# Patient Record
Sex: Male | Born: 1935 | Race: White | Hispanic: No | Marital: Married | Smoking: Former smoker
Health system: Southern US, Community
[De-identification: ages and names within clinical notes are randomized; demographics above are authoritative.]

## PROBLEM LIST (undated history)

## (undated) DIAGNOSIS — F4310 Post-traumatic stress disorder, unspecified: Secondary | ICD-10-CM

## (undated) DIAGNOSIS — I4891 Unspecified atrial fibrillation: Secondary | ICD-10-CM

## (undated) DIAGNOSIS — I714 Abdominal aortic aneurysm, without rupture, unspecified: Secondary | ICD-10-CM

## (undated) DIAGNOSIS — I10 Essential (primary) hypertension: Secondary | ICD-10-CM

## (undated) DIAGNOSIS — I951 Orthostatic hypotension: Secondary | ICD-10-CM

## (undated) DIAGNOSIS — I6529 Occlusion and stenosis of unspecified carotid artery: Secondary | ICD-10-CM

## (undated) DIAGNOSIS — I639 Cerebral infarction, unspecified: Secondary | ICD-10-CM

## (undated) DIAGNOSIS — I499 Cardiac arrhythmia, unspecified: Secondary | ICD-10-CM

## (undated) DIAGNOSIS — G709 Myoneural disorder, unspecified: Secondary | ICD-10-CM

## (undated) DIAGNOSIS — M4800 Spinal stenosis, site unspecified: Secondary | ICD-10-CM

## (undated) DIAGNOSIS — M549 Dorsalgia, unspecified: Secondary | ICD-10-CM

## (undated) DIAGNOSIS — M1990 Unspecified osteoarthritis, unspecified site: Secondary | ICD-10-CM

## (undated) DIAGNOSIS — Z531 Procedure and treatment not carried out because of patient's decision for reasons of belief and group pressure: Secondary | ICD-10-CM

## (undated) DIAGNOSIS — IMO0001 Reserved for inherently not codable concepts without codable children: Secondary | ICD-10-CM

## (undated) DIAGNOSIS — F329 Major depressive disorder, single episode, unspecified: Secondary | ICD-10-CM

## (undated) DIAGNOSIS — N289 Disorder of kidney and ureter, unspecified: Secondary | ICD-10-CM

## (undated) DIAGNOSIS — F32A Depression, unspecified: Secondary | ICD-10-CM

## (undated) DIAGNOSIS — F515 Nightmare disorder: Secondary | ICD-10-CM

## (undated) DIAGNOSIS — C801 Malignant (primary) neoplasm, unspecified: Secondary | ICD-10-CM

## (undated) DIAGNOSIS — E0590 Thyrotoxicosis, unspecified without thyrotoxic crisis or storm: Secondary | ICD-10-CM

## (undated) DIAGNOSIS — E7800 Pure hypercholesterolemia, unspecified: Secondary | ICD-10-CM

## (undated) DIAGNOSIS — G259 Extrapyramidal and movement disorder, unspecified: Secondary | ICD-10-CM

## (undated) DIAGNOSIS — F419 Anxiety disorder, unspecified: Secondary | ICD-10-CM

## (undated) DIAGNOSIS — E785 Hyperlipidemia, unspecified: Secondary | ICD-10-CM

## (undated) DIAGNOSIS — I38 Endocarditis, valve unspecified: Secondary | ICD-10-CM

## (undated) DIAGNOSIS — F0150 Vascular dementia without behavioral disturbance: Secondary | ICD-10-CM

## (undated) DIAGNOSIS — R413 Other amnesia: Secondary | ICD-10-CM

## (undated) DIAGNOSIS — H3530 Unspecified macular degeneration: Secondary | ICD-10-CM

## (undated) HISTORY — DX: Pure hypercholesterolemia, unspecified: E78.00

## (undated) HISTORY — DX: Cardiac arrhythmia, unspecified: I49.9

## (undated) HISTORY — DX: Thyrotoxicosis, unspecified without thyrotoxic crisis or storm: E05.90

## (undated) HISTORY — PX: CATARACT EXTRACTION W/ INTRAOCULAR LENS  IMPLANT, BILATERAL: SHX1307

## (undated) HISTORY — PX: EYE SURGERY: SHX253

## (undated) HISTORY — DX: Orthostatic hypotension: I95.1

## (undated) HISTORY — DX: Reserved for inherently not codable concepts without codable children: IMO0001

## (undated) HISTORY — PX: JOINT REPLACEMENT: SHX530

## (undated) HISTORY — DX: Cerebral infarction, unspecified: I63.9

## (undated) HISTORY — DX: Abdominal aortic aneurysm, without rupture, unspecified: I71.40

## (undated) HISTORY — DX: Essential (primary) hypertension: I10

## (undated) HISTORY — DX: Unspecified osteoarthritis, unspecified site: M19.90

## (undated) HISTORY — PX: FRACTURE SURGERY: SHX138

## (undated) HISTORY — DX: Disorder of kidney and ureter, unspecified: N28.9

## (undated) HISTORY — DX: Vascular dementia without behavioral disturbance: F01.50

## (undated) HISTORY — DX: Endocarditis, valve unspecified: I38

## (undated) HISTORY — DX: Abdominal aortic aneurysm, without rupture: I71.4

## (undated) HISTORY — DX: Extrapyramidal and movement disorder, unspecified: G25.9

## (undated) HISTORY — DX: Other amnesia: R41.3

## (undated) HISTORY — DX: Hyperlipidemia, unspecified: E78.5

## (undated) HISTORY — DX: Dorsalgia, unspecified: M54.9

## (undated) HISTORY — DX: Occlusion and stenosis of unspecified carotid artery: I65.29

## (undated) HISTORY — DX: Unspecified atrial fibrillation: I48.91

---

## 1999-06-06 HISTORY — PX: TOTAL KNEE ARTHROPLASTY: SHX125

## 2000-09-09 ENCOUNTER — Encounter: Admission: RE | Admit: 2000-09-09 | Discharge: 2000-09-09 | Payer: Self-pay | Admitting: Geriatric Medicine

## 2000-09-09 ENCOUNTER — Encounter: Payer: Self-pay | Admitting: Geriatric Medicine

## 2000-10-05 HISTORY — PX: ABDOMINAL AORTIC ANEURYSM REPAIR: SUR1152

## 2000-10-13 ENCOUNTER — Encounter: Admission: RE | Admit: 2000-10-13 | Discharge: 2000-10-13 | Payer: Self-pay | Admitting: Vascular Surgery

## 2000-10-13 ENCOUNTER — Encounter: Payer: Self-pay | Admitting: Vascular Surgery

## 2000-10-29 ENCOUNTER — Encounter: Payer: Self-pay | Admitting: Vascular Surgery

## 2000-11-01 ENCOUNTER — Ambulatory Visit (HOSPITAL_COMMUNITY): Admission: RE | Admit: 2000-11-01 | Discharge: 2000-11-01 | Payer: Self-pay | Admitting: Vascular Surgery

## 2000-11-03 ENCOUNTER — Encounter: Payer: Self-pay | Admitting: Vascular Surgery

## 2000-11-03 ENCOUNTER — Inpatient Hospital Stay (HOSPITAL_COMMUNITY): Admission: RE | Admit: 2000-11-03 | Discharge: 2000-11-08 | Payer: Self-pay | Admitting: Vascular Surgery

## 2000-11-03 ENCOUNTER — Encounter (INDEPENDENT_AMBULATORY_CARE_PROVIDER_SITE_OTHER): Payer: Self-pay | Admitting: Specialist

## 2000-11-04 ENCOUNTER — Encounter: Payer: Self-pay | Admitting: Vascular Surgery

## 2001-09-27 ENCOUNTER — Encounter: Payer: Self-pay | Admitting: Orthopedic Surgery

## 2001-09-27 ENCOUNTER — Ambulatory Visit (HOSPITAL_COMMUNITY): Admission: RE | Admit: 2001-09-27 | Discharge: 2001-09-27 | Payer: Self-pay | Admitting: Orthopedic Surgery

## 2001-10-05 HISTORY — PX: WRIST SURGERY: SHX841

## 2001-10-21 ENCOUNTER — Encounter: Admission: RE | Admit: 2001-10-21 | Discharge: 2001-10-21 | Payer: Self-pay | Admitting: Geriatric Medicine

## 2001-10-21 ENCOUNTER — Encounter: Payer: Self-pay | Admitting: Geriatric Medicine

## 2002-10-05 HISTORY — PX: CAROTID ENDARTERECTOMY: SUR193

## 2003-06-08 ENCOUNTER — Encounter: Payer: Self-pay | Admitting: Vascular Surgery

## 2003-06-13 ENCOUNTER — Ambulatory Visit (HOSPITAL_COMMUNITY): Admission: RE | Admit: 2003-06-13 | Discharge: 2003-06-13 | Payer: Self-pay | Admitting: Vascular Surgery

## 2003-06-18 ENCOUNTER — Encounter (INDEPENDENT_AMBULATORY_CARE_PROVIDER_SITE_OTHER): Payer: Self-pay | Admitting: *Deleted

## 2003-06-18 ENCOUNTER — Inpatient Hospital Stay (HOSPITAL_COMMUNITY): Admission: RE | Admit: 2003-06-18 | Discharge: 2003-06-19 | Payer: Self-pay | Admitting: Vascular Surgery

## 2006-12-04 HISTORY — PX: REPLACEMENT TOTAL KNEE: SUR1224

## 2006-12-08 ENCOUNTER — Inpatient Hospital Stay (HOSPITAL_COMMUNITY): Admission: RE | Admit: 2006-12-08 | Discharge: 2006-12-12 | Payer: Self-pay | Admitting: Orthopedic Surgery

## 2007-03-14 ENCOUNTER — Ambulatory Visit: Payer: Self-pay | Admitting: Vascular Surgery

## 2008-04-13 ENCOUNTER — Emergency Department (HOSPITAL_COMMUNITY): Admission: EM | Admit: 2008-04-13 | Discharge: 2008-04-13 | Payer: Self-pay | Admitting: Family Medicine

## 2008-06-01 ENCOUNTER — Ambulatory Visit: Payer: Self-pay | Admitting: Vascular Surgery

## 2008-12-07 ENCOUNTER — Ambulatory Visit: Payer: Self-pay | Admitting: Vascular Surgery

## 2009-04-08 ENCOUNTER — Emergency Department (HOSPITAL_COMMUNITY): Admission: EM | Admit: 2009-04-08 | Discharge: 2009-04-08 | Payer: Self-pay | Admitting: Emergency Medicine

## 2009-04-26 ENCOUNTER — Inpatient Hospital Stay (HOSPITAL_COMMUNITY): Admission: RE | Admit: 2009-04-26 | Discharge: 2009-04-29 | Payer: Self-pay | Admitting: Orthopedic Surgery

## 2009-05-10 ENCOUNTER — Emergency Department (HOSPITAL_COMMUNITY): Admission: EM | Admit: 2009-05-10 | Discharge: 2009-05-10 | Payer: Self-pay | Admitting: Emergency Medicine

## 2009-05-31 ENCOUNTER — Ambulatory Visit: Payer: Self-pay | Admitting: Vascular Surgery

## 2009-11-22 ENCOUNTER — Ambulatory Visit: Payer: Self-pay | Admitting: Vascular Surgery

## 2010-11-25 ENCOUNTER — Other Ambulatory Visit (INDEPENDENT_AMBULATORY_CARE_PROVIDER_SITE_OTHER): Payer: Federal, State, Local not specified - PPO

## 2010-11-25 DIAGNOSIS — Z48812 Encounter for surgical aftercare following surgery on the circulatory system: Secondary | ICD-10-CM

## 2010-11-25 DIAGNOSIS — I6529 Occlusion and stenosis of unspecified carotid artery: Secondary | ICD-10-CM

## 2010-12-02 NOTE — Procedures (Unsigned)
CAROTID DUPLEX EXAM  INDICATION:  Follow up right carotid disease, left CEA.  HISTORY: Diabetes:  No. Cardiac:  No. Hypertension:  Yes. Smoking:  Previous. Previous Surgery:  Left CEA 06/17/2005. CV History: Amaurosis Fugax No, Paresthesias No, Hemiparesis No                                      RIGHT             LEFT Brachial systolic pressure:         138               142 Brachial Doppler waveforms:         WNL               WNL Vertebral direction of flow:        Antegrade         Antegrade DUPLEX VELOCITIES (cm/sec) CCA peak systolic                   81                56 ECA peak systolic                   75                128 ICA peak systolic                   210               37 ICA end diastolic                   51                12 PLAQUE MORPHOLOGY:                  Heterogeneous     NA PLAQUE AMOUNT:                      Moderate          Minimal PLAQUE LOCATION:                    ICA  IMPRESSION: 1. High-end, 40% to 59% right internal carotid artery stenosis. 2. Widely patent left carotid endarterectomy without evidence of     restenosis or hyperplasia. 3. Bilateral vertebral arteries are within normal limits. 4. There has been a downgrade on the right side classification of     stenosis; however, velocities appear stable.  ___________________________________________ Larina Earthly, M.D.  LT/MEDQ  D:  11/25/2010  T:  11/25/2010  Job:  [id number]

## 2011-01-10 LAB — URINALYSIS, ROUTINE W REFLEX MICROSCOPIC
Glucose, UA: NEGATIVE mg/dL
Hgb urine dipstick: NEGATIVE
Specific Gravity, Urine: 1.015 (ref 1.005–1.030)
Urobilinogen, UA: 0.2 mg/dL (ref 0.0–1.0)
pH: 6 (ref 5.0–8.0)

## 2011-01-10 LAB — CBC
HCT: 32.3 % — ABNORMAL LOW (ref 39.0–52.0)
MCHC: 33.2 g/dL (ref 30.0–36.0)
MCV: 90.1 fL (ref 78.0–100.0)
Platelets: 376 10*3/uL (ref 150–400)
RBC: 3.58 MIL/uL — ABNORMAL LOW (ref 4.22–5.81)
WBC: 7.9 10*3/uL (ref 4.0–10.5)

## 2011-01-10 LAB — POCT I-STAT, CHEM 8
Calcium, Ion: 1.17 mmol/L (ref 1.12–1.32)
Creatinine, Ser: 1.3 mg/dL (ref 0.4–1.5)
Glucose, Bld: 112 mg/dL — ABNORMAL HIGH (ref 70–99)
HCT: 33 % — ABNORMAL LOW (ref 39.0–52.0)
Hemoglobin: 11.2 g/dL — ABNORMAL LOW (ref 13.0–17.0)
TCO2: 25 mmol/L (ref 0–100)

## 2011-01-10 LAB — PROTIME-INR
INR: 2.3 — ABNORMAL HIGH (ref 0.00–1.49)
Prothrombin Time: 25.2 seconds — ABNORMAL HIGH (ref 11.6–15.2)

## 2011-01-10 LAB — DIFFERENTIAL
Basophils Relative: 0 % (ref 0–1)
Eosinophils Absolute: 0.2 10*3/uL (ref 0.0–0.7)
Eosinophils Relative: 2 % (ref 0–5)
Lymphs Abs: 0.7 10*3/uL (ref 0.7–4.0)
Monocytes Relative: 6 % (ref 3–12)

## 2011-01-11 LAB — BASIC METABOLIC PANEL
CO2: 29 mEq/L (ref 19–32)
Chloride: 104 mEq/L (ref 96–112)
GFR calc Af Amer: >60 mL/min (ref >60)
GFR calc non Af Amer: >60 mL/min (ref >60)
Glucose, Bld: 98 mg/dL (ref 70–99)
Potassium: 3.5 mEq/L (ref 3.5–5.1)
Potassium: 4.4 mEq/L (ref 3.5–5.1)
Sodium: 140 mEq/L (ref 135–145)

## 2011-01-11 LAB — CBC
HCT: 25.5 % — ABNORMAL LOW (ref 39.0–52.0)
HCT: 27.4 % — ABNORMAL LOW (ref 39.0–52.0)
HCT: 32.3 % — ABNORMAL LOW (ref 39.0–52.0)
Hemoglobin: 10.7 g/dL — ABNORMAL LOW (ref 13.0–17.0)
Hemoglobin: 13.5 g/dL (ref 13.0–17.0)
Hemoglobin: 9.3 g/dL — ABNORMAL LOW (ref 13.0–17.0)
MCHC: 33.9 g/dL (ref 30.0–36.0)
MCV: 88.8 fL (ref 78.0–100.0)
MCV: 88.9 fL (ref 78.0–100.0)
Platelets: 125 10*3/uL — ABNORMAL LOW (ref 150–400)
RBC: 3.64 MIL/uL — ABNORMAL LOW (ref 4.22–5.81)
RBC: 4.61 MIL/uL (ref 4.22–5.81)
RDW: 14.6 % (ref 11.5–15.5)
RDW: 14.6 % (ref 11.5–15.5)
WBC: 10.6 10*3/uL — ABNORMAL HIGH (ref 4.0–10.5)
WBC: 5.9 10*3/uL (ref 4.0–10.5)
WBC: 7.4 10*3/uL (ref 4.0–10.5)

## 2011-01-11 LAB — NO BLOOD PRODUCTS

## 2011-01-11 LAB — PROTIME-INR
Prothrombin Time: 14.4 seconds (ref 11.6–15.2)
Prothrombin Time: 14.4 seconds (ref 11.6–15.2)

## 2011-01-11 LAB — URINALYSIS, ROUTINE W REFLEX MICROSCOPIC
Glucose, UA: NEGATIVE mg/dL
Hgb urine dipstick: NEGATIVE
Protein, ur: NEGATIVE mg/dL
Specific Gravity, Urine: 1.009 (ref 1.005–1.030)

## 2011-01-11 LAB — COMPREHENSIVE METABOLIC PANEL
ALT: 17 U/L (ref 0–53)
Alkaline Phosphatase: 87 U/L (ref 39–117)
Chloride: 108 mEq/L (ref 96–112)
Glucose, Bld: 90 mg/dL (ref 70–99)
Potassium: 4.1 mEq/L (ref 3.5–5.1)
Sodium: 139 mEq/L (ref 135–145)
Total Bilirubin: 0.9 mg/dL (ref 0.3–1.2)
Total Protein: 6.7 g/dL (ref 6.0–8.3)

## 2011-02-17 NOTE — Assessment & Plan Note (Signed)
OFFICE VISIT   [name] [name], [name] [name]  DOB:  1923-08-26                                       06/01/2008  BJYNW#:[id number]   Patient presents today for continued followup of his asymptomatic  carotid disease.  He is status post left carotid endarterectomy in  September, 2006.  He is his usual pleasant, animated self, and he denies  any new medical difficulties.  He does have some difficulty with his  right knee and is considering surgery on this.  He has had successful  treatment for left knee discomfort.  He specifically denies any  amaurosis fugax, transient ischemic attack, or stroke.   PHYSICAL EXAMINATION:  Blood pressure 138/71, pulse 81, respirations 18.  He is grossly intact neurologically.  His radial pulses are 2+  bilaterally.  He has a well-healed left carotid incision with no bruits,  and he has no bruits on his right carotid as well.   He underwent carotid duplex evaluation in our office.  This reveals a  widely patent endarterectomy on the left, and no significant stenosis of  his moderate-to-severe stenosis in his right carotid system.  He has a  60-79% narrowing.   I again discussed symptoms of carotid disease with the patientwho I  advised to notify us should this occur, otherwise we will see him again  in one year with continued carotid duplex surveillance.   Larina Earthly, M.D.  Electronically Signed   TFE/MEDQ  D:  06/01/2008  T:  06/04/2008  Job:  [id number]

## 2011-02-17 NOTE — Discharge Summary (Signed)
NAME:  MONICO, SUDDUTH NO.:  [id number]   MEDICAL RECORD NO.:  [id number]          PATIENT TYPE:  INP   LOCATION:  1608                         FACILITY:  [facility]   PHYSICIAN:  Ollen Gross, M.D.    DATE OF BIRTH:  10-30-1922   DATE OF ADMISSION:  04/26/2009  DATE OF DISCHARGE:  04/29/2009                               DISCHARGE SUMMARY   ADMITTING DIAGNOSES:  1. Osteoarthritis, right knee.  2. Tinnitus.  3. Vertigo.  4. Early macular degeneration.  5. Anxiety.  6. Depression.  7. Hypertension.  8. Hypercholesterolemia.  9. Abdominal aortic aneurysm which has been repaired.  10.Childhood illnesses of measles and mumps.  11.Hemorrhoids.  12.Carotid arterial disease, status post left carotid surgery.   DISCHARGE DIAGNOSES:  1. Osteoarthritis, right knee, status post right total knee      replacement arthroplasty.  2. Postoperative acute blood loss anemia, did not require transfusion.  3. Postoperative hyponatremia, improved.  4. Tinnitus.  5. Vertigo.  6. Early macular degeneration.  7. Anxiety.  8. Depression.  9. Hypertension.  10.Hypercholesterolemia.  11.Abdominal aortic aneurysm which has been repaired.  12.Childhood illnesses of measles and mumps.  13.Hemorrhoids.  14.Carotid arterial disease, status post left carotid surgery.   PROCEDURE:  April 26, 2009, right total knee.  Surgeon, Dr. Lequita Halt.  Assistant, Avel Peace, P.A.-C.  Spinal anesthesia.  [place]et time, 38  minutes.   CONSULTS:  None.   BRIEF HISTORY:  Mr. Lipscomb is an 86-year male with end-stage arthritis  of the right knee, progressive worsening pain and dysfunction, failed  nonoperative management, now presents for total knee arthroplasty.   LABORATORY DATA:  Preoperative CBC showed a hemoglobin of 13.5,  hematocrit of 41.0, white cell count 7.4, platelets 173.  Postoperative  hemoglobin 10.7 then 9.3, last noted hemoglobin and hematocrit 8.6 and  25.5.  PT/PTT preoperative  14.6 and 1.1 with PTT of 31.  Serial pro  times followed per Coumadin protocol.  Last noted PT/INR 19.1 and 1.5.  Chem panel on admission all within normal limits.  Serial BMETs were  followed.  Sodium dropped from 139 to 134 back up to 140.  Remaining  BMET within normal limits.  Preoperative UA negative.   EKG, April 24, 2009, sinus rhythm with premature atrial complexes  confirmed by Dr. Lady Deutscher.  Two-view chest, April 24, 2009, chronic  lung markings, atherosclerosis noted, mild cardiomegaly, no acute  process evident.   HOSPITAL COURSE:  Patient admitted to [facility], taken to the  OR, underwent above-stated procedure without complication.  Patient  tolerated procedure well and later transferred to recovery room,  orthopedic floor.  Started on PCA and p.o. analgesics within 24 hours  postoperative IV antibiotics and doing pretty well on the morning of day  1.  Hemoglobin was down a little bit but hemodynamically stable.  Hemovac drain placed __________ without difficulty.  Started getting up  out of bed with therapy.  On day 1, patient was up walking about 18  feet.  By day 2, the dressing change and incision looked good and he had  progressed  with his therapy up to about 65 feet.  Hemoglobin was down a  little bit further at 9.3 but he was asymptomatic with this and  hemodynamically stable.  Continued to progress well with therapy.  Seen  on the morning of day 3.  He was doing better, meeting his goals,  tolerating his medications.  Hemoglobin was low but he was asymptomatic.  Placed him on iron supplementation and discharged home.   DISCHARGE PLAN:  1. Patient was discharged home on April 29, 2009.  2. Discharge diagnoses:  Please see above.  3. Discharge medications:  Vicodin, Robaxin, Nu-Iron, Coumadin.   FOLLOWUP:  Two weeks.   ACTIVITY:  Weightbearing as tolerated, total knee protocol.   DISPOSITION:  Home.   CONDITION UPON DISCHARGE:  Improved.       Alexzandrew L. Perkins, P.A.C.      Ollen Gross, M.D.  Electronically Signed    ALP/MEDQ  D:  05/30/2009  T:  05/30/2009  Job:  [id number]   cc:   Ollen Gross, M.D.  Fax: [phone]

## 2011-02-17 NOTE — Procedures (Signed)
CAROTID DUPLEX EXAM   INDICATION:  Followup carotid artery disease.   HISTORY:  Diabetes:  No.  Cardiac:  No.  Hypertension:  Yes.  Smoking:  Quit.  Previous Surgery:  Left CEA with primary closure on 06/17/2005 by Dr.  Arbie Cookey.  CV History:  No.  Amaurosis Fugax No, Paresthesias No, Hemiparesis No                                       RIGHT             LEFT  Brachial systolic pressure:         160               154  Brachial Doppler waveforms:         Triphasic         Triphasic  Vertebral direction of flow:        Antegrade         Antegrade  DUPLEX VELOCITIES (cm/sec)  CCA peak systolic                   66                71  ECA peak systolic                   213               154  ICA peak systolic                   348               77  ICA end diastolic                   79                18  PLAQUE MORPHOLOGY:                  Calcified         Homogenous  PLAQUE AMOUNT:                      Moderate/severe   Mild  PLAQUE LOCATION:                    ICA/ECA           ICA/ECA   IMPRESSION:  1. Right ICA shows evidence of 60-79% stenosis, an increase in      velocity, however no change in category.  2. Left ICA shows evidence of 1-39% stenosis status post CEA.  3. Right ECA stenosis.   ___________________________________________  Larina Earthly, M.D.   AS/MEDQ  D:  06/01/2008  T:  06/01/2008  Job:  [id number]

## 2011-02-17 NOTE — Consult Note (Signed)
NAME:  [name] [name], [name] [name] NO.:  [id number]   MEDICAL RECORD NO.:  [id number]          PATIENT TYPE:  EMS   LOCATION:  MAJO                         FACILITY:  [facility]   PHYSICIAN:  Lonia Blood, M.D.       DATE OF BIRTH:  May 12, 1923   DATE OF CONSULTATION:  05/10/2009  DATE OF DISCHARGE:                                 CONSULTATION   CHIEF COMPLAINT:  Passed out.   HISTORY OF PRESENT ILLNESS:  Mr. Guice is an 77 year old gentleman  with obvious atherosclerosis who was going for a follow-up after total  knee replacement to Dr. Tana Felts office when he got dizzy in the parking  lot and fell backwards, hitting his head.  The patient did not have any  seizure activity and recovered fairly quickly and was brought to the  emergency room where he remained stable throughout evaluation.  He  denies any preceding chest pain or shortness of breath and currently he  says he feels fine and wants to go home.   PAST MEDICAL HISTORY:  1. Hypertension.  2. Abdominal aortic aneurysm, status post repair in 2002.  3. Carotid artery disease, status post left endarterectomy in 2004.  4. Right ICA stenosis.  5. Osteoarthritis.  6. Macular degeneration.  7. Anxiety, depression.   HOME MEDICATIONS:  1. Norvasc 5 mg daily.  2. Lasix 40 mg daily.  3. Vytorin 10-80 mg daily.  4. Coumadin 5 mg daily.  5. Robaxin 500 mg as needed for muscle spasm.  6. Vicodin 5/325 as needed for pain.  7. Altace 5 mg daily.  8. Iron 150 mg twice a day.  9. Lexapro 20 mg daily.   ALLERGIES:  NO KNOWN DRUG ALLERGIES.   FAMILY HISTORY:  The patient's father died with a myocardial infarction.  Mother died with stroke and myocardial infarction.  Brother died with  myocardial infarction.  Sister died with myocardial infarction.   SOCIAL HISTORY:  The patient is married.  Does not smoke cigarettes,  does not drink alcohol.   REVIEW OF SYSTEMS:  As per HPI.  All other systems reviewed and  negative.   PHYSICAL EXAM:  VITAL SIGNS:  Upon admission his temperature is 97.0,  heart rate 67, blood pressure 99/60, respiratory rate 17, saturation 99%  on room air.  GENERAL APPEARANCE:  Is one of an elderly gentleman that is awake,  alert, in no acute distress.  HEENT:  Head has an abrasion on the left occipital region but without  any clear skin tear.  Otherwise he is normocephalic.  EYES:  Pupils  round, react to light and accommodation.  Extraocular movements intact.  Throat clear.  NECK:  Supple, no  JVD.  Examination of the neck reveals a right-sided  carotid bruit.  CHEST:  Clear to auscultation without wheezes, rhonchi or crackles.  HEART:  Regular without murmurs, rubs or gallops.  ABDOMEN:  Soft, nontender and nondistended.  Bowel sounds are present.  EXTREMITIES:  Right-knee incision is healed.  NEUROLOGICAL EXAM:  Cranial nerves II through XII are intact.  Strength  5/5 in the four extremities.  Sensation intact.   LABORATORY VALUES:  On admission white blood cell count 7.9, hemoglobin  10.7, platelet count 376.  Sodium 140, potassium 3.9, chloride 104,  bicarbonate 25, BUN 24, creatinine 1.3, glucose 112, INR is 2.3.  Cardiac point of markers are within normal limits.  CT head and spine  are within normal limits.  EKG shows normal sinus rhythm, no ST-T  changes, question old inferior MI.   ASSESSMENT/PLAN:  This is an 77 year old gentleman with obvious syncopal  event, which is unclear in etiology but I wonder if it was precipitated  by the heat and hypotension as the patient probably has some  postoperative anemia and is on three antihypertensives.  The patient is  obviously at high risk for ventricular arrhythmias due to his  atherosclerosis, presumed coronary artery disease and advanced age.  Mr.  Darko has been urged to stay in the hospital to complete the syncopal  evaluation, but he has been firmly refusing.  His wife, present in the  room, confirms that there is no  way we can change this gentleman's mind.  The patient was asked to continue his Coumadin and hold the Lasix,  Altace and Norvasc and a follow-up was arranged with [facility]  with Dr. Donato Schultz on May 15, 2009 at 10:00 a.m. The patient's case  was discussed with requesting physician for the consult, Dr. Renato Battles, III.      Lonia Blood, M.D.  Electronically Signed     SL/MEDQ  D:  05/10/2009  T:  05/10/2009  Job:  [id number]   cc:   Jake Bathe, MD  Hal T. Stoneking, M.D.

## 2011-02-17 NOTE — H&P (Signed)
NAME:  [name] [name], [name] [name] NO.:  [id number]   MEDICAL RECORD NO.:  [id number]          PATIENT TYPE:  INP   LOCATION:  NA                           FACILITY:  [facility]   PHYSICIAN:  Ollen Gross, M.D.    DATE OF BIRTH:  Jun 21, 1923   DATE OF ADMISSION:  04/26/2009  DATE OF DISCHARGE:                              HISTORY & PHYSICAL   DATE OF OFFICE VISIT/HISTORY AND PHYSICAL:  March 29, 2009.   DATE OF ADMISSION:  April 26, 2009.   CHIEF COMPLAINT:  Right knee pain.   HISTORY OF PRESENT ILLNESS:  The patient is an 77 year old male who has  ongoing progressive right knee arthritis seen by Dr. Lequita Halt.  Felt to  be a good candidate for knee replacement.  He has had a previous left  total knee done back in March 2008, doing well, now presents for the  other side.   ALLERGIES:  NO KNOWN DRUG ALLERGIES.   CURRENT MEDICATIONS:  Altace, Norvasc, cyclobenzaprine, Vytorin,  Lexapro, aspirin.   PAST MEDICAL HISTORY:  Tinnitus, vertigo, early macular degeneration,  anxiety, depression, hypertension, hypercholesterolemia, abdominal  aortic aneurysm, which has been repaired, childhood illnesses of  measles, mumps, hemorrhoids, carotid arterial disease, status post left  carotid surgery.   PAST SURGICAL HISTORY:  Abdominal aortic aneurysm repair January 2002,  left carotid arterial surgery, September 2004, wrist surgery 2003, left  total knee replacement, 2008.   FAMILY HISTORY:  Father deceased, 65, with heart attack.  Mother,  deceased age 35, stroke, arthritis, heart attack and hypertension.  Brother with heart attack.  Sister with heart attack.  Aunt with  diabetes.   SOCIAL HISTORY:  Married, retired nonsmoker.  Two to three beers a day.  Occasional glass of wine a day.  Five children.   REVIEW OF SYSTEMS:  GENERAL:  Occasional fevers, chills, and loss of  memory.  NEURO:  He has had some dizziness, a history of vertigo, and  tinnitus.  No seizures, syncope.   RESPIRATORY:  No shortness of breath,  productive cough, or hemoptysis.  CARDIOVASCULAR:  No chest pain,  angina, or orthopnea.  GI:  Some constipation.  No nausea, vomiting, or  diarrhea.  GU:  No dysuria, hematuria, or discharge.  MUSCULOSKELETAL:  Knee pain.   PHYSICAL EXAMINATION:  VITAL SIGNS:  Pulse 72, respirations 14, blood  pressure 122/50.  GENERAL: An 77 year old white male well-nourished, well-developed, no  acute distress.  He is alert and cooperative, pleasant accompanied by  his wife.  HEENT: Normocephalic, atraumatic.  Pupils are reactive.  EOMs intact.  No glasses.  Has dentures.  NECK: Supple with a faint left and +1 right carotid bruit.  CHEST: Clear anterior posterior chest walls.  No rhonchi, rales or  wheezing.  HEART: Regular rate and rhythm with a grade 2-3/6 systolic ejection  murmur best heard over aortic, pulmonic, and Erb's point.  ABDOMEN:  Soft, slightly round.  Bowel sounds present.  RECTAL/BREAST/GENITALIA:  Not done, not pertinent to present illness.  EXTREMITIES:  Right knee:  Range of motion 5/125, marked crepitus tender  more medial than lateral.  No instability.   IMPRESSION:  Osteoarthritis, right knee.   PLAN:  The patient was admitted to [facility]  to undergo  right total knee replacement arthroplasty.  Due to religious reasons,  the patient does not want to receive any blood, but he does say that  other components such as the CellSaver, the Autovac, and the FloSeal are  usable.      Alexzandrew L. Perkins, P.A.C.      Ollen Gross, M.D.  Electronically Signed    ALP/MEDQ  D:  04/25/2009  T:  04/25/2009  Job:  [id number]

## 2011-02-17 NOTE — Assessment & Plan Note (Signed)
OFFICE VISIT   [name] [name], [name] [name]  DOB:  March 14, 1923                                       11/22/2009  LKGMW#:[id number]   Patient presents today for continued follow-up of his cerebrovascular  occlusive disease.  He is status post resection of abdominal aortic  aneurysm with myself in 2002 and a subsequent left carotid  endarterectomy in 2004.  He remains quite active at his age of 22.  He  does not have any neurologic deficits.  He does have some concern  regarding a thickening around the level of his scar and his umbilicus.  He does not have any specific pain related to this.   His past history is otherwise unchanged.  He has had knee replacements  but does not have any cardiac disease.  He is hypertensive.  He does not  smoke, having quit in the distant past.   PHYSICAL EXAMINATION:  A well-nourished white male appearing his stated  age of 89.  He is grossly intact neurologically with no focal deficits.  His extremities are without deformity or cyanosis.  Skin is without  rashes.  His carotid incision is well healed on the left.  He has no  bruits.  Abdominal exam reveals no evidence of hernia.  He does have  some slight thickening in the area of his scar at the umbilicus, which  is of no consequence.   He did undergo noninvasive vascular laboratory studies in our office  today, and this reveals a widely patent endarterectomy on his left  carotid.  He does have moderate-to-severe right carotid stenosis, and  this has not progressed.  I discussed this at length with patient and  his wife present, and I have suggested that we see them again in 1 year  with repeat carotid duplex for continued follow-up of his asymptomatic  right carotid stenosis.  He will notify us should he develop any  difficulty in the interim.     Larina Earthly, M.D.  Electronically Signed   TFE/MEDQ  D:  11/22/2009  T:  11/26/2009  Job:  [id number]   cc:   Hal T. Stoneking, M.D.

## 2011-02-17 NOTE — Procedures (Signed)
CAROTID DUPLEX EXAM   INDICATION:  Carotid disease.   HISTORY:  Diabetes:  No.  Cardiac:  No.  Hypertension:  Yes.  Smoking:  Previous.  Previous Surgery:  Left carotid endarterectomy on 06/17/2005.  CV History:  Occasional dizziness, generally asymptomatic.  Amaurosis Fugax No, Paresthesias No, Hemiparesis No.                                       RIGHT             LEFT  Brachial systolic pressure:         122               126  Brachial Doppler waveforms:         Normal            Normal  Vertebral direction of flow:        Antegrade         Antegrade  DUPLEX VELOCITIES (cm/sec)  CCA peak systolic                   68                66  ECA peak systolic                   115               121  ICA peak systolic                   303               59  ICA end diastolic                   49                18  PLAQUE MORPHOLOGY:                  Calcific          Mixed  PLAQUE AMOUNT:                      Moderate/severe   Mild  PLAQUE LOCATION:                    ICA/ECA/CCA       ECA/CCA   IMPRESSION:  1. Low-end 60% to 79% stenosis of the right internal carotid artery.  2. Patent left carotid endarterectomy site with no evidence of      internal carotid artery stenosis.  3. No significant change in the Doppler velocities noted when compared      to the previous exam on 12/07/2008.   ___________________________________________  Larina Earthly, M.D.   CH/MEDQ  D:  05/31/2009  T:  05/31/2009  Job:  [id number]

## 2011-02-17 NOTE — Procedures (Signed)
CAROTID DUPLEX EXAM   INDICATION:  Carotid disease.   HISTORY:  Diabetes:  No.  Cardiac:  No.  Hypertension:  Yes.  Smoking:  Previous.  Previous Surgery:  Left carotid endarterectomy on 06/17/2005.  CV History:  Occasional dizziness, generally asymptomatic.  Amaurosis Fugax No, Paresthesias No, Hemiparesis No                                       RIGHT             LEFT  Brachial systolic pressure:         142               144  Brachial Doppler waveforms:         Normal            Normal  Vertebral direction of flow:        Antegrade         Antegrade  DUPLEX VELOCITIES (cm/sec)  CCA peak systolic                   56                65  ECA peak systolic                   134               93  ICA peak systolic                   232               73  ICA end diastolic                   32                15  PLAQUE MORPHOLOGY:                  Mixed             Mixed  PLAQUE AMOUNT:                      Moderate          Mild  PLAQUE LOCATION:                    ICA / ECA / CCA   ECA / CCA   IMPRESSION:  1. Low end 60%-79% stenosis of the left internal carotid artery.  2. Patent left carotid endarterectomy site with no left internal      carotid artery stenosis.  3. No significant change noted when compared to the previous exam on      05/31/2009.   ___________________________________________  Larina Earthly, M.D.   CH/MEDQ  D:  11/22/2009  T:  11/23/2009  Job:  [id number]

## 2011-02-17 NOTE — Op Note (Signed)
NAME:  [name], [name] NO.:  [id number]   MEDICAL RECORD NO.:  [id number]          PATIENT TYPE:  INP   LOCATION:  0003                         FACILITY:  [facility]   PHYSICIAN:  Ollen Gross, M.D.    DATE OF BIRTH:  1922-11-28   DATE OF PROCEDURE:  04/26/2009  DATE OF DISCHARGE:                               OPERATIVE REPORT   PREOPERATIVE DIAGNOSIS:  Osteoarthritis, right knee.   POSTOPERATIVE DIAGNOSIS:  Osteoarthritis, right knee.   PROCEDURE:  Right total knee arthroplasty.   SURGEON:  Ollen Gross, M.D.   ASSISTANT:  Avel Peace, P.A.-C.   ANESTHESIA:  Spinal.   ESTIMATED BLOOD LOSS:  Minimal.   DRAINS:  Hemovac x1.   TOURNIQUET TIME:  38 minutes at 300 mmHg.   COMPLICATIONS:  None.   CONDITION:  Stable to recovery.   CLINICAL NOTE:  [name] [name] is an 77 year old male with end-stage  arthritis of the right knee with progressively worsening pain and  dysfunction.  He has failed nonoperative management and presents now for  right total knee arthroplasty.   PROCEDURE IN DETAIL:  After successful administration of spinal  anesthetic, a tourniquet was placed on his right thigh, and his right  lower extremity was prepped and draped in the usual sterile fashion.  Extremity was wrapped in Esmarch, knee flexed, and tourniquet inflated  to 300 mmHg.  Midline incision was made with a 10 blade through  subcutaneous tissue to the level of the extensor mechanism.  A fresh  blade was used make a medial parapatellar arthrotomy.  Soft tissue on  the proximal medial tibia subperiosteally elevated to the joint line  with the knife into the semimembranosus bursa with a Cobb elevator.  Soft tissue laterally is elevated with attention being paid to avoiding  the patellar tendon on tibial tubercle.  The patella subluxed laterally,  knee flexed 90 degrees, and ACL and PCL removed.  Drill was used create  a starting hole in the distal femur, and the canal was  thoroughly  irrigated.  The 5-degree right valgus alignment guide was placed and  referencing off posterior condyles rotations marked and the block pinned  to remove 10 mm off the distal femur.  Distal femoral resection was made  with an oscillating saw.  Sizing blocks placed, and size four was most  appropriate.  Rotations marked off the epicondylar axis.  Size 4 cutting  block was placed, and the anterior, posterior and chamfer cuts were  made.   Tibia subluxed forward and the menisci were removed.  Extramedullary  tibial alignment guide was placed referencing proximally at the medial  aspect of the tibial tubercle and distally along the second metatarsal  axis and tibial crest.  The block was pinned to remove about 2 mm off  the more deficient medial side.  Tibial resection was made with an  oscillating saw.  Size 4 was the most appropriate tibial component, and  the proximal tibia was prepared the modular drill and keel punch for the  size 4.  Femoral preparation was completed with the intercondylar cut.   Size 4  mobile bearing tibial trial, size 4 posterior stabilized femoral  trial and a 12.5-mm posterior stabilized rotating platform insert trial  were placed.  With the 12.5, full extensions achieved with excellent  varus-valgus and anterior-posterior balance throughout full range of  motion.  Patella was everted and thickness measured to be 23 mm.  Freehand resection was taken to 13 mm, 38 template was placed, lug holes  were drilled, trial patella was placed and it tracked normally.  Osteophytes were removed off the posterior femur with the trial in  place.  All trials were removed, and the cut bone surfaces were prepared  with pulsatile lavage.  Cement was mixed, and once ready for  implantation, a size 4 mobile bearing tibial tray, size 4 posterior  stabilized femur and 38 patella were cemented into place and patella was  held with a clamp.  Trial 12.5-mm insert was placed,  knee held in full  extension and all extruded cement removed.  When the cement was fully  hardened, then the permanent 12.5-mm posterior stabilized rotating  platform insert was placed into the tibial tray.  Wound was copiously  irrigated with saline solution, and then the arthrotomy closed over a  Hemovac drain with interrupted #1 PDS.  Flexion against gravity about  140 degrees.  Tourniquet was then released for total time of 38 minutes.  Subcutaneous tissue was closed with interrupted 2-0 Vicryl and  subcuticular running 4-0 Monocryl.  Incision was cleaned and dried, and  Steri-Strips and bulky sterile dressing were applied.  He was then  placed into a knee immobilizer, awakened and transported to recovery in  stable condition.      Ollen Gross, M.D.  Electronically Signed     FA/MEDQ  D:  04/26/2009  T:  04/26/2009  Job:  [id number]

## 2011-02-17 NOTE — Procedures (Signed)
CAROTID DUPLEX EXAM   INDICATION:  Follow-up evaluation of known carotid artery disease.   HISTORY:  Diabetes:  No.  Cardiac:  No.  Hypertension:  Yes.  Smoking:  Patient is a former smoker.  Previous Surgery:  Left carotid endarterectomy with primary closure on  06/17/05 by Dr. Arbie Cookey.  CV History:  Previous duplex on 06/01/08 revealed 60-79% right ICA  stenosis and 1-39% left ICA stenosis, status post endarterectomy.  Amaurosis Fugax No, Paresthesias No, Hemiparesis No.                                       RIGHT             LEFT  Brachial systolic pressure:         116               120  Brachial Doppler waveforms:         Triphasic         Triphasic  Vertebral direction of flow:        Antegrade         Antegrade  DUPLEX VELOCITIES (cm/sec)  CCA peak systolic                   67                69  ECA peak systolic                   166               183  ICA peak systolic                   323               92  ICA end diastolic                   73                31  PLAQUE MORPHOLOGY:                  Calcified, irregular                Mixed  PLAQUE AMOUNT:                      Moderate          Mild  PLAQUE LOCATION:                    Proximal ICA, CCA Proximal ICA, ECA   IMPRESSION:  1. 60-79% right internal carotid artery stenosis.  2. 20-39% left internal carotid artery stenosis, status post      endarterectomy.  3. No significant change from previous study performed on 06/01/08.   ___________________________________________  Larina Earthly, M.D.   MC/MEDQ  D:  12/07/2008  T:  12/07/2008  Job:  [id number]

## 2011-02-20 NOTE — H&P (Signed)
[place]  Patient:    [name] [name], [name] [name]                       MRN: [id number] Adm. Date:  11/03/00 Attending:  Larina Earthly, M.D. Dictator:   Marlowe Kays, P.A. CC:         Hal T. Stoneking, M.D.   History and Physical  DATE OF BIRTH:  1923/08/21  CHIEF COMPLAINT:  Abdominal aortic aneurysm.  HISTORY OF PRESENT ILLNESS:  [name] [name] is a pleasant 77 year old white male referred by Dr. Pete Glatter for evaluation of abdominal aortic aneurysm. The patient presented with back pain to the primary care physician. Ultrasound revealed a 4.5 cm abdominal aortic aneurysm.  A followup CT in January 2002, revealed an increase of this aneurysm, now showing a diameter of 5.2 cm, with no evidence of leak.  Dr. Arbie Cookey recommended to proceed with surgery, although he first will have to undergo an abdominal aortogram, to determine if the patient is a candidate for stent repair.  He denies any abdominal pain at the moment, he denies any nausea, vomiting, or hematochezia. He has chronic constipation.  No back pain.  No hematemesis or claudication symptoms, although he experiences bilateral hip pain with ambulation.  No peripheral edema.  No dysuria or hematuria.  No GERD symptoms.  No shortness of breath or dyspnea on exertion.  The patient is scheduled for surgery, on November 03, 2000.  PAST MEDICAL HISTORY: 1. Abdominal aortic aneurysm. 2. Jehovah Witness. 3. History of carotid artery disease, followed up on ultrasound. 4. Hypertension. 5. Decreased hearing. 6. Benign prostatic hypertrophy. 7. Previous history of pneumonia. 8. Obesity.  PAST SURGICAL HISTORY:  None.  MEDICATIONS: 1. Hydrochlorothiazide 25 mg p.o. q.d. 2. Vioxx p.o. q.d. p.r.n. 3. Norvasc 5 mg p.o. q.d.  ALLERGIES:  No known drug allergies.  REVIEW OF SYSTEMS:  See HPI and past medical history for significant positives, otherwise, no diabetes, kidney disease, TIA, CVA, or ______  fugax. No coronary artery disease.  FAMILY HISTORY:  Mother died at 12 of stroke, father died at 17 of myocardial infarction.  He also had a history of kidney disease.  One sister died at 49 of complications of Parkinsons disease, she also had a history of hypertension and gastrointestinal bleed.  Brother died at 37 of stroke.  SOCIAL HISTORY:  The patient is married, he has four children in good health. He is a retired Curator.  He quit in 1969 one pack a day of cigarettes, which he smoked for about 30 years.  He drinks about a 12 pack of beer a week.  PHYSICAL EXAMINATION:  GENERAL:  Well-developed, well-nourished, 77 year old white male, in no acute distress, alert and oriented x 3.  VITAL SIGNS:  Blood pressure 170/96, pulse 64, respirations 18.  HEENT:  Normocephalic, atraumatic.  Pupils are equal, round and reactive to light and accommodation.  Extraocular movements intact.  There are bright cataracts on funduscopic exam.  No glaucoma or macular degeneration.  NECK:  Supple, no JVD.  Bilateral bruits are heard, more pronounced on the left than on the right.  No lymphadenopathy.  CHEST:  Symmetrical lung inspirations.  LUNGS:  No wheezes, rhonchi, or rales.  No lymphadenopathy.  CARDIOVASCULAR:  Regular rate and rhythm, 2/6 systolic murmur best heard at the right sternal border, the crescendo in intensity towards the axilla, high pitched.  No rubs or gallops.  ABDOMEN:  Soft, obese, nontender, bowel sounds x 4,  no masses palpable, although there is the presence of a ventral hernia.  There is a pulsatile mass, cannot be palpated, no abdominal bruits heard.  GASTROURINARY:  Deferred.  RECTAL:  Deferred.  EXTREMITIES:  No clubbing, cyanosis, or edema.  SKIN:  No ulcerations and warm temperature.  NEUROLOGIC:  Peripheral pulses show carotids 2+ bilaterally, femorals, popliteal, dorsalis pedis, and posterior tibials are 2+ bilaterally. Nonfocal.  Gait steady.  DTRs 2+  bilaterally.  Muscle strength 5/5.  ASSESSMENT AND PLAN:  A 77 year old white male with a known history of abdominal aortic aneurysm who will undergo an arteriogram on November 01, 2000, in preparation of this aneurysm, to be determined if he will undergo stent repair versus open procedure.  This repair will be scheduled on November 03, 2000.  No blood transfusions are to be given to this patient, for he is a Jehovah Witness.  Dr. Arbie Cookey has seen and evaluated this patient prior to this admission, and has explained the risks and benefits of the procedure, and the patient has agreed to continue. DD:  10/29/00 TD:  10/29/00 Job: [id number]

## 2011-02-20 NOTE — Discharge Summary (Signed)
NAME:  [name] [name], [name] [name] NO.:  [id number]   MEDICAL RECORD NO.:  [id number]          PATIENT TYPE:  INP   LOCATION:  1515                         FACILITY:  [facility]   PHYSICIAN:  Ollen Gross, M.D.    DATE OF BIRTH:  01/13/1923   DATE OF ADMISSION:  12/08/2006  DATE OF DISCHARGE:  12/12/2006                               DISCHARGE SUMMARY   ADMISSION DIAGNOSES:  1. Osteoarthritis, left knee.  2. Valvular heart disease.  3. Hypertension.  4. Abdominal aortic aneurysm, status post repair.  5. Carotid arterial disease, status post left carotid endarterectomy      (also right carotid disease, monitored closely by Dr. Arbie Cookey).   DISCHARGE DIAGNOSES:  1. Osteoarthritis, left knee status post left total knee arthroplasty.  2. Mild postoperative blood loss anemia, not requiring transfusion.  3. Postoperative hyponatremia, improved.  4. Osteoarthritis, left knee.  5. Valvular heart disease.  6. Hypertension.  7. Abdominal aortic aneurysm, status post repair.  8. Carotid arterial disease, status post left carotid endarterectomy      (also right carotid disease, monitored closely by Dr. Arbie Cookey).   PROCEDURES:  December 08, 2006, left total knee arthroplasty.  Surgeon:  Ollen Gross, M.D.  Assistant:  Alexzandrew L. Perkins, P.A.-C.  Spinal  anesthesia.  Tourniquet time 41 minutes.   CONSULTATIONS:  None.   BRIEF HISTORY:  Mr. Corron is an 77 year old male with severe end-  stage arthritis of the left knee.  He has failed nonoperative management  and now presents for a left total knee arthroplasty.   LABORATORY DATA:  Preop CBC showed a hemoglobin of 13.9, hematocrit of  41.7, white cell count of 5.7.  postop hemoglobin 11.5.  Last noted H&H  9.6 and 28.2.  PT/PTT preop 13.8 and 33, respectively.  INR 1.0.  Serial  pro times followed.  Last noted PT and INR of 21.7 and 1.8.  Chemistry  panel on admission:  Elevated creatinine of 1.51, elevated glucose of  123.   Remaining chemistry panel within normal limits.  Serial BMETs were  followed.  Sodium did drop from 142 to 134, back up to 136.  Remaining  electrolytes remained within normal limits.  Creatinine improved down to  a normal level of 1.49.  preop UA negative.   EKG December 02, 2006:  Normal sinus rhythm, normal EKG, confirmed by  Dr. Nanetta Batty.  Compared, no was no change since last tracing of  September 3, 204.  Chest x-ray 2-view November 06, 2006:  Chronic lung  changes, no definite acute overlying pulmonary findings.   HOSPITAL COURSE:  The patient was admitted to [facility] and  tolerated the procedure well, later transferred to recovery room and the  orthopedic floor.  Did have an Autovac placed at time of surgery and was  given his own blood back.  Started on PCA and p.o. analgesics postop.  Was doing well on the morning of day #1, no complaints.  Blood pressure  was stable.  Hemoglobin looked good at 11.5.  discontinued the O2,  started getting up with PT.  Out of bed  by day #2, was doing very well,  no complaints, progressing well with physical therapy.  Was up  ambulating about 50 feet, doing well, excellent output.  Dressing was  changed.  Incision looked good.  He had a little drop in his sodium,  down to 134.  Discontinued his fluids, rechecked BMET.  T was felt he  was doing so well he would probably be ready to go home by the following  day.  The following day sodium did come back up to 136.  He was  tolerating his medications, been seen by weekend coverage.  He continued  to progress with therapy, needed a little bit more time.  Continued with  his PT and had met all of his goals and was ready to go home by the end  of the weekend December 12, 2006.   DISCHARGE PLAN:  Patient discharged home on December 12, 2006.   DISCHARGE DIAGNOSES:  Please see above.   DISCHARGE MEDICATIONS:  Percocet, Robaxin, Coumadin, Nu-Iron.   DIET:  Resume home diet, heart-healthy.    ACTIVITY:  Weightbearing as tolerated.  Total knee protocol.  Home  health PT, home nursing, daily dressing changes.   FOLLOW-UP:  2 weeks from surgery.   DISPOSITION:  Home.   CONDITION UPON DISCHARGE:  Improving.      Alexzandrew L. Julien Girt, P.A.      Ollen Gross, M.D.  Electronically Signed    ALP/MEDQ  D:  01/04/2007  T:  01/04/2007  Job:  [id number]   cc:   Hal T. Stoneking, M.D.  Fax: [phone]   Larina Earthly, M.D.  [address]  [place]  [place] [address]

## 2011-02-20 NOTE — Op Note (Signed)
NAME:  [name] [name], [name] [name]                         ACCOUNT NO.:  [id number]   MEDICAL RECORD NO.:  [id number]                   PATIENT TYPE:  OIB   LOCATION:  2869                                 FACILITY:  [facility]   PHYSICIAN:  Larina Earthly, M.D.                 DATE OF BIRTH:  [birth year]   DATE OF PROCEDURE:  06/13/2003  DATE OF DISCHARGE:                                 OPERATIVE REPORT   PREOPERATIVE DIAGNOSIS:  Severe asymptomatic left internal carotid artery  stenosis.   POSTOPERATIVE DIAGNOSIS:  Severe asymptomatic left internal carotid artery  stenosis.   OPERATION PERFORMED:  Arch and cerebral arteriogram.   SURGEON:  Larina Earthly, M.D.   ASSISTANT:  Nurse.   ANESTHESIA:  1% lidocaine local.   COMPLICATIONS:  None.   DISPOSITION:  To holding area stable.   DESCRIPTION OF PROCEDURE:  The patient was taken to the peripheral vascular  cath lab and placed in supine position where the area of the right groin was  prepped and draped in the usual sterile fashion.  Using local anesthesia and  a single wall puncture, the right limb of the aortofemoral graft was entered  and a guidewire was passed up to the level of the arch.  A pigtail catheter  was positioned at the level of the ascending arch and a 40 degree LAO  projection was undertaken.  This revealed mild irregularity at the take off  of the innominate and left carotid artery. There was significant plaque at  the take off of the left subclavian artery.  This was not flow limiting.  On  the arch injection it was obvious that there was 95+% stenosis of the left  internal carotid artery above the bifurcation.  Next, using a head hunter  catheter, the innominate artery was selected and intracranial and  extracranial right carotid injections were undertaken.  This revealed  moderate 50 to 60% stenosis of the internal carotid artery above the  bifurcation on the right.  Intracranial views were obtained and these will  be  dictated as a separate note from [facility].  Next,  the left carotid artery was selected with a CK catheter and a selected  carotid views were undertaken again showing severe stenosis of the left  internal carotid artery just distal to the bifurcation.  The patient  tolerated the procedure without immediate complications and was transferred  to the holding area in stable condition.   FINDINGS:  Severe 95% stenosis of the internal carotid artery above the  carotid bifurcation on the left, moderate 60% stenosis internal carotid  artery on the right.                                               Kristen Loader.  Early, M.D.    TFE/MEDQ  D:  06/13/2003  T:  06/13/2003  Job:  [id number]

## 2011-02-20 NOTE — Procedures (Signed)
[place]. [facility]  Patient:    [name] [name], [name] [name]                      MRN: [id number] Proc. Date: 11/01/00 Adm. Date:  [id number] Attending:  Alyson Locket CC:         Hal T. Stoneking, M.D.   Procedure Report  PREOPERATIVE DIAGNOSIS:  Abdominal aortic aneurysm.  POSTOPERATIVE DIAGNOSIS:  Abdominal aortic aneurysm.  PROCEDURE:  Aortograms, bilateral lower extremity runoff.  SURGEON:  Larina Earthly, M.D.  ANESTHESIA:  1% lidocaine local.  COMPLICATIONS:  None.  DISPOSITION:  To holding area stable.  DESCRIPTION OF PROCEDURE:  The patient was taken to the peripheral vascular catheterization lab, placed in the supine position, where the area of the right groin was prepped and draped in a sterile fashion.  Using local anesthesia and a single wall puncture, the right common femoral artery was entered.  A guidewire was passed up to the level of the distal external iliac artery, and it could not pass further than this.  The pigtail catheter was exchanged for a Wholey wire, and again this could not be traversed.  A 5 French sheath was passed over the guidewire and using an angled right catheter, the guidewire could be successfully placed up to the level of the suprarenal aorta.  Pigtail catheter was then passed over the guidewire as the guidewire was removed.  One injection was taken through the sheath that revealed high-grade stenosis in the external iliac artery with a great deal of irregularity in the external iliac artery.  The pigtail catheter was then positioned at the level of the suprarenal aorta, and AP projections were undertaken.  This revealed widely patent single renal arteries bilaterally. Lateral projection also revealed widely patent superior mesenteric and celiac arteries.  There was a great deal of mural thrombus in the aneurysm, so the true aneurysm size could not be calculated from this study.  The right renal was a higher  takeoff than the left renal.  There did not appear to be significant stenosis in either renal artery.  The catheter was then withdrawn down into the area above the bifurcation of the aorta, and runoff was obtained through this injection.  The patient did have severe iliac disease with complete occlusion of the external iliac artery by the diagnostic pigtail catheter.  There was collateral filling of the common filling artery, and the superficial femoral and profunda femoris arteries were patent.  On the left, there was marked irregularity throughout the iliac system as well.  The superficial femoral arteries were patent throughout their course with a moderate amount of irregularity at the adductor canal.  The popliteal artery was widely patent.  There was three-vessel runoff bilaterally with the posterior tibial and peroneal arteries being the dominant vessels bilaterally. The patient tolerated the procedure without immediate complications, was transferred to the holding area in stable condition.  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 DD:  11/01/00 TD:  11/01/00 Job: [id number]

## 2011-02-20 NOTE — H&P (Signed)
   NAME:  [name] [name], [name] [name]                         ACCOUNT NO.:  [id number]   MEDICAL RECORD NO.:  [id number]                   PATIENT TYPE:  INP   LOCATION:                                       FACILITY:  [facility]   PHYSICIAN:  Larina Earthly, M.D.                 DATE OF BIRTH:  17-Sep-1923   DATE OF ADMISSION:  06/18/2003  DATE OF DISCHARGE:                                HISTORY & PHYSICAL   PRIMARY CARE PHYSICIAN:  Hal T. Stoneking, M.D.   CHIEF COMPLAINT:  Severe asymptomatic left internal carotid artery stenosis.   HISTORY OF PRESENT ILLNESS:  This is a pleasant 77 year old male referred by  Dr. Brooke Dare to Dr. Arbie Cookey for evaluation of left carotid stenosis.  Dr. Brooke Dare  detected a problem with the left carotid on routine exam in 1998, according  to the patient.  He had an ultrasound and was referred to Dr. Arbie Cookey at that  time.  Dr. Arbie Cookey did not feel that surgery was necessary at that time and  has been following the patient since that time.  The patient had an arch and  cerebral arteriogram on June 13, 2003, which revealed a 95+% stenosis of  the left internal carotid artery above the bifurcation.  The patient does  experience occasional dizziness.  He denies CVA and TIA symptoms, increased  syncope, blackouts, and numbness and tingling.  The patient denies a history  of diabetes mellitus, anemia, DVT, pulmonary embolism, and GERD symptoms.  The patient denies shortness of breath, dyspnea on exertion, cough, weight  gain, and angina.  It is Dr. Bosie Helper opinion that the patient should have a  carotid endarterectomy on September 13, 75.0   PAST MEDICAL HISTORY:  1. Status post AAA in January of 2002.  2. Hypertension.  3. BPH.  4. The patient is a Scientist, product/process development.   PAST SURGICAL HISTORY:  1. AAA repair.  2. Left wrist surgery for repair of fracture in December of 2002.   MEDICATIONS:  1. Norvasc 10 mg one p.o. daily.  2. HCTZ 25 mg one p.o. daily.  3. Altace 25 mg one  p.o. daily.  4. Vioxx 25 mg one p.o. daily.  5. Aspirin 81 mg one p.o. daily.  6. Multivitamins.   ALLERGIES:  No known drug allergies.    FAMILY HISTORY:  Mother positive for hypertension.  Father positive for  myocardial infarction.  Sister positive for Parkinson's disease.  Brother  positive for diabetes mellitus.   Dictation ended at this point.      [name] [name], [place]                      Larina Earthly, M.D.    AY/MEDQ  D:  06/13/2003  T:  06/13/2003  Job:  [id number]

## 2011-02-20 NOTE — H&P (Signed)
NAME:  [name] [name], [name] [name] NO.:  [id number]   MEDICAL RECORD NO.:  [id number]          PATIENT TYPE:  INP   LOCATION:  NA                           FACILITY:  [facility]   PHYSICIAN:  Ollen Gross, M.D.    DATE OF BIRTH:  May 19, 1923   DATE OF ADMISSION:  12/08/2006  DATE OF DISCHARGE:                              HISTORY & PHYSICAL   DATE OF OFFICE VISIT/HISTORY AND PHYSICAL:  December 02, 2006.   DATE OF ADMISSION:  December 08, 2006.   CHIEF COMPLAINT:  Left knee pain.   HISTORY OF PRESENT ILLNESS:  Patient is an 77 year old male who has been  seen by Dr. Lequita Halt for ongoing left knee pain.  He states that both  knees are giving him quite a hard time, but the left knee seems to be  the worst.  It is preventing him from things that he wants to do.  He  has been seen by Dr. Despina Hick and found in the office to have severe end-  stage, tricompartmental arthritis.  The left is much more worse than the  right.  He does have a significant varus deformity and also some lateral  subluxation of the tibia on both sides.  The patellofemoral joints are  obliterated bilaterally.  He does have significant findings with his  knee and felt that he would benefit from undergoing surgical  intervention.  Risks and benefits have been discussed, and he has  elected to proceed with surgery.  He has been seen by Dr. Merlene Laughter  preoperatively and felt that he is medically cleared for surgery.   ALLERGIES:  No known drug allergies.   CURRENT MEDICATIONS:  1. Furosemide 80 mg.  2. Altace 2.5 mg.  3. Norvasc 10 mg.  4. Vytorin 10/80.  5. Cyclobenzaprine 10.   He has stopped his nutritional supplements.  He stopped his aspirin  preop.   PAST MEDICAL HISTORY:  1. Valvular heart disease with a slight leakage.  2. Hypertension.  3. Abdominal aortic aneurysm.  4. Peripheral vascular disease in the form of carotid arterial      disease.   PAST SURGICAL HISTORY:  1. Abdominal aortic  aneurysm repair in 2002.  2. Left carotid endarterectomy in 2004.  3. Broken wrist surgery in 2003.   SOCIAL HISTORY:  Married.  Retired.  Nonsmoker.  Either one beer or one  glass of wine daily.  Four children.  Wife will be assisting with care  after surgery.   FAMILY HISTORY:  Leaky valve with his father.  Mother with a history  of stroke, arthritis, and hypertension.  He has an aunt with diabetes.   REVIEW OF SYSTEMS:  GENERAL:  No fevers, chills, night sweats.  NEURO:  No seizures, syncope, paralysis.  RESPIRATORY:  No shortness of breath,  productive cough, or hemoptysis.  CARDIOVASCULAR:  No chest pain,  angina, or orthopnea.  GI:  Does have some constipation.  No nausea.  No  blood or mucus in the stool.  GU:  No dysuria, hematuria, or discharge.  MUSCULOSKELETAL:  Left knee.   PHYSICAL EXAMINATION:  VITAL SIGNS:  Pulse 84, respirations 16, blood  pressure 130/60.  GENERAL:  An 77 year old white male who is well-developed and well-  nourished in no acute distress.  He is alert, oriented and cooperative.  Very pleasant.  Good historian.  He is accompanied by his wife.  HEENT:  Normocephalic and atraumatic.  Pupils are round and reactive.  Oropharynx is clear.  EOMs are intact.  Does have full upper and lower  denture plates.  NECK:  Supple with a trace to +1 right carotid bruit with a trace left  carotid bruit.  He does have a previous carotid scar on the left.  CHEST:  Clear anterior and posterior chest wall.  No rales, rhonchi or  wheezes.  HEART:  Regular rhythm with a grade 2/6 systolic ejection murmur best  heard over the aortic and along the left sternal border.  ABDOMEN:  Soft, round.  Slightly protuberant.  Bowel sounds present.  RECTAL/BREASTS/GENITALIA:  Not done.  Not pertinent to the present  illness.  EXTREMITIES:  Left knee:  He does have a varus malalignment deformity.  Range of motion shows crepitus with motion of about 5 degrees lacking  extension and 100  degrees of flexion.  No effusion.   IMPRESSION:  1. Osteoarthritis, left knee.  2. Valvular heart disease.  3. Hypertension.  4. Abdominal aortic aneurysm, status post repair.  5. Carotid arterial disease, status post left carotid endarterectomy.      Also, right carotid arterial disease (monitored closely by Dr.      Arbie Cookey).   PLAN:  Patient admitted to [facility] to undergone a left  total knee replacement arthroplasty.  Surgery will be performed by Dr.  Trudee Grip.  His medical physician, Dr. Pete Glatter, will be notified of  the room number on admission and will be consulted if needed for medical  assistance with this patient.  It is of note that the patient is a  TEFL teacher Witness and denies the use of any blood products; however, we  have discussed at length preoperatively  with Cell Saver or Autovac transfuser to be used during the procedure.  The Autovac transfuser is discussed with him, and he and his wife agree.  This will be allowed; however, no other blood products, which is not the  patient's, will be used.      Alexzandrew L. Julien Girt, P.A.      Ollen Gross, M.D.  Electronically Signed    ALP/MEDQ  D:  12/07/2006  T:  12/08/2006  Job:  [id number]   cc:   Hal T. Stoneking, M.D.  Fax: [phone]   Larina Earthly, M.D.  [address]  [place]  [place] [address]

## 2011-02-20 NOTE — Op Note (Signed)
NAME:  [name] [name], [name] [name] NO.:  [id number]   MEDICAL RECORD NO.:  [id number]          PATIENT TYPE:  INP   LOCATION:  0009                         FACILITY:  [facility]   PHYSICIAN:  Ollen Gross, M.D.    DATE OF BIRTH:  January 04, 1923   DATE OF PROCEDURE:  12/08/2006  DATE OF DISCHARGE:                               OPERATIVE REPORT   PREOPERATIVE DIAGNOSIS:  Osteoarthritis, left knee.   POSTOPERATIVE DIAGNOSIS:  Osteoarthritis, left knee.   PROCEDURE:  Left total knee arthroplasty.   SURGEON:  Ollen Gross, M.D.   ASSISTANT:  Avel Peace, PA-C.   ANESTHESIA:  Spinal.   ESTIMATED BLOOD LOSS:  Minimal.   DRAINS:  Hemovac x1.   TOURNIQUET TIME:  Forty-one minutes at 300 mmHg.   COMPLICATIONS:  None.   CONDITION:  Stable to the recovery room.   BRIEF CLINICAL NOTE:  [name] [name] is an 77 year old male with severe  end-stage arthritis to the left knee with intractable pain.  He has  failed nonoperative management and presents now for left total knee  arthroplasty.   PROCEDURE IN DETAIL:  After successful administration of general  anesthetic, a tourniquet is placed high on the left thigh, and the left  lower extremity prepped and draped in the usual sterile fashion.  The  extremity is wrapped in esmarch, the knee flexed, and the tourniquet  inflated to 300 mmHg.  A midline incision was made with a 10 blade  through the subcutaneous tissue to the level of the extensor mechanism.  A fresh blade is used to make a medial parapatellar arthrotomy.  The  soft tissue over the proximal medial tibia is subperiosteally elevated  to the joint line with a knife and into the semimembranosus bursa with a  Cobb elevator.  The soft tissue laterally is elevated laterally with  attention being paid to avoid the patellar tendon on the tibial  tubercle.  The patella is subluxed laterally, the knee flexed to 90  degrees.  ACL and PCL removed.  The drill is used to create a  starting  hole in the distal femur, and the canal is thoroughly irrigated.  A 5  degree left valgus alignment guide is placed, and referencing off the  posterior condyle, rotation is marked in the block pin to remove 10 mm  off the distal femur.  Distal femoral resection is made with an  oscillating saw.  A sizing block is placed, and a size 4 is most  appropriate.  Rotation is marked off the epicondylar axis.  A size 4  cutting block is placed, and the anterior and posterior and chamfer cuts  are made.   The tibia is subluxed forward, and the menisci are removed.  Extramedullary tibial alignment guide is placed, referencing proximally  at the medial aspect of the tibial tubercle and distally along the  second metatarsal axis and tibial crest.  The block is pinned to remove  about 10 mm off the nondeficient lateral side.  Tibial resection is made  with an oscillating saw.  The size 4 is also the most appropriate tibial  component, and the proximal tibia is prepared with a modular drill and  keel punch for a size 4.  Femoral preparation is completed with the  intercondylar cut.   A size 4 posterior stabilized femoral trial with a size 4 mobile-bearing  tibial trial and a 12.5 mm posterior stabilized rotating platform insert  trial was placed.  We went with the 12.5 because of the tibial  resection.  With the 12.5, full extension is achieved with excellent  varus and valgus balance throughout with full range of motion.  The  patella is then everted, and thickness measured to be 22 mm.  Free-hand  resection is taken to 12 mm.  A 41 template is placed.  Lug holes are  drilled.  The trial patella is placed, and it tracks normally.  Osteophytes are removed off the posterior femur with a trial in place.  All trials were removed, and the cut-bone surfaces are prepared with  pulsatile lavage.  Cement is mixed, and once ready for implantation, the  size 4 mobile-bearing tibial tray, size 4  posterior stabilized femur,  and 41 patella are cemented into place.  The patella is held with a  clamp.  The trial 12.5 mm insert is placed.  The knee held in full  extension, and all extruded cement is removed.  Once the cement is fully  hardened, then the permanent 12.5 mm posterior stabilized rotating  platform insert is placed into the tibial tray.  The wound is copiously  irrigated with saline solution.  The extensor mechanism closed over a  Hemovac drain with interrupted #1 PDS.  Flexion against gravity is 135  degrees.  The tourniquet is released for a total time of 41 minutes.  The subcu is closed with interrupted 2-0 Vicryl and the subcuticular  with running 4-0 Monocryl.  Drain is hooked to suction.  The incision is  cleaned and dried.  Steri-Strips and a bulky sterile dressing applied.  He is then placed into a knee immobilizer, awakened and transported to  recovery in stable condition.      Ollen Gross, M.D.  Electronically Signed     FA/MEDQ  D:  12/08/2006  T:  12/08/2006  Job:  [id number]

## 2011-02-20 NOTE — Consult Note (Signed)
   NAME:  [name] [name], [name] [name]                         ACCOUNT NO.:  [id number]   MEDICAL RECORD NO.:  [id number]                   PATIENT TYPE:  OIB   LOCATION:  2869                                 FACILITY:  [facility]   PHYSICIAN:  Janeece Riggers. Karin Golden, M.D.                DATE OF BIRTH:  02-08-1923   DATE OF CONSULTATION:  DATE OF DISCHARGE:  06/13/2003                                   CONSULTATION   REASON FOR CONSULTATION:  Interpretation of intracranial views from a  cerebral arteriogram done June 13, 2003.   HISTORY:  Asymptomatic carotid stenosis.  Evaluate for progression.   INTERPRETATION:  Right internal carotid arteriogram:  This vessel is  opacified via a common carotid artery injection.  The internal carotid  artery is widely patent into the brain with no syphon stenosis.  This vessel  gives supply to the right middle cerebral artery, the right posterior  cerebral artery via a fetal origin, and both anterior cerebral artery  distributions via an azygous anterior cerebral artery.  There is also some  cross supply to the left middle cerebral artery territory, indicating flow  reduction in the left internal carotid artery.  None of these vessels show  proximal stenosis or evidence of an aneurysm or vascular malformation.  The  venous and parenchymal phases are normal.   IMPRESSION:  Intrinsically normal intracranial vessels supplied via the  right internal carotid artery.  The intracranial circulation on the left was  not studied.  Presumably, there is compromised flow within the left internal  carotid artery based on the intracranial supply from the right internal  carotid artery.  See above for full discussion.                                               Mark E. Karin Golden, M.D.    MES/MEDQ  D:  06/14/2003  T:  06/15/2003  Job:  [id number]

## 2011-02-20 NOTE — Op Note (Signed)
[place]  Patient:    [name] [name], [name] [name]                      MRN: [id number] Proc. Date: 11/03/00 Adm. Date:  [id number] Attending:  Alyson Locket CC:         Hal T. Stoneking, M.D.  [phone]   Operative Report  PREOPERATIVE DIAGNOSIS: 1. Infrarenal abdominal aortic aneurysm. 2. Right iliac occlusive disease.  POSTOPERATIVE DIAGNOSIS: 1. Infrarenal abdominal aortic aneurysm. 2. Right iliac occlusive disease.  OPERATION PERFORMED:  Resection and grafting of abdominal aortic aneurysm, replacement with a 14 x 8 Hemoshield aorta to right common femoral and left external iliac artery bypass.  SURGEON:  Larina Earthly, M.D.  ASSISTANT:  Areta Haber, P.A.  ANESTHESIA:  General endotracheal.  COMPLICATIONS:  None.  DISPOSITION:  To the recovery room stable.  DESCRIPTION OF PROCEDURE:  The patient was taken to the operating room and placed in supine position where the abdomen and both groins were prepped and draped in the usual sterile fashion.  Incision was made from the level of the xiphoid to the pubis and carried down to the midline fascia with electrocautery.  The peritoneum was entered.  The stomach, large and small bowel, gallbladder and liver were all explored and were normal.  The Omnitrack retractor was used for exposure.  The transverse colon and omentum were reflected superiorly and the small bowel was reflected to the right.  The duodenum was mobilized off the aneurysm to the right.  The aorta was encircled below the level of the renal arteries at the level of the left renal vein. Dissection was carried down to the level of the aortic bifurcation.  There was extensive atheromatous disease in the aortic bifurcation but no aneurysmal change.  The right and left common iliac arteries were looped with #5 Polydek suture.  There was avulsion of the anterior branch at the juncture of the right common iliac vein under the right  common iliac artery.  This was exposed and attempts at single suture at this level were unsuccessful.  This was therefore easily controlled with pressure while further dissection was undertaken.  The separate incision made over the right femoral artery and carried down to isolate the common superficial femoral and profunda femoris arteries.  These were encircled with vessel loops.  A tunnel was then created from the level of the femoral artery up to the level of the iliac arteries taking care to pass behind the level of the right ureter.  Next, the separate incision was made over the external iliac artery just prior to its exit from the groin and the artery was of good caliber with mild atheromatous changes. Decision was made to place the anastomosis on the left to the external iliac artery in this position.  This was encircled with a vessel loop and a tunnel was created again behind the level of the ureter up to the level of the aortic bifurcation.  The patient was given 25 gm of mannitol and 8000 units of intravenous heparin.  After adequate circulation time, the aorta was occluded below the level of the renal arteries.  The common iliac arteries were doubly ligated bilaterally with Polydek ties.  The aorta was opened with electrocautery and the mural thrombus was removed.  The inferior mesenteric artery was chronically occluded, was ligated and divided.  Backbleeding was controlled with 2-0 silk pop-off figure-of-eight sutures.  The aorta was transected below the  level of the proximal clamp.  Next the right common iliac artery was divided for better exposure of the iliac vein injury. This bleeding was controlled over the iliac vein with finger pressure. The right and left common iliac veins, the inferior vena cava were all exposed for control to close the hole in the proximal right common iliac vein with a running 4-0 Prolene suture.  This controlled the bleeding from this area.  The  patient remained hemodynamically stable throughout this maneuver.  The 14 x 8 Hemoshield graft was brought onto the field and using a felt strip, was sewn end-to-end to the aorta with a running 3-0 Prolene suture.  The anastomosis was tested and one additional suture was required for hemostasis.  The limbs of the graft were then brought down to the right groin and left external iliac artery again taking care to pass in the prior created tunnel behind the level of the ureters.  The external iliac artery was occluded proximally and distally, was opened with an 11 blade and extended longitudinally with Potts scissors.  The left limb of the graft was cut to the appropriate length, was spatulated and sewn end-to-side to the artery with a running 5-0 Prolene suture.  Prior to completion of the anastomoses, flushing maneuvers were undertaken.  Anastomosis was then completed and clamps were removed with the good flow noted at the level of the groin.  Next the right common, superficial femoral and profunda femoris arteries were occluded.  The common femoral artery was opened with an 11 blade and extended longitudinally with Potts scissors.  This graft was cut to the appropriate length and dimension and was sewn end-to-side to the common femoral artery with a running 5-0 Prolene suture.  Prior to completion of this anastomosis, the usual flushing maneuvers were undertaken.  The anastomosis was completed, clamps were removed and flow restored to the leg.  The patient was given 100 mg of protamine to reverse the heparin.  The wounds were irrigated with saline.  Hemostasis electrocautery. The wall of the aneurysm was closed over the graft with a running 2-0 Vicryl suture.  The retroperitoneum was then closed with a running 2-0 Vicryl suture. The small bowel was run in its entirety, found to be without injury and was placed back in the pelvis.  The transverse colon and omentum were placed over  this.   The midline fascia was closed with a #1 PDS suture beginning proximally and distally and tying in the middle.  The midline skin was closed with skin clips.  The right groin was closed with several layers of 2-0 Vicryl suture in the subcutaneous tissues.  The skin was closed with skin clips.  Sterile dressing was applied and the patient was taken to the recovery room in stable condition. DD:  11/03/00 TD:  11/03/00 Job: [id number]

## 2011-02-20 NOTE — H&P (Signed)
NAME:  [name] [name], [name] [name]                         ACCOUNT NO.:  [id number]   MEDICAL RECORD NO.:  [id number]                   PATIENT TYPE:  INP   LOCATION:  NA                                   FACILITY:  [facility]   PHYSICIAN:  Larina Earthly, M.D.                 DATE OF BIRTH:  [birth year]   DATE OF ADMISSION:  06/13/2003  DATE OF DISCHARGE:                                HISTORY & PHYSICAL   PRIMARY CARE PHYSICIAN:  Hal T. Stoneking, M.D.   CHIEF COMPLAINT:  Severe asymptomatic left internal carotid artery stenosis.   HISTORY OF PRESENT ILLNESS:  This is a pleasant 77 year old male referred by  Dr. Pete Glatter to Dr. Arbie Cookey for evaluation of left carotid stenosis.  Dr.  Pete Glatter detected probable left carotid stenosis around 1998, according to  the patient.  He had an ultrasound and was referred to Dr. Arbie Cookey at that  time.  Dr. Arbie Cookey did not feel that surgery was necessary at the time and  asked to follow the patient.  The patient underwent arteriogram on June 13, 2003, which revealed 95+% stenosis of his left internal carotid artery.  The patient does experience occasional dizziness.   He denies CVA and TIA symptoms, hemiparesis, syncope, and blackouts.  The  patient denies history of diabetes mellitus, CHF, DVT, and GERD symptoms.  The patient denies shortness of breath, dyspnea on exertion, cough, weight  change, and __________.  It was Dr. Bosie Helper opinion that the patient should  undergo a carotid endarterectomy on June 18, 2003.   PAST MEDICAL HISTORY:  Status post __________ in 2002.  Hypertension.  BPH.  The patient is a Scientist, product/process development.   PAST SURGICAL HISTORY:  __________ to his left wrist, surgical care of  fracture in December  2002.   MEDICATIONS:  1. Norvasc 10 mg one p.o. daily.  2. HCTZ 25 mg one p.o. daily.  3. Altace 25 mg one p.o. daily.  4. Vioxx 25 mg p.o. daily.  5. Aspirin 81 mg one p.o. daily.  6. Vitamins.   ALLERGIES:  No known drug  allergies.   FAMILY HISTORY:  Mother with positive hypertension. Father positive for  myocardial infarction. Sister with Parkinson's disease.  Brother with  diabetes mellitus, hypertension, and CVA.   SOCIAL HISTORY:  The patient is a married white male with four children. He  is retired.  The patient quit smoking in 1969.  He does drink alcohol, a six  pack of beer per week.   REVIEW OF SYSTEMS:  See HPI for significant positives and negatives.   PHYSICAL EXAMINATION:  VITAL SIGNS:  Blood pressure 150/80, heart rate 66.  GENERAL:  This is a 77 year old white male in no acute distress.  He is  alert.  HEENT:  Normocephalic and atraumatic.  Pupils equal, round, and reactive to  light and accommodation.  Extraocular movements intact.  ENT clear.  NECK:  Supple with no lymphadenopathy.  No jugular venous distention.  There  is a bruit auscultated in the left carotid.  CHEST:  Symmetrical on inspiration.  HEART:  Regular rate and rhythm with 2/6 systolic murmur. No rubs and no  gallops.  LUNGS:  Clear to auscultation bilaterally.  ABDOMEN:  Positive bowel sounds x4.  Soft, nontender, and nondistended.  There are no masses.  PULSES:  Carotid 2+ bilaterally.  Femoral 2+ on the left and not palpable on  the right due to the patient status post angiogram.  Popliteal 2+  bilaterally.  Dorsalis pedis 2+ bilaterally.  EXTREMITIES:  No cyanosis, clubbing, or edema.  NEUROLOGICAL:  The patient is intact.   ASSESSMENT:  Carotid endarterectomy.      Silas Flood, P.A.-C              Larina Earthly, M.D.    AGY/MEDQ  D:  06/13/2003  T:  06/13/2003  Job:  [id number]

## 2011-02-20 NOTE — Discharge Summary (Signed)
[facility]  Patient:    [name] [name], [name] [name]                      MRN: [id number] Adm. Date:  [id number] Disc. Date: [id number] Attending:  Alyson Locket Dictator:   Marlowe Kays, P.A.                           Discharge Summary  DISCHARGE DIAGNOSES: 1. Status post ______  of the abdominal aortic aneurysm, right ______ . 2. Jehovahs Witness. 3. Postoperative anemia, stable.  SECONDARY DIAGNOSES: 1. Hypertension. 2. Benign prostatic hypertrophy. 3. Obesity. 4. Carotid artery disease.  PROCEDURES:  Status post resection and graft with 14 x 8 mm Hemashield aorta to right common femoral and left external iliac artery bypass graft, Dr. Arbie Cookey, November 03, 2000.  DISCHARGE MEDICATIONS:  Same home medications: 1. Ferrous sulfate 325 mg p.o. b.i.d. 2. Colace 100 mg p.o. b.i.d. 3. Darvocet-N 100 1 or 2 p.o. q.4-6h. p.r.n. for pain. 4. Pepcid 20 mg 1 p.o. b.i.d.  CONSULTATIONS:  None.  COMPLICATIONS:  None.  ALLERGIES:  NKDA.  FOLLOW-UP:  With Dr. Arbie Cookey in two weeks after discharge.  The patient will return one week after discharge for staple removal.  CONDITION ON DISCHARGE:  Improved.  HOSPITAL COURSE:  [name] [name] is a pleasant 77 year old white male with a history of AAA, followed up on ultrasound.  The follow-up CT in January 2000 revealed an increase of this aneurysm, now 5.2 cm, with no evidence of leak.  Dr. Arbie Cookey recommended elective dissection and graft of this aneurysm, for prevention of rupture.  On November 03, 2000, the patient was admitted to [facility] for ______ of the AAA with a 14 x 8 Hemashield aorta right femoral-left external iliac bypass by Dr. Arbie Cookey.  The patient tolerated the procedure well and was transferred to the PACU in stable condition.  The patient was stable through the hospital stay, although he experienced some anemia, with a hemoglobin of 8 and hematocrit of 26.  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  However, he was placed on ferrous sulfate, which seemed to have improved his condition.  It is anticipated that he will be discharged on Monday, November 08, 2000, in stable condition, if his physical exam is unremarkable and his anemia improves. DD:  11/07/00 TD:  11/07/00 Job: [id number]

## 2011-02-20 NOTE — Op Note (Signed)
NAME:  [name] [name], [name] [name]                         ACCOUNT NO.:  [id number]   MEDICAL RECORD NO.:  [id number]                   PATIENT TYPE:  INP   LOCATION:  2892                                 FACILITY:  [facility]   PHYSICIAN:  Larina Earthly, M.D.                 DATE OF BIRTH:  1923/06/25   DATE OF PROCEDURE:  06/18/2003  DATE OF DISCHARGE:                                 OPERATIVE REPORT   PREOPERATIVE DIAGNOSIS:  Severe left internal carotid artery stenosis.   POSTOPERATIVE DIAGNOSIS:  Severe left internal carotid artery stenosis.   OPERATION PERFORMED:  Left carotid endarterectomy and primary closure.   SURGEON:  Larina Earthly, M.D.   ASSISTANT:  [name], PA   ANESTHESIA:  General endotracheal.   COMPLICATIONS:  None.   DISPOSITION:  To recovery room neurologically intact.   DESCRIPTION OF PROCEDURE:  The patient was taken to the operating room and  placed in supine position where the area of the left neck was prepped and  draped in the usual sterile fashion.  Incision was made anterior to the  sternocleidomastoid and carried down to the platysma with electrocautery.  The sternocleidomastoid muscle reflected posteriorly and the carotid sheath  was opened.  The facial vein was ligated with 2-0 silk ties and divided.  The common carotid artery was encircled with an umbilical tape and a Rumel  tourniquet.  The vagus nerve was identified and preserved.  Dissection  extended onto the bifurcation.  The patient had no superior thyroid artery  at the bifurcation.  The branches were higher up on the external carotid and  the external carotid encircled with a blue vessel loop.  The internal  carotid artery was encircled with an umbilical tape and Rumel tourniquet.  Preoperative arteriogram had revealed severe stenosis in the internal  carotid artery above the bifurcation.  The patient had a large caliber  internal jugular vein.  The patient was given 8000 units of  intravenous  heparin.  After adequate circulation time, the internal, external and common  carotid arteries were occluded.  The common carotid artery was opened with  an 11 blade and extended longitudinally with Potts scissors through the  plaque onto the internal carotid artery with Potts scissors.  A 10 shunt was  passed up the internal carotid, allowed to back bleed then down the common  carotid where it was secured with Rumel tourniquets.  Then arteriotomy  begun on the common carotid artery and the plaque was divided proximally  with Potts scissors.  Endarterectomy was extended onto the bifurcation.  External carotid artery was endarterectomized with eversion technique and  the internal carotid artery was then endarterectomized in open fashion.  Remaining atheromatous debris was removed from the endarterectomy plane.  Due to the large caliber of the internal carotid artery, decision was made  to close the arteriotomy primarily.  This was done with a running  6-0  Prolene suture beginning proximally and distally.  Prior to completion of  the closure, the shunt was removed and the usual flushing maneuvers  undertaken.  The anastomosis was then completed.  The external followed by  the common followed by the internal carotid artery occlusion clamp was  removed.  Excellent flow characteristics were noted with hand held Doppler  in the internal and external carotid arteries.  The patient was  given 15 mg of protamine to reverse heparin.  The wound was irrigated with  saline.  Hemostasis was obtained with electrocautery.  The wounds were  closed with 3-0 Vicryl in the subcutaneous and subcuticular tissue.  Benzoin  and Steri-Strips were applied.                                                Larina Earthly, M.D.    TFE/MEDQ  D:  06/18/2003  T:  06/18/2003  Job:  [id number]

## 2011-02-20 NOTE — Discharge Summary (Signed)
NAME:  [name] [name], [name] [name]                         ACCOUNT NO.:  [id number]   MEDICAL RECORD NO.:  [id number]                   PATIENT TYPE:  INP   LOCATION:  3312                                 FACILITY:  [facility]   PHYSICIAN:  Larina Earthly, M.D.                 DATE OF BIRTH:  [birth year]   DATE OF ADMISSION:  06/18/2003  DATE OF DISCHARGE:  06/19/2003                                 DISCHARGE SUMMARY   ADMISSION DIAGNOSIS:  Left carotid stenosis.   PAST MEDICAL HISTORY:  1. Status post abdominal aortic aneurysm in January of 2002.  2. Hypertension.  3. Benign prostatic hypertrophy.  4. The patient is a TEFL teacher witness.   ALLERGIES:  The patient has no known drug allergies.   DISCHARGE DIAGNOSIS:  Left carotid stenosis, status post carotid  endarterectomy.   BRIEF HISTORY:  This is a 77 year old male referred by Dr. Brooke Dare to Dr. Kristen Loader. Early for evaluation of left carotid stenosis.  A problem was detected  with the left carotid on routine exam in 1998 by Dr. Brooke Dare.  The patient had  an ultrasound and was referred to Dr. Kristen Loader. Early at that time.  Dr. Kristen Loader. Early did not feel that surgery was necessary and has been following the  patient since then.   The patient had an arch and cerebral arteriogram on June 13, 2003 which  revealed a 95% stenosis of the left internal carotid artery above the  bifurcation.  The patient did complain of occasional dizziness.  The patient  denied CVA and TIA symptoms.   It was Dr. Antonietta Barcelona opinion that the patient should undergo a left  carotid endarterectomy on June 18, 2003.   HOSPITAL COURSE:  The patient was taken to the OR on June 18, 2003 for  a left carotid endarterectomy.  The patient tolerated the procedure well.  The patient was hemodynamically stable immediately postoperatively and was  extubated without problem.   The patient woke up from anesthesia neurologically intact.  The patient's  postoperative  course was uneventful and on the date of discharge he was  without complaint.  The patient was neurologically intact, afebrile, and  vital signs were stable.  The neck incision was stable.   LABORATORY DATA:  CBC on June 19, 2003 revealed a white count 6.3,  hemoglobin 10.7, hematocrit 31.7, platelets 153,000.  Chem-7 on the  June 19, 2003, sodium 139, potassium 4.3, BUN 23, creatinine 1.4,  glucose 99.   CONDITION ON DISCHARGE:  Improved.   DISCHARGE INSTRUCTIONS:  1. The patient is to resume home medications.  2. Pain management with Tylox 1-2 p.o. q.4-6h. p.r.n. pain.  3. No driving until follow up with the surgeon and no heavy lifting,     pulling, or pushing.  4. Resume home diet.  5. Shower daily.  Clean incision with soap and water and no  cream solution     on the incision.  6. Call the office if questions.  7. Follow up with Dr. Larina Earthly on Thursday, July 12, 2003 at 10:40     a.m.      [name], [place]                      Larina Earthly, M.D.    AY/MEDQ  D:  06/19/2003  T:  06/19/2003  Job:  [id number]   cc:   Larina Earthly, M.D.  [address]  [place]  [place] [address]   Hal T. Stoneking, M.D.  [address] [place], [place] [address]  Fax: [phone]

## 2011-02-20 NOTE — Op Note (Signed)
[facility]  Patient:    [name] [name], [name] [name] Visit Number: [id number] MRN: [id number]          Service Type: DSU Location: [facility] 2899 10 Attending Physician:  Susa Day Dictated by:   Katy Fitch Naaman Plummer., M.D. Proc. Date: 09/27/01 Admit Date:  09/27/2001   CC:         Aura Dials, M.D.   Operative Report  PREOPERATIVE DIAGNOSIS:  Severely comminuted impacted fracture of left distal radius with disruption of distal radioulnar joint.  POSTOPERATIVE DIAGNOSIS:  Severely comminuted impacted fracture of left distal radius with disruption of distal radioulnar joint.  OPERATION PERFORMED:  Reconstruction of left distal radius with a DVR volar plate and a radial ASIF minifragment buttress plate.  SURGEON:  Katy Fitch. Sypher, Montez Hageman., M.D.  ASSISTANT:  Jonni Sanger, P.A.  ANESTHESIA:  General endotracheal.  ANESTHESIOLOGIST:  Dr. Noreene Larsson.  INDICATIONS FOR PROCEDURE:  The patient is a 77 year old man who fell on stairs at home on September 24, 2001 sustaining a severe injury to his left wrist.  He was seen by  Aura Dials, M.D. at the [facility] and had a repair of a laceration to the volar aspect of his left lower lip.  He subsequently was referred with proper splinting to the orthopedic and hand specialist for evaluation and management of his wrist fracture.  On clinical examination in the office on September 26, 2001 he was noted to have 3+ swelling of his fingers.  He had some minor skin abrasions.  He was noted to have chronic nail lysis possibly due to psoriasis or a previous trauma and had soiling beneath his nail plates.  We recommended proceed with open reduction internal fixation of his fracture anticipating use of a DVR plate.  He understands potential risks and benefits of surgery with specific risks being infection, failure of union, malunion and failure of hardware.  There is a small risk of  reflex sympathetic dystrophy.  DESCRIPTION OF PROCEDURE:  [name] [name] was brought to the operating room and placed in supine position on the operating table.  Following induction of general orotracheal anesthesia, the left arm was prepped with Betadine soap and solution and sterilely draped.  1 gm of Ancef was administered as an IV prophylactic antibiotic.  Following exsanguination of the limb with an Esmarch bandage, the arterial tourniquet was inflated to 240 mHg.  The procedure commenced with 10 cm incision with a Brunner zigzag across the distal wrist flexion crease extending along the flexor carpi radialis.  The subcutaneous tissues were carefully divided revealing the subcutaneous veins which were electrocauterized and retracted.  The fascia overlying the flexor carpi radialis was incised longitudinally and the fascia at the floor of the flexor carpi radialis was incised.  The flexor pollicis longus was retracted in an ulnar direction taking care to protect the median nerve and the radial artery was identified and retracted.  The pronator teres was elevated with a periosteal elevator exposing the fracture site and the distal 8 cm of the radius.  The fracture was reduced with three point molding into a virtually anatomic reduction.  The severely comminuted dorsal fragments were manually remodeled into anatomic position.  AP and lateral images documented virtually anatomic reduction except for a comminuted radial styloid fracture fragment was rather unstable.  The brachioradialis was partially released to facilitate reduction of the styloid fragment.  A DVR plate was placed with a total of four threaded  pegs anatomically reducing the articular fracture.  The styloid fragment was still a bit unstable and had a tendency to rotate, therefore, a 5-hole ASIF plate was placed on the radial aspect of the radius and contoured and managed to provide a retaining wall type buttress to  this radiostyloid preventing its rotation.  Anatomic reduction of the fracture was achieved with good position of the internal fixation hardware.  AP lateral and oblique C-arm images were obtained documenting very satisfactory reconstruction of the radius.  The wound was then copiously irrigated with ____________ solution followed by repair of the pronator quadratus over the plate with mattress sutures of 0 Vicryl. Followed by repair of the subcutaneous tissues with 3-0 Vicryl suture and repair of the skin with intradermal 3-0 Prolene.  A voluminous gauze and forearm dressing was applied with a sugartong splint and Ace wrap.  There were no apparent complications.  [name] [name] will be advised to begin immediate active range of motion exercises.  For aftercare he was given prescriptions for Percocet 7.5 mg 1 to 2 tablets p.o. q.4-6h. p.r.n. pain.  Total of 36 tablets without refill, also Keflex 500 mg 1 p.o. q.8h. x 5 days as prophylactic antibiotic. Dictated by:   Katy Fitch Naaman Plummer., M.D. Attending Physician:  Susa Day DD:  09/27/01 TD:  09/27/01 Job: [id number]

## 2011-04-12 IMAGING — CR DG SHOULDER 2+V*R*
3 series · 3 of 3 positions shown · non-contrast
Comparison: None

CLINICAL DATA: Fall with right shoulder pain

RIGHT SHOULDER - 2+ VIEW

[w shoulder ap external righ]
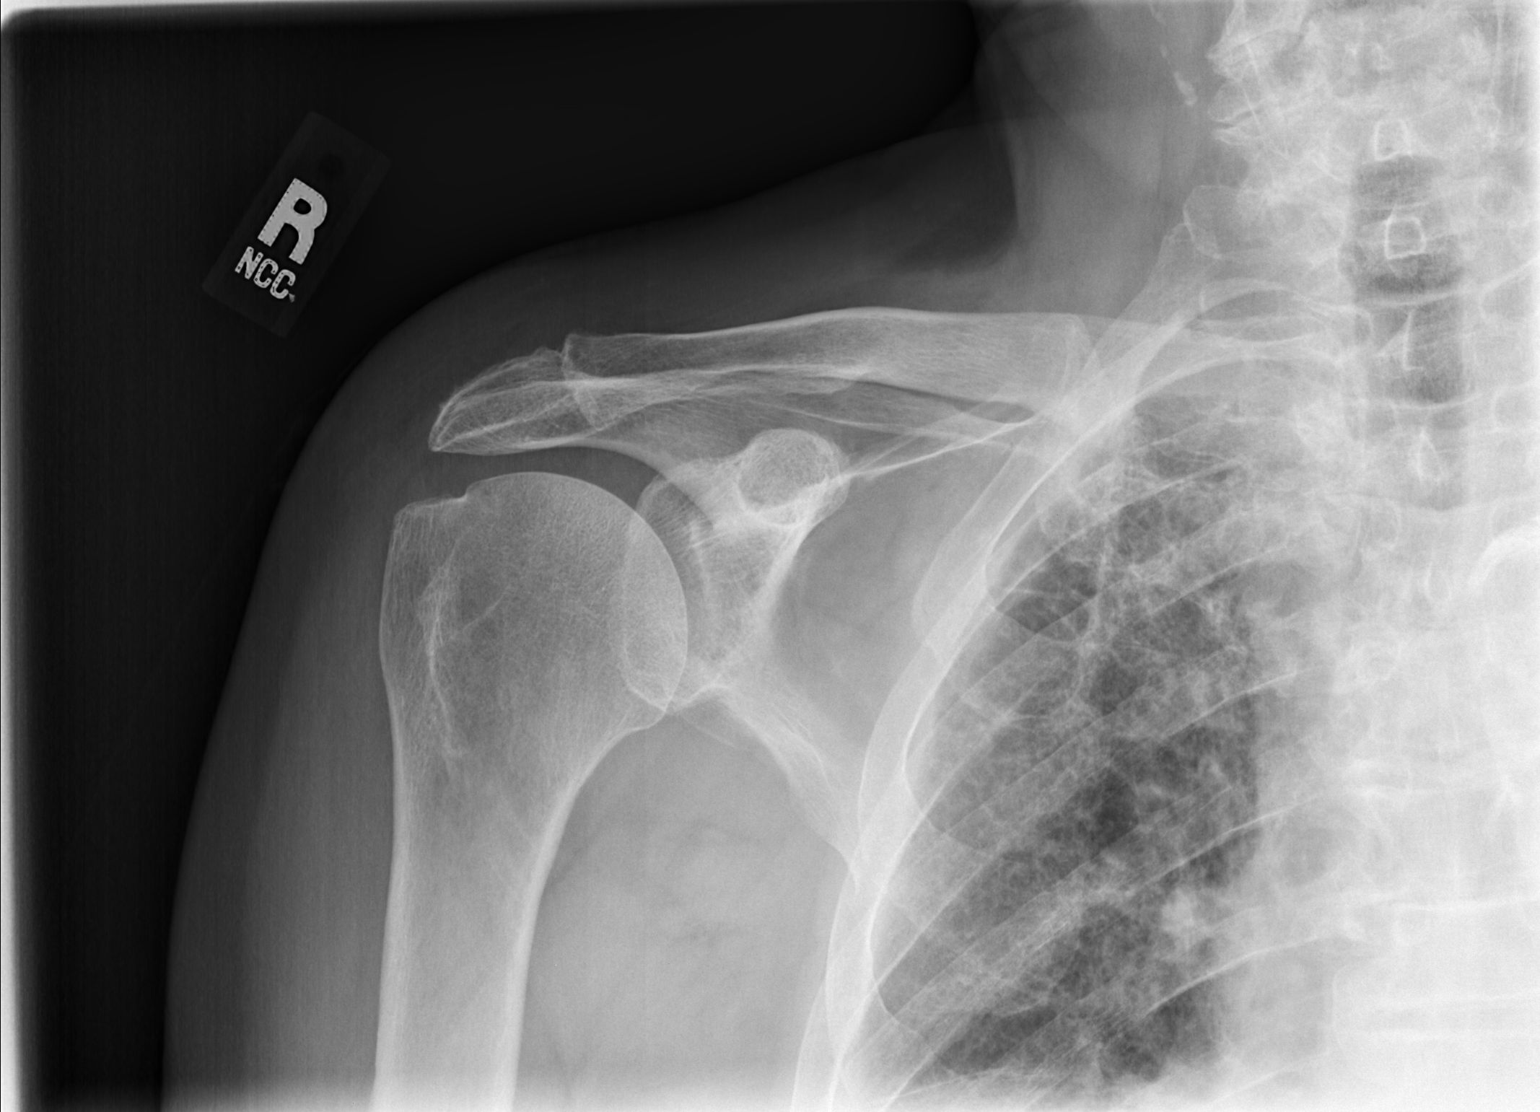

[w shoulder y view right (1 of 2)]
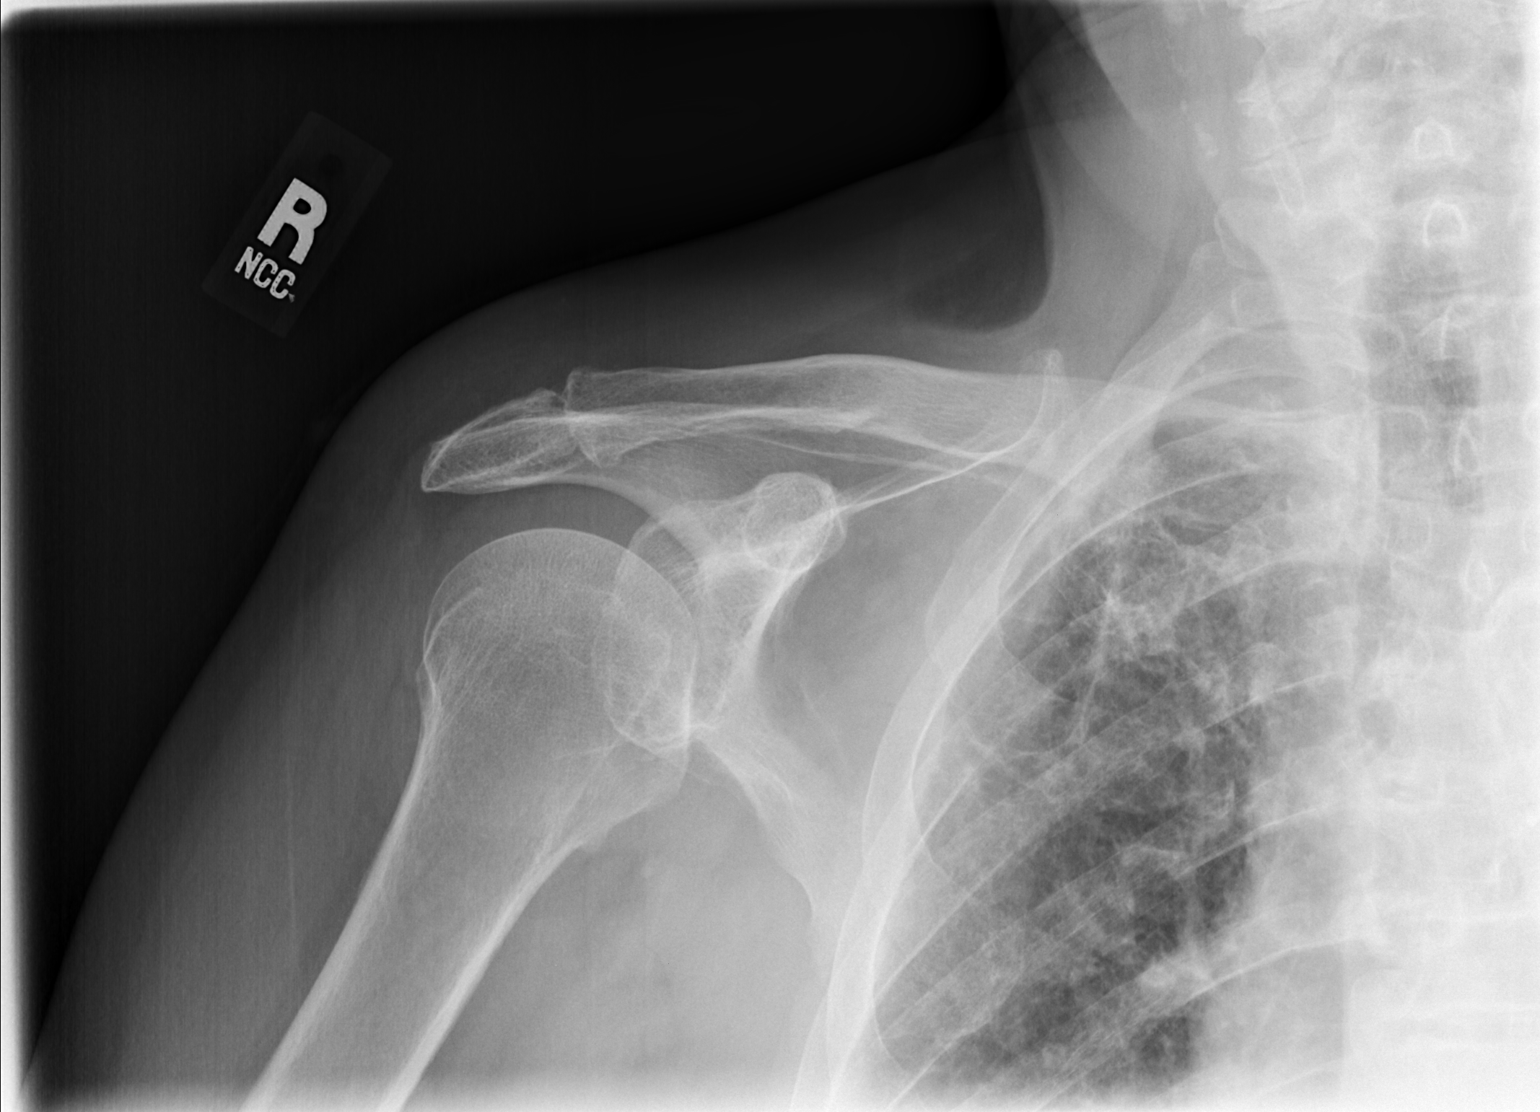

[w shoulder y view right (2 of 2)]
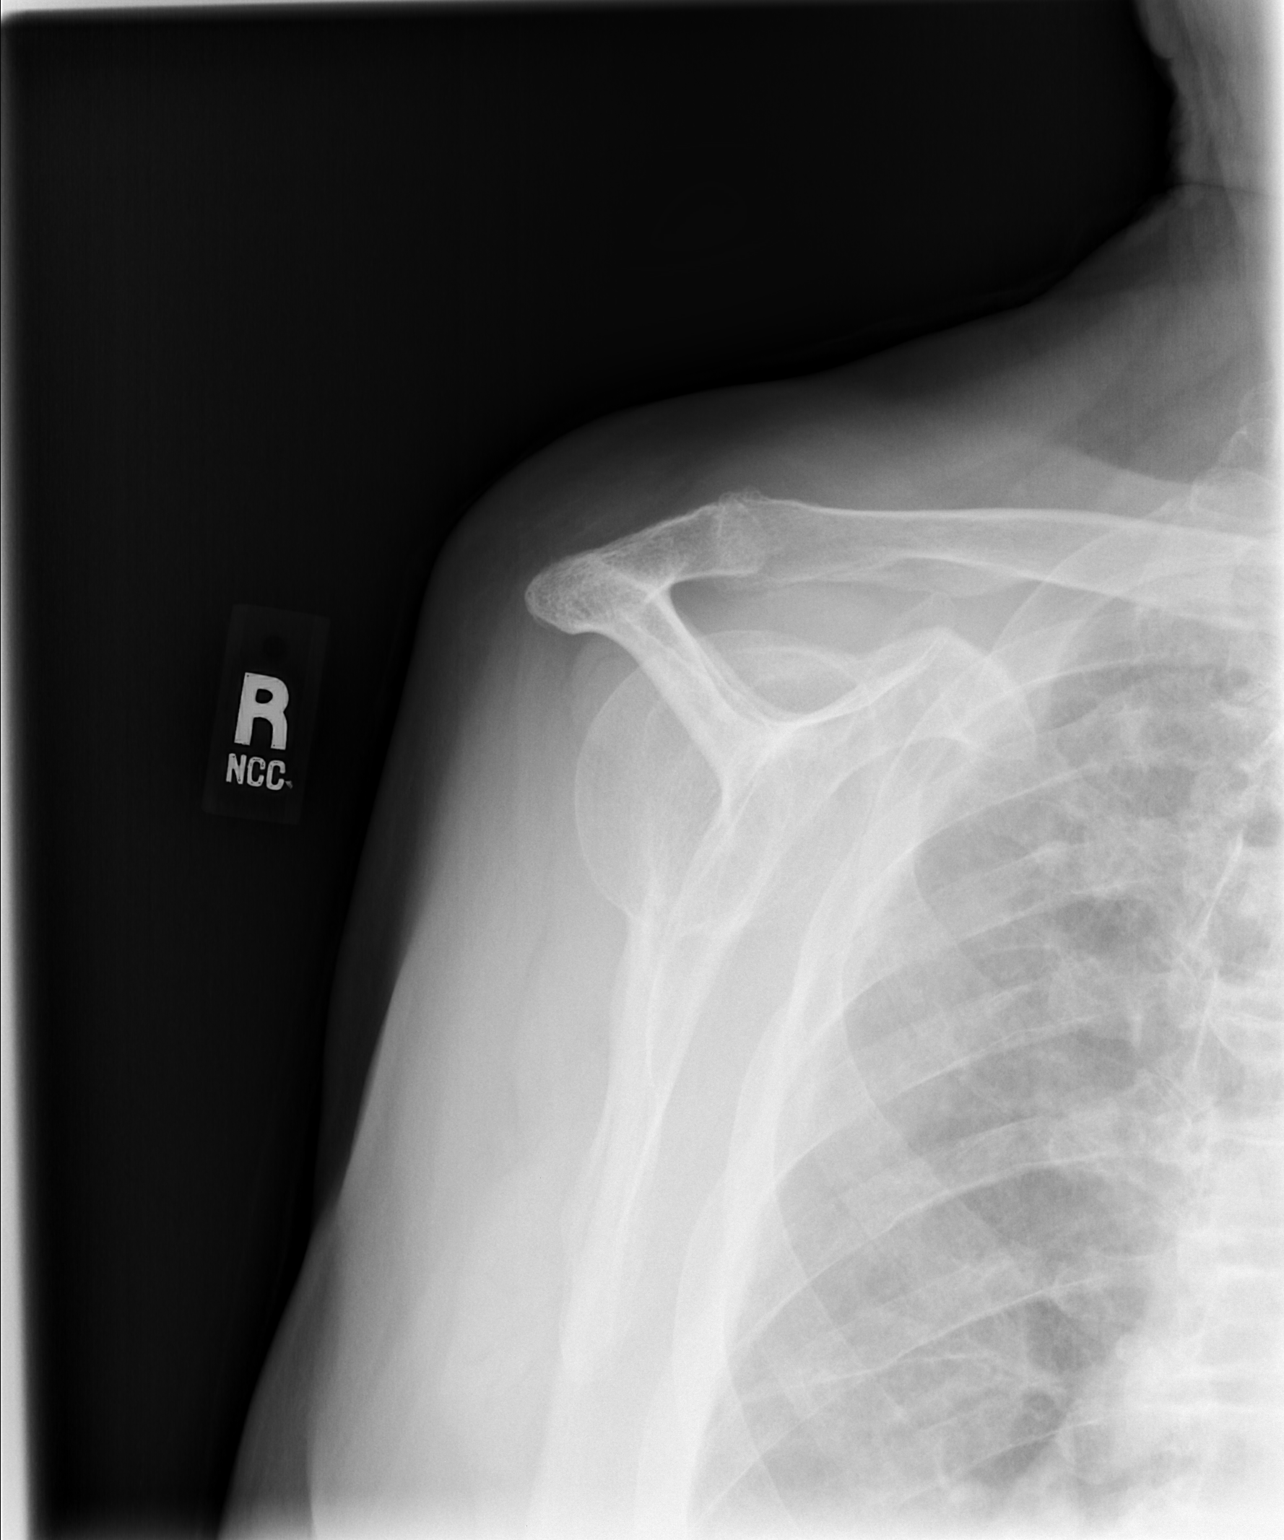

[3 of 3 positions shown; findings below may reference images not displayed]

FINDINGS: No acute fracture or dislocation.  Mild degenerative
changes present at the AC joint and glenohumeral joint.  Soft
tissues are unremarkable.
IMPRESSION: No acute findings.

## 2011-04-12 IMAGING — CT CT HEAD W/O CM
1 series · 16 of 30 positions shown, 20 images · non-contrast
Comparison: None

CLINICAL DATA: Fall with head injury and scalp laceration.

CT HEAD WITHOUT CONTRAST
TECHNIQUE: Contiguous axial images were obtained from the base of
the skull through the vertex without contrast.

[Series 2: head trauma 4.8 h37s · axial · 0.45mm/px · z∈[-107,+53]mm · 16 of 36 slices shown, 20 images]
[im 2/36  brain]
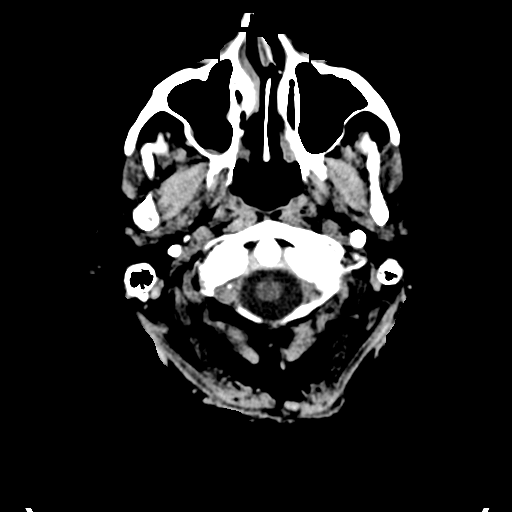
[im 2/36  bone]
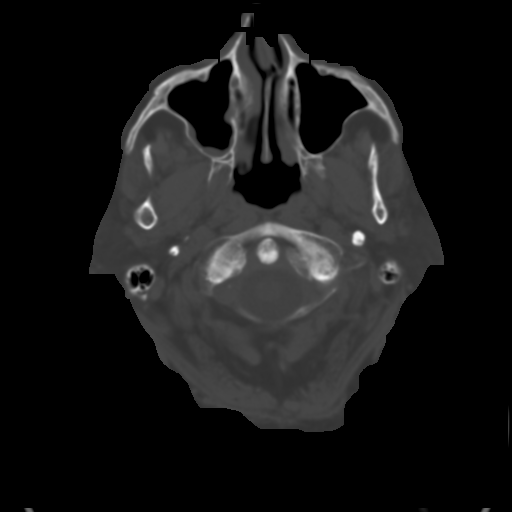
[im 4/36  brain]
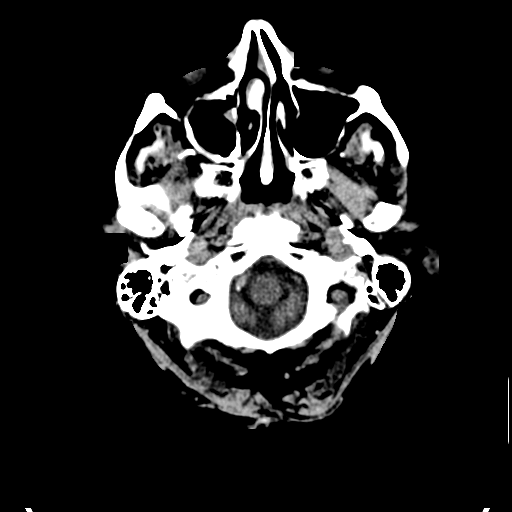
[im 7/36  brain]
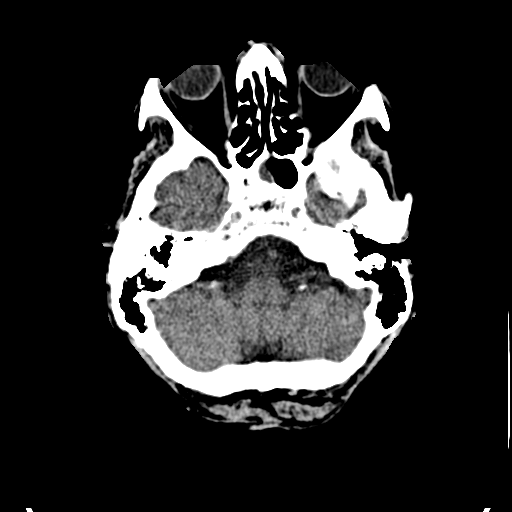
[im 9/36  brain]
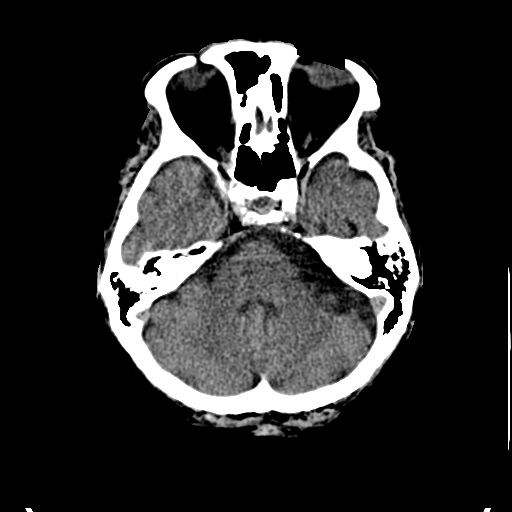
[im 10/36  brain]
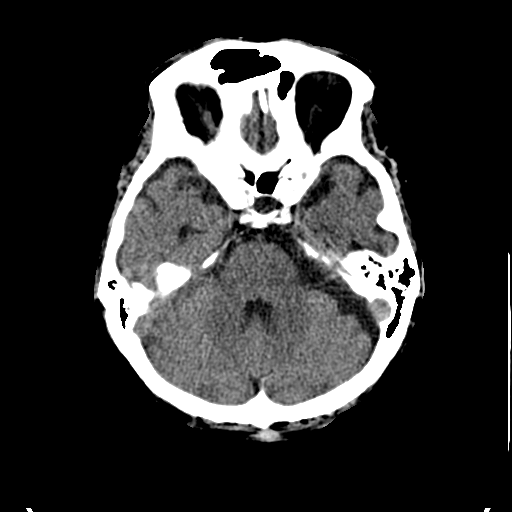
[im 10/36  bone]
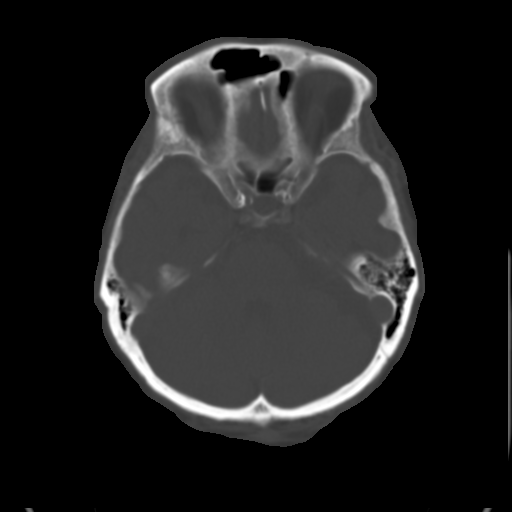
[im 13/36  brain]
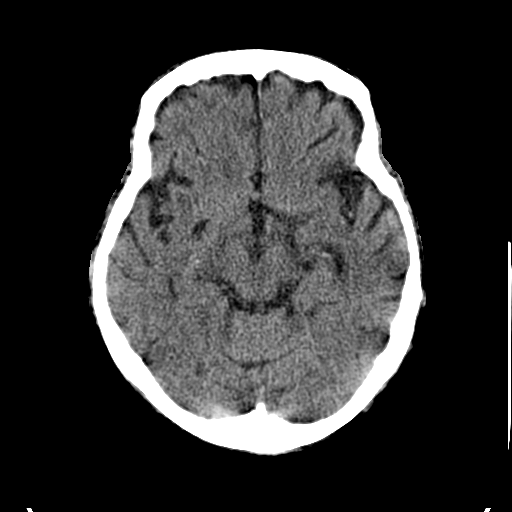
[im 15/36  brain]
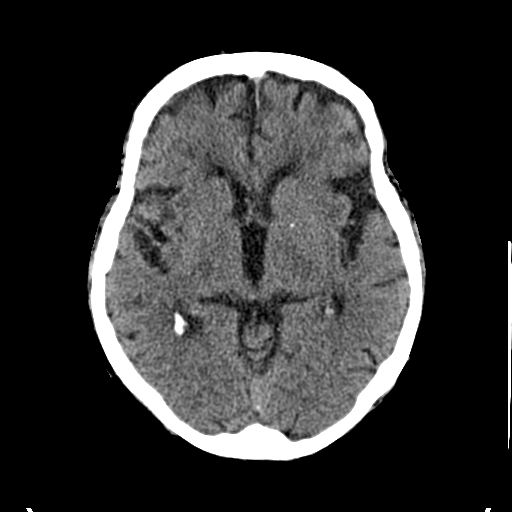
[im 17/36  brain]
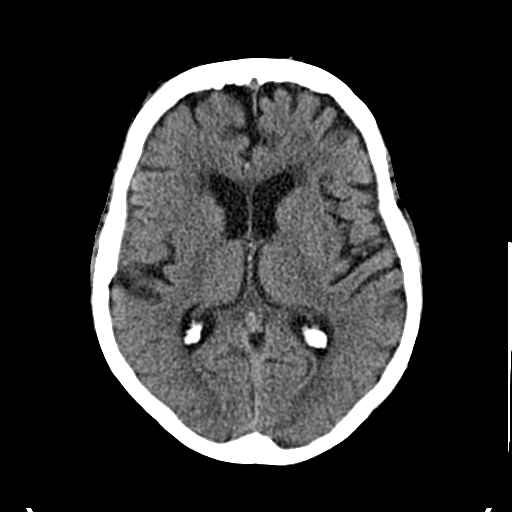
[im 19/36  brain]
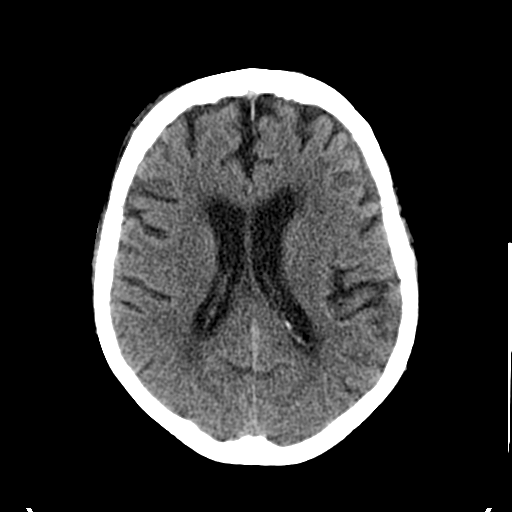
[im 19/36  bone]
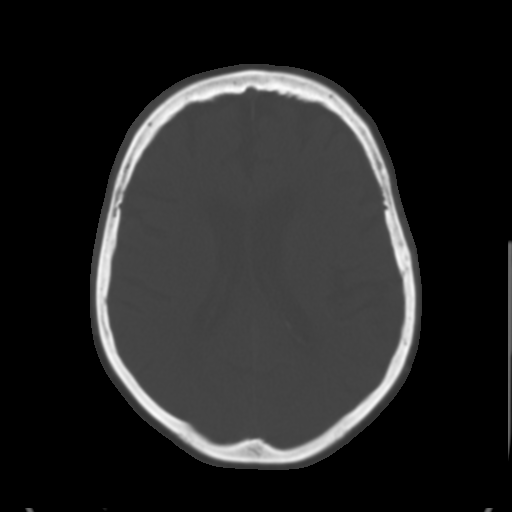
[im 21/36  brain]
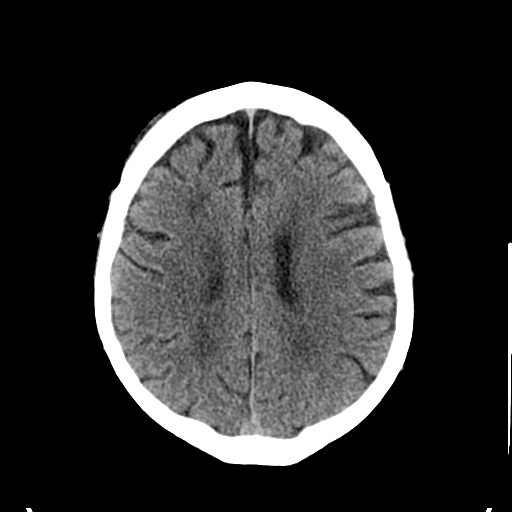
[im 23/36  brain]
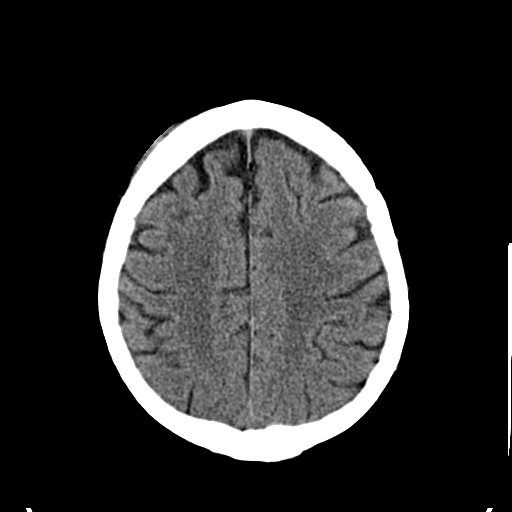
[im 26/36  brain]
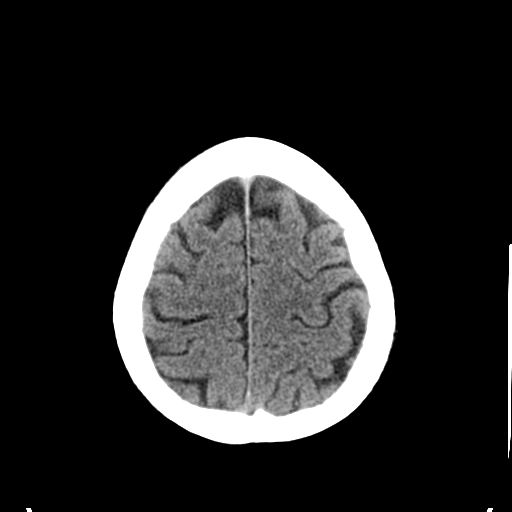
[im 27/36  brain]
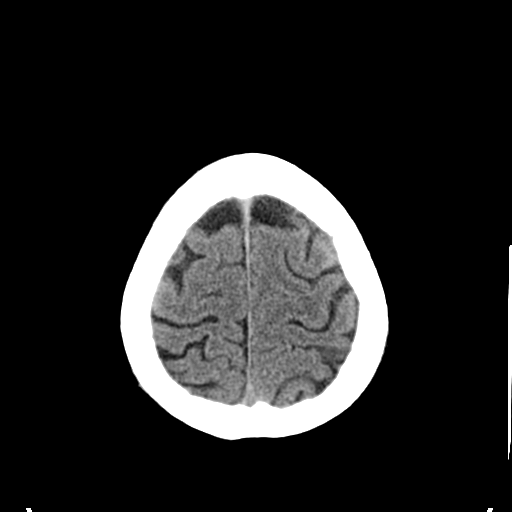
[im 27/36  bone]
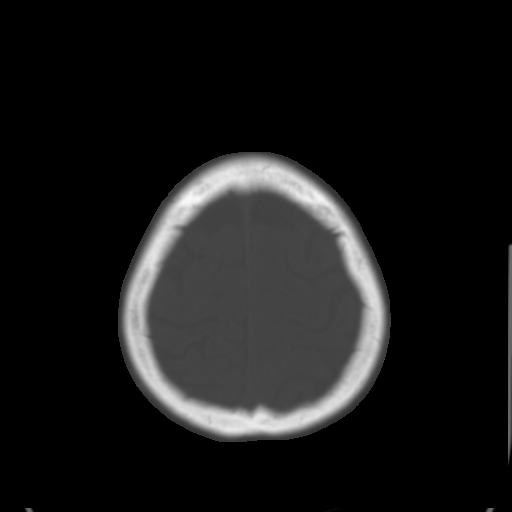
[im 29/36  brain]
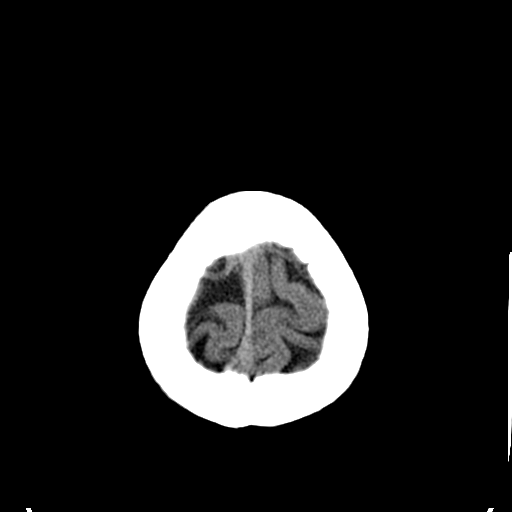
[im 32/36  brain]
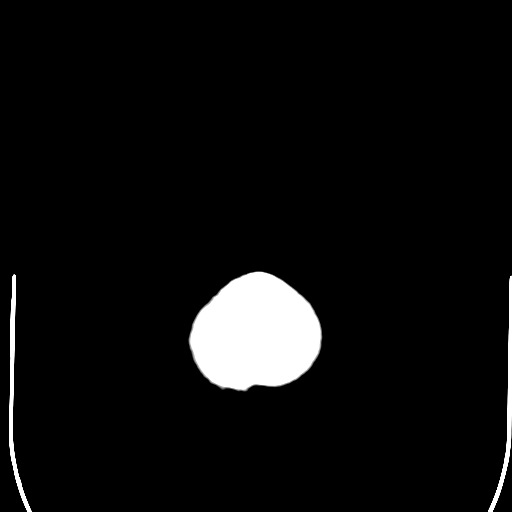
[im 34/36  brain]
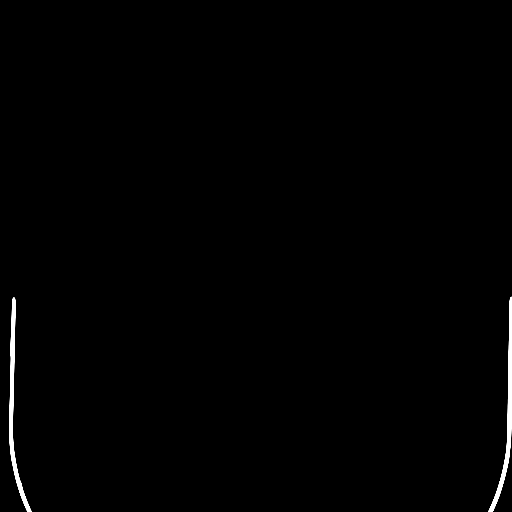

[16 of 30 positions shown; findings below may reference images not displayed]

FINDINGS: Right frontal scalp hematoma present without underlying
fracture, hemorrhage, or mass effect.  No infarction, mass lesion,
hydrocephalus or extra-axial fluid collection present.
IMPRESSION: Right frontal scalp hematoma.  No underlying skull or brain injury.

## 2011-04-12 IMAGING — CT CT CERVICAL SPINE W/O CM
3 of 4 series · 15 of 28 positions shown, 17 images · non-contrast
Comparison: Cervical spine films on the same date.

CLINICAL DATA: Trauma with neck pain.  X-rays demonstrate mild
anterolisthesis of C5 on C6.

CT CERVICAL SPINE WITHOUT CONTRAST
TECHNIQUE: Multidetector CT imaging of the cervical spine was
performed. Multiplanar CT image reconstructions were also
generated.

[Series 2: cervical spine · axial · 0.27mm/px · z∈[-249,-102]mm · 5 of 85 slices shown, 7 images]
[im 15/85  soft-tissue]
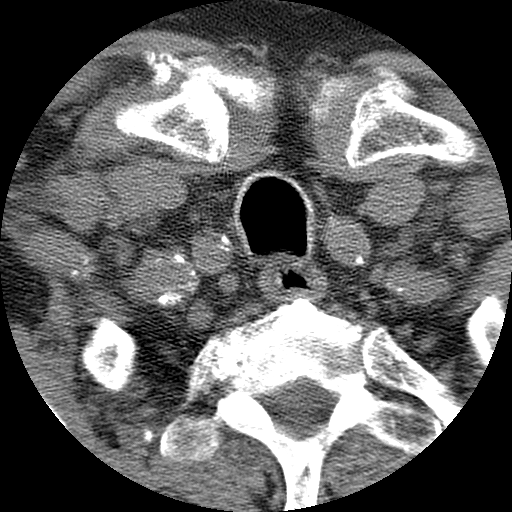
[im 15/85  bone]
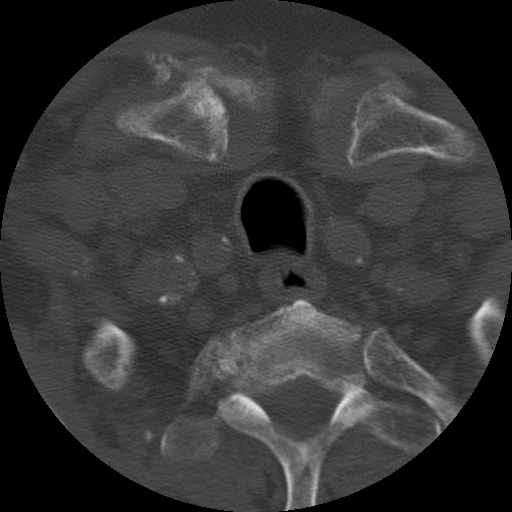
[im 29/85  bone]
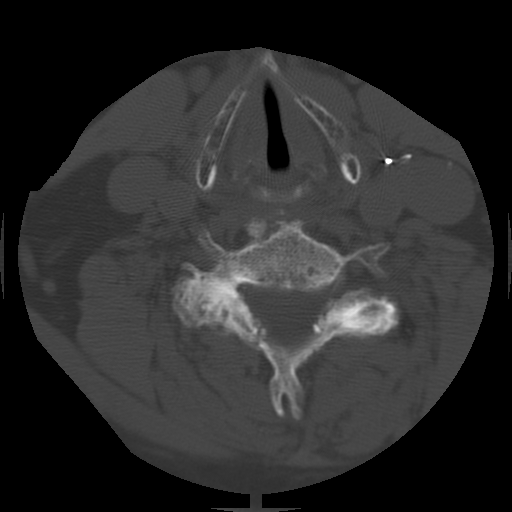
[im 43/85  bone]
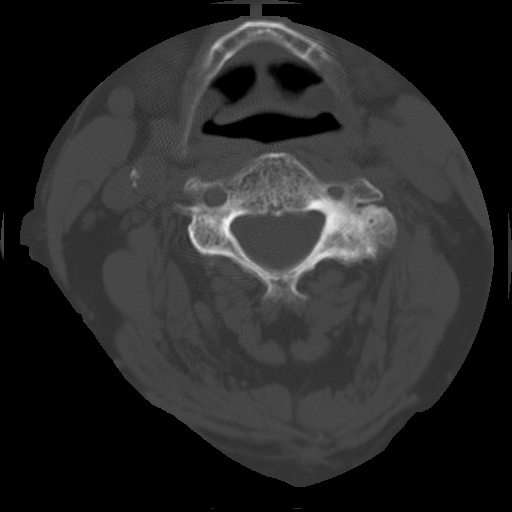
[im 57/85  bone]
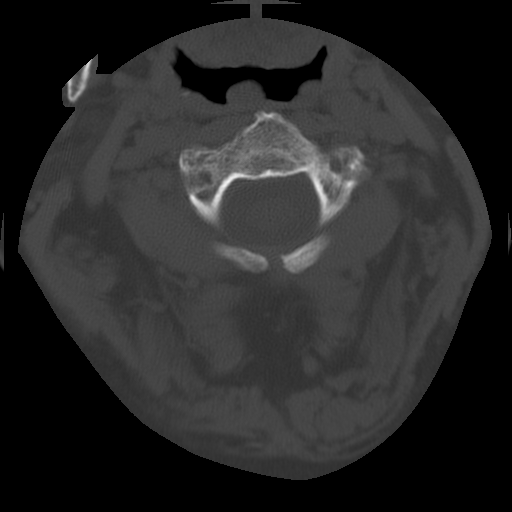
[im 71/85  soft-tissue]
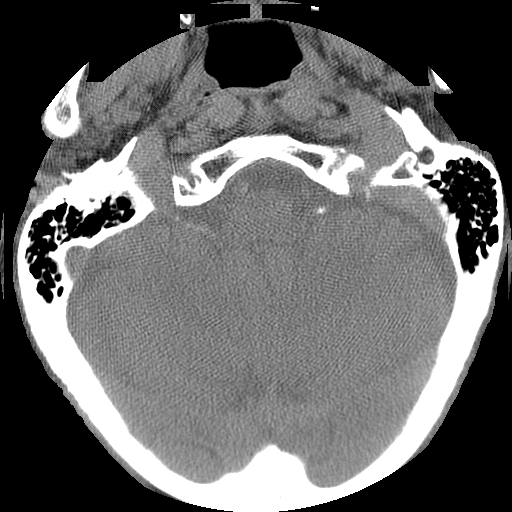
[im 71/85  bone]
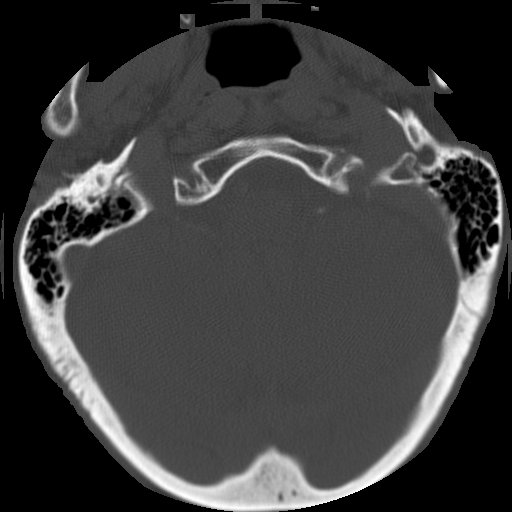

[Series 3: recon 2: cervical spine · axial · 0.27mm/px · z∈[-252,-102]mm · 5 of 84 slices shown]
[im 14/84  bone]
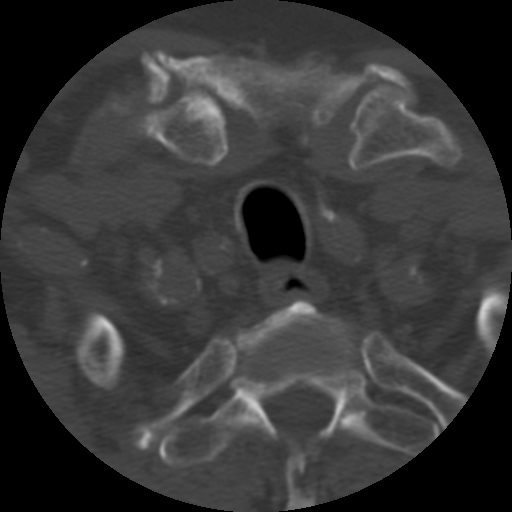
[im 28/84  bone]
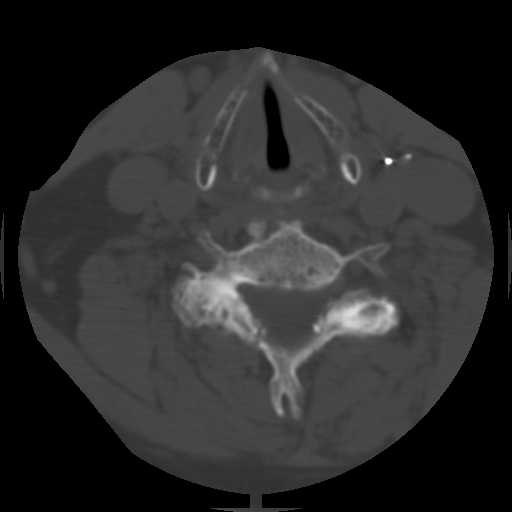
[im 42/84  bone]
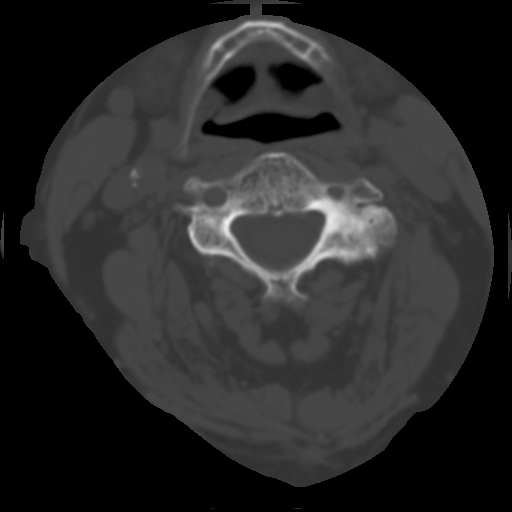
[im 56/84  bone]
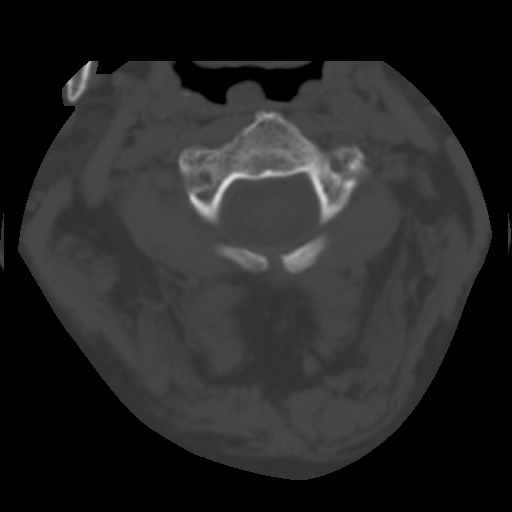
[im 70/84  bone]
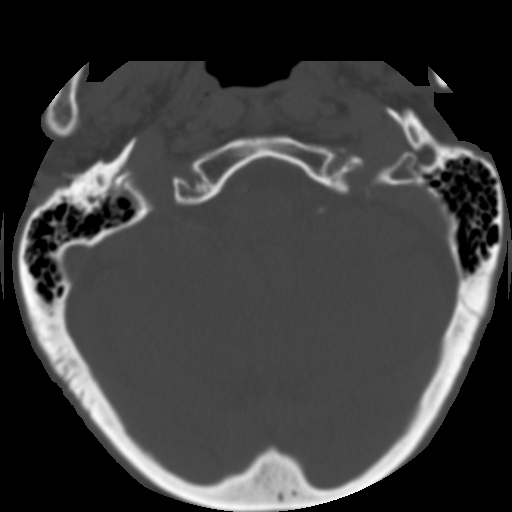

[Series 400: sag · sagittal · 0.44mm/px · 5 of 59 slices shown]
[im 10/59  bone]
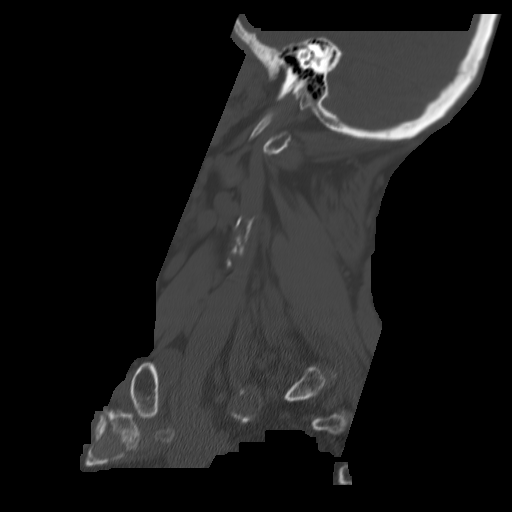
[im 20/59  bone]
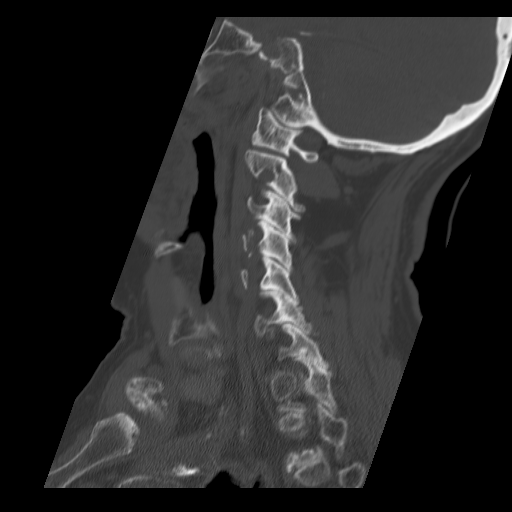
[im 30/59  bone]
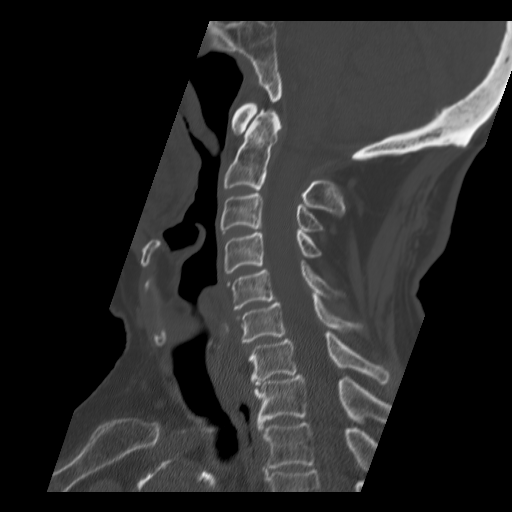
[im 39/59  bone]
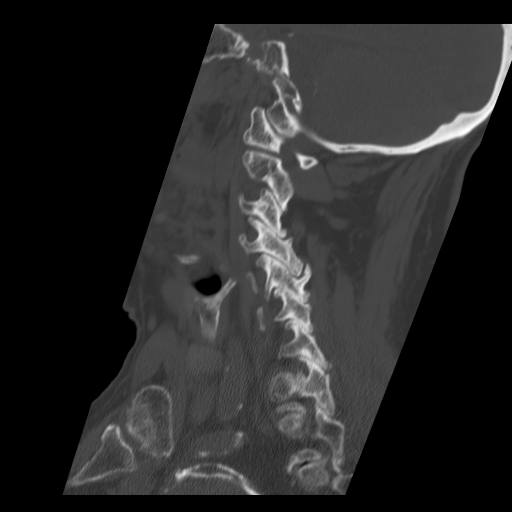
[im 49/59  bone]
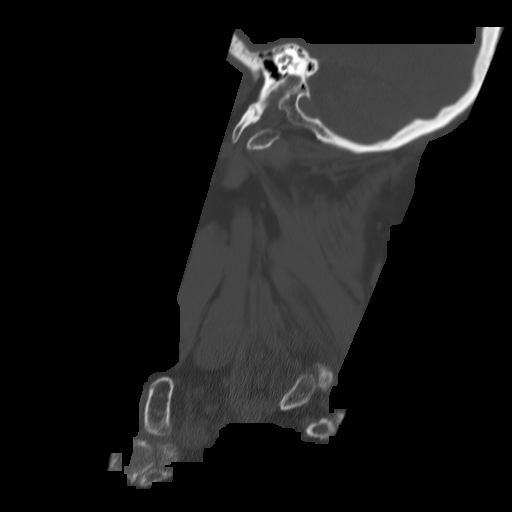

[15 of 28 positions shown; findings below may reference images not displayed]

FINDINGS: There is no evidence of acute cervical fracture.  Diffuse
spondylosis present.  Mild anterolisthesis of C5 on C6 as well as
C6 on C7 likely are on a degenerative basis.  There is no evidence
of soft tissue swelling.
IMPRESSION: Diffuse degenerative disease.  Mild anterolisthesis of C5 and C6 as
well as C6 on C7 are likely degenerative in origin based on
appearance.  No acute findings.

## 2011-05-29 ENCOUNTER — Encounter: Payer: Self-pay | Admitting: Vascular Surgery

## 2011-06-16 ENCOUNTER — Ambulatory Visit: Payer: Federal, State, Local not specified - PPO | Admitting: Vascular Surgery

## 2011-06-16 ENCOUNTER — Other Ambulatory Visit: Payer: Federal, State, Local not specified - PPO

## 2011-07-06 ENCOUNTER — Encounter: Payer: Self-pay | Admitting: Vascular Surgery

## 2011-07-07 ENCOUNTER — Other Ambulatory Visit (INDEPENDENT_AMBULATORY_CARE_PROVIDER_SITE_OTHER): Payer: Federal, State, Local not specified - PPO | Admitting: *Deleted

## 2011-07-07 ENCOUNTER — Encounter: Payer: Self-pay | Admitting: Vascular Surgery

## 2011-07-07 ENCOUNTER — Ambulatory Visit (INDEPENDENT_AMBULATORY_CARE_PROVIDER_SITE_OTHER): Payer: Federal, State, Local not specified - PPO | Admitting: Vascular Surgery

## 2011-07-07 VITALS — BP 137/73 | HR 73 | Resp 16 | Ht 68.0 in | Wt 188.0 lb

## 2011-07-07 DIAGNOSIS — Z48812 Encounter for surgical aftercare following surgery on the circulatory system: Secondary | ICD-10-CM

## 2011-07-07 DIAGNOSIS — I6529 Occlusion and stenosis of unspecified carotid artery: Secondary | ICD-10-CM

## 2011-07-07 NOTE — Progress Notes (Signed)
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 He is here today with his wife. He continues to be active at his age of 85 but is slowing down. His wife feels that this is he has some depression related to this. He has had several episodes of syncope but no focal neurologic deficits. He has no cardiac difficulties.  Past Medical History  Diagnosis Date  . Hypertension   . Hyperlipidemia   . Carotid artery occlusion   . Arthritis   . AAA (abdominal aortic aneurysm)   . Valvular heart disease     History  Substance Use Topics  . Smoking status: Former Smoker    Types: Cigarettes    Quit date: 10/06/1967  . Smokeless tobacco: Not on file  . Alcohol Use: 3.6 oz/week    6 Cans of beer per week    Family History  Problem Relation Age of Onset  . Heart disease Father   . Stroke Mother   . Arthritis Mother   . Hypertension Mother   . Diabetes Maternal Aunt     No Known Allergies  Current outpatient prescriptions:AmLODIPine Besylate (NORVASC PO), Take 10 mg by mouth daily. , Disp: , Rfl: ;  aspirin 81 MG tablet, Take 81 mg by mouth daily.  , Disp: , Rfl: ;  BuPROPion HCl (WELLBUTRIN PO), Take by mouth daily.  , Disp: , Rfl: ;  Escitalopram Oxalate (LEXAPRO PO), Take by mouth daily.  , Disp: , Rfl: ;  furosemide (LASIX) 80 MG tablet, Take 80 mg by mouth 2 (two) times daily.  , Disp: , Rfl:  Ramipril (ALTACE PO), Take 2.5 mg by mouth daily. , Disp: , Rfl: ;  cyclobenzaprine (FLEXERIL) 10 MG tablet, Take 10 mg by mouth 3 (three) times daily as needed.  , Disp: , Rfl: ;  lisinopril (PRINIVIL,ZESTRIL) 20 MG tablet, daily. , Disp: , Rfl: ;  propoxyphene-acetaminophen (DARVOCET-N 100) 100-650 MG per tablet, Take 1 tablet by mouth every 6 (six) hours as needed.  , Disp: , Rfl:   BP 137/73  Pulse 73  Resp 16  Ht 5\' 8"  (1.727 m)  Wt 188 lb (85.276 kg)  BMI 28.59 kg/m2  Body mass index is 28.59 kg/(m^2).       Review of systems no  change  Carotid duplex: Widely patent left endarterectomy from 2006, moderate 40-59% right internal carotid artery stenosis.  Physical exam well-developed well-nourished white male appearing stated age in no acute distress. Well-healed left carotid endarterectomy incision with no bruits bilaterally. 2+ radial pulses. Grossly intact neurologically. Heart regular rate and rhythm without murmur. Chest clear bilaterally.  Impression and plan: Stable moderate right carotid stenosis. The patient will notify us should he develop any focal neurologic deficit. Otherwise we will see him on a yearly basis for followup duplex

## 2011-07-22 NOTE — Procedures (Unsigned)
CAROTID DUPLEX EXAM  INDICATION:  Follow up carotid artery disease.  HISTORY: Diabetes:  No. Cardiac:  No. Hypertension:  Yes. Smoking:  Previous. Previous Surgery:  Left carotid endarterectomy, 06/17/2005. CV History:  Currently asymptomatic. Amaurosis Fugax No, Paresthesias No, Hemiparesis No.                                      RIGHT             LEFT Brachial systolic pressure:         133               130 Brachial Doppler waveforms:         Normal            Normal Vertebral direction of flow:        Antegrade         Antegrade DUPLEX VELOCITIES (cm/sec) CCA peak systolic                   53                59 ECA peak systolic                   122               87 ICA peak systolic                   189               66 ICA end diastolic                   43                20 PLAQUE MORPHOLOGY:                  Calcific PLAQUE AMOUNT:                      Moderate          None PLAQUE LOCATION:                    ICA  IMPRESSION: 1. Right internal carotid artery velocities suggest 40% to 59%     stenosis. 2. Patent left carotid endarterectomy site with no evidence of     restenosis of the internal carotid artery. 3. Antegrade vertebral arteries bilaterally.  ___________________________________________ Larina Earthly, M.D.  EM/MEDQ  D:  07/07/2011  T:  07/07/2011  Job:  [id number]

## 2011-08-10 ENCOUNTER — Other Ambulatory Visit: Payer: Self-pay | Admitting: Geriatric Medicine

## 2011-08-10 DIAGNOSIS — M545 Low back pain: Secondary | ICD-10-CM

## 2011-08-18 ENCOUNTER — Inpatient Hospital Stay: Admission: RE | Admit: 2011-08-18 | Payer: Federal, State, Local not specified - PPO | Source: Ambulatory Visit

## 2011-08-18 ENCOUNTER — Ambulatory Visit
Admission: RE | Admit: 2011-08-18 | Discharge: 2011-08-18 | Disposition: A | Discharge status: Home / Self Care | Payer: Federal, State, Local not specified - PPO | Source: Ambulatory Visit | Attending: Geriatric Medicine | Admitting: Geriatric Medicine

## 2011-08-18 DIAGNOSIS — M545 Low back pain: Secondary | ICD-10-CM

## 2011-08-31 ENCOUNTER — Encounter (HOSPITAL_COMMUNITY): Payer: Self-pay | Admitting: Internal Medicine

## 2011-08-31 ENCOUNTER — Inpatient Hospital Stay (HOSPITAL_COMMUNITY)
Admission: AD | Admit: 2011-08-31 | Discharge: 2011-09-07 | DRG: 243 | Disposition: A | Discharge status: Skilled Nursing Facility | Payer: Federal, State, Local not specified - PPO | Source: Ambulatory Visit | Attending: Internal Medicine | Admitting: Internal Medicine

## 2011-08-31 DIAGNOSIS — I10 Essential (primary) hypertension: Secondary | ICD-10-CM | POA: Diagnosis present

## 2011-08-31 DIAGNOSIS — Z7982 Long term (current) use of aspirin: Secondary | ICD-10-CM

## 2011-08-31 DIAGNOSIS — M545 Low back pain, unspecified: Principal | ICD-10-CM | POA: Diagnosis present

## 2011-08-31 DIAGNOSIS — Z79899 Other long term (current) drug therapy: Secondary | ICD-10-CM

## 2011-08-31 DIAGNOSIS — S63259A Unspecified dislocation of unspecified finger, initial encounter: Secondary | ICD-10-CM | POA: Diagnosis not present

## 2011-08-31 DIAGNOSIS — X58XXXA Exposure to other specified factors, initial encounter: Secondary | ICD-10-CM | POA: Diagnosis not present

## 2011-08-31 DIAGNOSIS — M25476 Effusion, unspecified foot: Secondary | ICD-10-CM | POA: Diagnosis not present

## 2011-08-31 DIAGNOSIS — E785 Hyperlipidemia, unspecified: Secondary | ICD-10-CM | POA: Diagnosis present

## 2011-08-31 DIAGNOSIS — Z531 Procedure and treatment not carried out because of patient's decision for reasons of belief and group pressure: Secondary | ICD-10-CM

## 2011-08-31 DIAGNOSIS — F341 Dysthymic disorder: Secondary | ICD-10-CM | POA: Diagnosis present

## 2011-08-31 DIAGNOSIS — M25473 Effusion, unspecified ankle: Secondary | ICD-10-CM | POA: Diagnosis not present

## 2011-08-31 DIAGNOSIS — Z87891 Personal history of nicotine dependence: Secondary | ICD-10-CM

## 2011-08-31 HISTORY — DX: Procedure and treatment not carried out because of patient's decision for reasons of belief and group pressure: Z53.1

## 2011-08-31 HISTORY — DX: Nightmare disorder: F51.5

## 2011-08-31 HISTORY — DX: Anxiety disorder, unspecified: F41.9

## 2011-08-31 HISTORY — DX: Malignant (primary) neoplasm, unspecified: C80.1

## 2011-08-31 HISTORY — DX: Depression, unspecified: F32.A

## 2011-08-31 HISTORY — DX: Major depressive disorder, single episode, unspecified: F32.9

## 2011-08-31 HISTORY — DX: Post-traumatic stress disorder, unspecified: F43.10

## 2011-08-31 HISTORY — DX: Spinal stenosis, site unspecified: M48.00

## 2011-08-31 LAB — COMPREHENSIVE METABOLIC PANEL
ALT: 20 U/L (ref 0–53)
AST: 27 U/L (ref 0–37)
Alkaline Phosphatase: 70 U/L (ref 39–117)
CO2: 27 mEq/L (ref 19–32)
Calcium: 9.3 mg/dL (ref 8.4–10.5)
GFR calc Af Amer: 54 mL/min — ABNORMAL LOW (ref >90)
GFR calc non Af Amer: 47 mL/min — ABNORMAL LOW (ref >90)
Glucose, Bld: 127 mg/dL — ABNORMAL HIGH (ref 70–99)
Potassium: 3.7 mEq/L (ref 3.5–5.1)
Sodium: 144 mEq/L (ref 135–145)

## 2011-08-31 LAB — MAGNESIUM: Magnesium: 2.4 mg/dL (ref 1.5–2.5)

## 2011-08-31 LAB — CBC
HCT: 40.9 % (ref 39.0–52.0)
Hemoglobin: 13.4 g/dL (ref 13.0–17.0)
MCH: 29.4 pg (ref 26.0–34.0)
MCHC: 32.8 g/dL (ref 30.0–36.0)
RDW: 15 % (ref 11.5–15.5)

## 2011-08-31 MED ORDER — LORAZEPAM 2 MG/ML IJ SOLN: 1.0000 mg | Freq: Four times a day (QID) | INTRAMUSCULAR | Status: AC | PRN

## 2011-08-31 MED ORDER — FOLIC ACID 1 MG PO TABS
1.0000 mg | ORAL_TABLET | Freq: Every day | ORAL | Status: DC
  Administered 2011-08-31 – 2011-09-07 (×8): 1 mg via ORAL
  Filled 2011-08-31 (×8): qty 1

## 2011-08-31 MED ORDER — ACETAMINOPHEN 650 MG RE SUPP: 650.0000 mg | Freq: Four times a day (QID) | RECTAL | Status: DC | PRN

## 2011-08-31 MED ORDER — ONDANSETRON HCL 4 MG/2ML IJ SOLN: 4.0000 mg | Freq: Four times a day (QID) | INTRAMUSCULAR | Status: DC | PRN

## 2011-08-31 MED ORDER — ONDANSETRON HCL 4 MG PO TABS: 4.0000 mg | ORAL_TABLET | Freq: Four times a day (QID) | ORAL | Status: DC | PRN

## 2011-08-31 MED ORDER — THIAMINE HCL 100 MG/ML IJ SOLN
100.0000 mg | Freq: Every day | INTRAMUSCULAR | Status: DC
  Filled 2011-08-31 (×5): qty 1

## 2011-08-31 MED ORDER — HYDRALAZINE HCL 20 MG/ML IJ SOLN
10.0000 mg | INTRAMUSCULAR | Status: DC | PRN
  Administered 2011-09-01 (×2): 10 mg via INTRAVENOUS
  Filled 2011-08-31 (×2): qty 0.5

## 2011-08-31 MED ORDER — OXYCODONE HCL 5 MG PO TABS
5.0000 mg | ORAL_TABLET | ORAL | Status: DC | PRN
  Administered 2011-08-31: 5 mg via ORAL
  Filled 2011-08-31: qty 1

## 2011-08-31 MED ORDER — ACETAMINOPHEN 325 MG PO TABS: 650.0000 mg | ORAL_TABLET | Freq: Four times a day (QID) | ORAL | Status: DC | PRN

## 2011-08-31 MED ORDER — BUPROPION HCL 100 MG PO TABS
100.0000 mg | ORAL_TABLET | Freq: Every day | ORAL | Status: DC
  Administered 2011-08-31 – 2011-09-06 (×7): 100 mg via ORAL
  Filled 2011-08-31 (×8): qty 1

## 2011-08-31 MED ORDER — HYDROMORPHONE HCL PF 1 MG/ML IJ SOLN
0.5000 mg | INTRAMUSCULAR | Status: DC | PRN
  Administered 2011-09-01 (×2): 0.5 mg via INTRAVENOUS
  Filled 2011-08-31 (×2): qty 1

## 2011-08-31 MED ORDER — TRIAMCINOLONE ACETONIDE 0.025 % EX CREA
1.0000 "application " | TOPICAL_CREAM | Freq: Every day | CUTANEOUS | Status: DC | PRN
  Filled 2011-08-31: qty 15

## 2011-08-31 MED ORDER — ESCITALOPRAM OXALATE 10 MG PO TABS
10.0000 mg | ORAL_TABLET | Freq: Every day | ORAL | Status: DC
  Administered 2011-09-01 – 2011-09-07 (×7): 10 mg via ORAL
  Filled 2011-08-31 (×8): qty 1

## 2011-08-31 MED ORDER — VITAMIN B-1 100 MG PO TABS
100.0000 mg | ORAL_TABLET | Freq: Every day | ORAL | Status: DC
  Administered 2011-08-31 – 2011-09-07 (×8): 100 mg via ORAL
  Filled 2011-08-31 (×8): qty 1

## 2011-08-31 MED ORDER — LORAZEPAM 1 MG PO TABS: 1.0000 mg | ORAL_TABLET | Freq: Four times a day (QID) | ORAL | Status: AC | PRN

## 2011-08-31 MED ORDER — ROSUVASTATIN CALCIUM 20 MG PO TABS
20.0000 mg | ORAL_TABLET | Freq: Every day | ORAL | Status: DC
  Administered 2011-08-31 – 2011-09-06 (×7): 20 mg via ORAL
  Filled 2011-08-31 (×8): qty 1

## 2011-08-31 MED ORDER — THERA M PLUS PO TABS
1.0000 | ORAL_TABLET | Freq: Every day | ORAL | Status: DC
  Administered 2011-09-01 – 2011-09-07 (×6): 1 via ORAL
  Filled 2011-08-31 (×8): qty 1

## 2011-08-31 MED ORDER — ASPIRIN EC 81 MG PO TBEC
81.0000 mg | DELAYED_RELEASE_TABLET | Freq: Every day | ORAL | Status: DC
  Administered 2011-08-31 – 2011-09-07 (×8): 81 mg via ORAL
  Filled 2011-08-31 (×8): qty 1

## 2011-08-31 MED ORDER — ASPIRIN 81 MG PO TABS: 81.0000 mg | ORAL_TABLET | Freq: Every day | ORAL | Status: DC

## 2011-08-31 MED ORDER — THERA M PLUS PO TABS: 1.0000 | ORAL_TABLET | Freq: Every day | ORAL | Status: DC

## 2011-08-31 MED ORDER — SODIUM CHLORIDE 0.9 % IV SOLN
INTRAVENOUS | Status: DC
  Administered 2011-08-31 – 2011-09-01 (×3): via INTRAVENOUS

## 2011-08-31 NOTE — H&P (Signed)
[name] [name] is an 77 y.o. male.   Chief Complaint: Low back pain. HPI: 77 year old male with history of hypertension hyperlipidemia carotid artery occlusion status post left-sided carotid endarterectomy, history of abdominal at aneurysm repair has been experiencing low back pain worsening over the last 3-4 days. [name] states that he has been having low back pain for long time and was told he had degenerative discs in his low back. He has had an MRI of his lumbosacral spine on November 13 the results of which I am not able to access. [name] states that the results are showing some impingement of nerve root. Over the last 3-4 days as patient's pain is got worse they did call on call physician who called in hydrocodone. Hydrocodone helped initially but patient's pain became worse that they had to call EMS to take the patient to the PCPs office today. Since he is having intractable pain he has been directly admitted to [facility] for further workup. The pain is usually low back radiates to the right leg. He has not had any incontinence of urine or bowel. Patient denies any fever chills, chest pain or shortness of breath, denies any nausea vomiting or bowel pain dysuria discharges or diarrhea. Patient has been having recurrent syncopal episodes. The last one was 4 weeks ago. When they checked his orthostatics he was found to be severely orthostatic with blood pressure systolic dropping from 120 to the 70s. His PCP Dr. Pete Glatter discontinued his antihypertensives. After which he has not had any further syncopal episodes. Today on admission his blood pressure was found to be high with systolic around 180s.  Past Medical History  Diagnosis Date  . Hypertension   . Hyperlipidemia   . Carotid artery occlusion   . Arthritis   . AAA (abdominal aortic aneurysm)   . Valvular heart disease     Past Surgical History  Procedure Date  . Wrist surgery 2003  . Abdominal aortic aneurysm repair  2002  . Carotid endarterectomy 2004    left    Family History  Problem Relation Age of Onset  . Heart disease Father   . Stroke Mother   . Arthritis Mother   . Hypertension Mother   . Diabetes Maternal Aunt    Social History:  reports that he quit smoking about 43 years ago. His smoking use included Cigarettes. He does not have any smokeless tobacco history on file. He reports that he drinks about 3.6 ounces of alcohol per week. His drug history not on file.  Allergies:  Allergies  Allergen Reactions  . Morphine And Related     Causes confusion    Medications Prior to Admission  Medication Dose Route Frequency Provider Last Rate Last Dose  . 0.9 %  sodium chloride infusion   Intravenous Continuous Eduard Clos      . acetaminophen (TYLENOL) tablet 650 mg  650 mg Oral Q6H PRN Eduard Clos       Or  . acetaminophen (TYLENOL) suppository 650 mg  650 mg Rectal Q6H PRN Eduard Clos      . aspirin tablet 81 mg  81 mg Oral Daily Eduard Clos      . buPROPion [facility]) tablet 100 mg  100 mg Oral Daily Eduard Clos      . escitalopram (LEXAPRO) tablet 10 mg  10 mg Oral Daily Eduard Clos      . folic acid (FOLVITE) tablet 1 mg  1 mg Oral  Daily Eduard Clos      . hydrALAZINE (APRESOLINE) injection 10 mg  10 mg Intravenous Q4H PRN Eduard Clos      . HYDROmorphone (DILAUDID) injection 0.5 mg  0.5 mg Intravenous Q4H PRN Eduard Clos      . LORazepam (ATIVAN) tablet 1 mg  1 mg Oral Q6H PRN Eduard Clos       Or  . LORazepam (ATIVAN) injection 1 mg  1 mg Intravenous Q6H PRN Eduard Clos      . multivitamins ther. w/minerals tablet 1 tablet  1 tablet Oral Daily Eduard Clos      . multivitamins ther. w/minerals tablet 1 tablet  1 tablet Oral Daily Eduard Clos      . ondansetron (ZOFRAN) tablet 4 mg  4 mg Oral Q6H PRN Eduard Clos       Or  . ondansetron (ZOFRAN) injection 4  mg  4 mg Intravenous Q6H PRN Eduard Clos      . oxyCODONE (Oxy IR/ROXICODONE) immediate release tablet 5 mg  5 mg Oral Q4H PRN Eduard Clos      . rosuvastatin (CRESTOR) tablet 20 mg  20 mg Oral Daily Eduard Clos      . thiamine (VITAMIN B-1) tablet 100 mg  100 mg Oral Daily Eduard Clos       Or  . thiamine (B-1) injection 100 mg  100 mg Intravenous Daily Eduard Clos      . triamcinolone (KENALOG) 0.025 % cream 1 application  1 application Topical Daily PRN Eduard Clos       Medications Prior to Admission  Medication Sig Dispense Refill  . aspirin 81 MG tablet Take 81 mg by mouth daily.        . CRESTOR 20 MG tablet Take 20 mg by mouth Daily.        No results found for this or any previous visit (from the past 48 hour(s)). No results found.  Review of Systems  Constitutional: Negative.   HENT: Negative.   Eyes: Negative.   Respiratory: Negative.   Cardiovascular: Negative.   Gastrointestinal: Negative.   Genitourinary: Negative.   Musculoskeletal: Positive for back pain.       Difficulty walking due to low back pain.  Skin: Negative.   Neurological: Negative.   Psychiatric/Behavioral: Negative.     Blood pressure 186/105, pulse 74, temperature 97.7 F (36.5 C), temperature source Oral, resp. rate 18, SpO2 94.00%. Physical Exam  Constitutional: He is oriented to person, place, and time. He appears well-developed and well-nourished.  HENT:  Head: Normocephalic and atraumatic.  Right Ear: External ear normal.  Left Ear: External ear normal.  Nose: Nose normal.  Mouth/Throat: Oropharynx is clear and moist.  Eyes: Conjunctivae and EOM are normal. Pupils are equal, round, and reactive to light.  Neck: Normal range of motion. Neck supple.  Cardiovascular: Normal rate, regular rhythm, normal heart sounds and intact distal pulses.   Respiratory: Effort normal and breath sounds normal.  GI: Soft. Bowel sounds are normal.    Musculoskeletal:       Has pain in the low back when he raises his left leg.  Neurological: He is alert and oriented to person, place, and time. He has normal reflexes. No cranial nerve deficit. Coordination normal.  Skin: Skin is warm and dry.  Psychiatric: His behavior is normal.     Assessment/Plan #1. Low back pain. #2. Uncontrolled hypertension. #3. History  of left-sided carotid endarterectomy. #4. History of recurrent syncopal episodes, found to be orthostatic which improved with discontinuing antihypertensives. #5. History of abdominal aortic aneurysm repair. #6. History of hyperlipidemia. #7. History of alcoholism and former tobacco abuse.  Plan Admit to medical floor For his low back pain we will keep patient on as needed pain relief medications. I have added OxyIR and Dilaudid. I did confirm with patient's family that patient can tolerate Dilaudid. [name] blood pressure is uncontrolled. For now I think there will be a contribution from his pain. For which I am going to add hydralazine IV when necessary. We will get a 2-D echo has patient has had recurrent syncopal episodes. I have consulted neurosurgeon on call Dr. Danielle Dess for his low back pain. Will follow his recommendations. We need to get the official MRI results.  Eduard Clos 08/31/2011, 8:51 PM

## 2011-09-01 ENCOUNTER — Inpatient Hospital Stay (HOSPITAL_COMMUNITY): Payer: Federal, State, Local not specified - PPO

## 2011-09-01 LAB — TSH
TSH: 3.563 u[IU]/mL (ref 0.350–4.500)
TSH: 1.913 u[IU]/mL (ref 0.350–4.500)

## 2011-09-01 LAB — CBC
HCT: 40.1 % (ref 39.0–52.0)
MCHC: 32.2 g/dL (ref 30.0–36.0)
MCV: 89.7 fL (ref 78.0–100.0)
Platelets: 152 10*3/uL (ref 150–400)
RDW: 15.1 % (ref 11.5–15.5)

## 2011-09-01 LAB — BASIC METABOLIC PANEL
BUN: 19 mg/dL (ref 6–23)
CO2: 24 mEq/L (ref 19–32)
Calcium: 8.9 mg/dL (ref 8.4–10.5)
Chloride: 110 mEq/L (ref 96–112)
Creatinine, Ser: 1.16 mg/dL (ref 0.50–1.35)

## 2011-09-01 LAB — CORTISOL: Cortisol, Plasma: 10.8 ug/dL

## 2011-09-01 MED ORDER — IOHEXOL 180 MG/ML  SOLN
10.0000 mL | Freq: Once | INTRAMUSCULAR | Status: AC | PRN
  Administered 2011-09-01: 2 mL via INTRAVENOUS

## 2011-09-01 MED ORDER — HYDRALAZINE HCL 20 MG/ML IJ SOLN
10.0000 mg | Freq: Four times a day (QID) | INTRAMUSCULAR | Status: DC | PRN
  Administered 2011-09-01 – 2011-09-07 (×8): 10 mg via INTRAVENOUS
  Filled 2011-09-01 (×7): qty 0.5

## 2011-09-01 NOTE — Progress Notes (Signed)
CSW received inappropriate referral for setting up aide at home. CSW is signing off but can be consulted for any CSW needs. Unk [place] [phone]

## 2011-09-01 NOTE — Progress Notes (Signed)
   CARE MANAGEMENT NOTE 09/01/2011  Patient:  [name] [name], [name] [name]   Account Number:  [id number]  Date Initiated:  09/01/2011  Documentation initiated by:  Junius Creamer  Subjective/Objective Assessment:   adm w back pain     Action/Plan:   lives w wife, pcp dr Ann Maki [place], also see phy at [facility], hx of [facility]   Anticipated DC Date:  09/03/2011   Anticipated DC Plan:  HOME W HOME HEALTH SERVICES      DC Planning Services  CM consult      [facility] Choice  HOME HEALTH   Choice offered to / List presented to:  C-3 Spouse        HH arranged  HH-2 PT      HH agency  [facility]   Status of service:   Medicare Important Message given?   (If response is "NO", the following Medicare IM given date fields will be blank) Date Medicare IM given:   Date Additional Medicare IM given:    Discharge Disposition:    Per UR Regulation:    Comments:  11/27 spoke w pt's wife, she works for [employer] w disabled vet, she is working w [facility] to get [facility] to help at [facility], she still works. hx of [facility] after knee surg, alerted debbie w [facility] may need hhpt, pt hasn't been able to walk much last 4-5 days. debbie Zhi Geier rn,bsn [id number]

## 2011-09-01 NOTE — Progress Notes (Signed)
Subjective: No fever, no chest pain, no shortness of breath. Patient reports he improve in his back pain is with current medication therapy and after receiving a spinal steroid injection.  Objective: Vital signs in last 24 hours: Temp:  [97.7 F (36.5 C)-98.6 F (37 C)] 98.2 F (36.8 C) (11/27 0930) Pulse Rate:  [63-78] 78  (11/27 0930) Resp:  [18] 18  (11/27 0930) BP: (163-222)/(63-105) 163/67 mmHg (11/27 0930) SpO2:  [93 %-95 %] 95 % (11/27 0930) Weight:  [85.276 kg (188 lb)] 188 lb (85.276 kg) (11/26 2251) Weight change:  Last BM Date: 08/27/11  Intake/Output from previous day: 11/26 0701 - 11/27 0700 In: 990 [P.O.:240; I.V.:750] Out: -      Physical Exam: General: Alert, awake, oriented x3, in no acute distress. HEENT: No bruits, no goiter. Heart: Regular rate and rhythm, without murmurs, rubs, gallops. Lungs: Clear to auscultation bilaterally. Abdomen: Soft, nontender, nondistended, positive bowel sounds. Extremities: No clubbing cyanosis or edema with positive pedal pulses. Neuro: Grossly intact, nonfocal.   Lab Results: Basic Metabolic Panel:  Basename 09/01/11 0735 08/31/11 2056  NA 143 144  K 3.5 3.7  CL 110 109  CO2 24 27  GLUCOSE 87 127*  BUN 19 24*  CREATININE 1.16 1.33  CALCIUM 8.9 9.3  MG -- 2.4  PHOS -- --   Liver Function Tests:  [facility] 08/31/11 2056  AST 27  ALT 20  ALKPHOS 70  BILITOT 0.3  PROT 6.3  ALBUMIN 3.5   CBC:  Basename 09/01/11 0735 08/31/11 2056  WBC 7.6 8.0  NEUTROABS -- --  HGB 12.9* 13.4  HCT 40.1 40.9  MCV 89.7 89.7  PLT 152 162   Thyroid Function Tests:  Basename 08/31/11 2056  TSH 3.563  T4TOTAL --  FREET4 --  T3FREE --  THYROIDAB --    Studies/Results: No results found.  Medications: Scheduled Meds:   . aspirin EC  81 mg Oral Daily  . buPROPion  100 mg Oral Daily  . escitalopram  10 mg Oral Daily  . folic acid  1 mg Oral Daily  . multivitamins ther. w/minerals  1 tablet Oral Daily  .  rosuvastatin  20 mg Oral QHS  . thiamine  100 mg Oral Daily   Or  . thiamine  100 mg Intravenous Daily  . DISCONTD: aspirin  81 mg Oral Daily  . DISCONTD: multivitamins ther. w/minerals  1 tablet Oral Daily   Continuous Infusions:   . sodium chloride 75 mL/hr at 09/01/11 1121   PRN Meds:.acetaminophen, acetaminophen, hydrALAZINE, HYDROmorphone, iohexol, LORazepam, LORazepam, ondansetron (ZOFRAN) IV, ondansetron, oxyCODONE, triamcinolone  Assessment/Plan: 1-Low back pain: Status post spinal steroid injection; pain is better controlled according to the patient with current regimen. Will follow physical therapy recommendations and also further neurosurgery recommendations.  2-Hypertension: Patient with recent syncope secondary to orthostatic hypotension, has had his antihypertensive drugs discontinue by primary care physician. He is having moderate hypertension in the setting of acute pain, will control his pain and use PRN hydralazine for now. If blood pressure continued to be elevated we may start low-dose amlodipine. Continue heart healthy diet.  3-Hyperlipidemia: Continue statins.  4-depression/anxiety: Continue the use of bupropion and Lexapro.  5-history of syncope: Will avoid to schedule antihypertensive medications, and will follow 2-D echo.  6-DVT: SCDs.   LOS: 1 day   Adna Nofziger 09/01/2011, 11:23 AM

## 2011-09-01 NOTE — Progress Notes (Signed)
Utilization Review Completed.  [name] [name] T  09/01/2011 

## 2011-09-01 NOTE — Progress Notes (Signed)
Physical Therapy Evaluation Patient Details Name: [name] [name] MRN: [id number] DOB: [birth year] Today's Date: 09/01/2011  Problem List:  Patient Active Problem List  Diagnoses  . Low back pain  . Hypertension  . Hyperlipidemia    Past Medical History:  Past Medical History  Diagnosis Date  . Hypertension   . Hyperlipidemia   . Carotid artery occlusion   . AAA (abdominal aortic aneurysm)   . Valvular heart disease   . Hyperlipidemia   . Cancer     "skin cancer removed from top of head"  . Arthritis     "in his spine"  . Refusal of blood transfusions as patient is Jehovah's Witness 08/31/11  . PTSD (post-traumatic stress disorder)   . Depression     "terrible; from the PTSD"  . Anxiety   . Nightmares   . Spinal stenosis    Past Surgical History:  Past Surgical History  Procedure Date  . Wrist surgery 2003    left; S/P fracture; "put a plate in it"  . Abdominal aortic aneurysm repair 2002  . Carotid endarterectomy 2004    left  . Joint replacement   . Total knee arthroplasty 2000's    bilaterally  . Cataract extraction w/ intraocular lens  implant, bilateral ~ 2010    PT Assessment/Plan/Recommendation PT Assessment Clinical Impression Statement: Pt is 77 y.o. male admitted for back pain, which appears to be having neurogenic changes in sensation and motor abilities. Pt received epidural for pain relief and surgical consult. Pt currently significanlty limited by pain and generalized weakness, leading to decreased I with all functional mobility and gait. pt will benefit from skilled to PT address these deficits and decrease caregiver burden at next venue of care with decreased risk of falls.  PT Recommendation/Assessment: Patient will need skilled PT in the acute care venue PT Problem List: Decreased strength;Decreased range of motion;Decreased activity tolerance;Decreased balance;Decreased mobility;Decreased coordination;Decreased cognition;Decreased knowledge of use  of DME;Decreased safety awareness;Decreased knowledge of precautions;Impaired sensation;Pain Barriers to Discharge: Other (comment) Barriers to Discharge Comments: Pt has support and accessible home, but requires more A than safe for DC home PT Therapy Diagnosis : Difficulty walking;Generalized weakness;Acute pain PT Plan PT Frequency: Min 5X/week PT Treatment/Interventions: DME instruction;Gait training;Functional mobility training;Therapeutic exercise;Neuromuscular re-education;Balance training;Cognitive remediation;Patient/family education PT Recommendation Recommendations for Other Services: OT consult Follow Up Recommendations: Skilled nursing facility Equipment Recommended: Defer to next venue PT Goals  Acute Rehab PT Goals PT Goal Formulation: With patient/family Time For Goal Achievement: 7 days Pt will Roll Supine to Right Side: with mod assist;with rail PT Goal: Rolling Supine to Right Side - Progress: Not met Pt will Roll Supine to Left Side: with min assist;with rail PT Goal: Rolling Supine to Left Side - Progress: Not met Pt will go Supine/Side to Sit: with mod assist;with HOB 0 degrees;with rail PT Goal: Supine/Side to Sit - Progress: Progressing toward goal Pt will Sit at [facility] of Bed: with supervision;3-5 min;with bilateral upper extremity support PT Goal: Sit at Edge Of Bed - Progress: Not met Pt will go Sit to Supine/Side: with mod assist;with HOB 0 degrees;with rail PT Goal: Sit to Supine/Side - Progress: Not met Pt will Transfer Sit to Stand/Stand to Sit: with min assist;with upper extremity assist (wih safe hand placement) PT Transfer Goal: Sit to Stand/Stand to Sit - Progress: Progressing toward goal Pt will Transfer Bed to Chair/Chair to Bed: with min assist;with cues (comment type and amount) (wtih safe hand placement) PT Transfer Goal: Bed to Chair/Chair  to Bed - Progress: Progressing toward goal Pt will Stand: with supervision;1 - 2 min;with bilateral upper  extremity support PT Goal: Stand - Progress: Progressing toward goal Pt will Ambulate: 16 - 50 feet;with min assist;with rolling walker PT Goal: Ambulate - Progress: Progressing toward goal  PT Evaluation Precautions/Restrictions  Precautions Precautions: Fall Restrictions Weight Bearing Restrictions: No Prior Functioning  Home Living Lives With: Spouse Receives Help From: Family Type of Home: House Home Layout: One level (1 1/5 level home, but pt only uses main level) Home Access: Stairs to enter Entrance Stairs-Rails: Can reach both Entrance Stairs-Number of Steps: 5 Bathroom Shower/Tub: Naval architect Equipment: Bedside commode/3-in-1;Walker - rolling Prior Function Level of Independence: Independent with basic ADLs;Independent with gait;Independent with transfers (Family reports pt had been "furniture walking" for months) Able to Take Stairs?: Yes Driving: No Vocation: Retired Comments: PTA, pt walked down driveway to get newspaper, but that was the extent of gait as pain increased Cognition Cognition Arousal/Alertness: Awake/alert Overall Cognitive Status: Impaired (Pt with decreased safety awareness and command following) Orientation Level: Oriented X4 Cognition - Other Comments: Pt HOH, so cog diff to assess, but even with tactile cues, pt had difficutly with RW management, attempts to hold on front bar rather than grips, etc, Sensation/Coordination Sensation Light Touch: Impaired Detail (Decreased on posterior L LE) Coordination Gross Motor Movements are Fluid and Coordinated: No (Somewhat rigid movements with mobility) Coordination and Movement Description: Impaired toe tap, L>R with pain as well Extremity Assessment RLE Assessment RLE Assessment: Exceptions to [facility] RLE Strength RLE Overall Strength Comments: Grossly 4/5 throughout LLE Assessment LLE Assessment: Exceptions to [facility] LLE AROM (degrees) Left Knee Extension 0-130:  (Approximately -10  degrees ext - pt PMH of bil TKA) LLE Strength LLE Overall Strength Comments: Grossly 4/5 throughout Mobility (including Balance) Bed Mobility Bed Mobility: Yes Rolling Right: 1: +2 Total assist Rolling Right Details (indicate cue type and reason): Attempted R rolling, but pt unable due to pain, even with A. Pt reports  Supine to Sit: 2: Max assist;HOB elevated (Comment degrees) (60degrees, Pt 75%) Supine to Sit Details (indicate cue type and reason): Pt only able to come to sitting wtih HOB elevated due to pain. Cues for hand placement, technique, to bring legs off bed and A for trunk to sit EOB.  Transfers Transfers: Yes Sit to Stand: 1: +2 Total assist;From elevated surface;With upper extremity assist;From bed Sit to Stand Details (indicate cue type and reason): Pt 60%, cues for hand placement but pt required hand-over-hand A for safety Stand to Sit: 2: Max assist;With upper extremity assist;To chair/3-in-1;With armrests Stand to Sit Details: Cues for step-by-step technique Ambulation/Gait Ambulation/Gait: Yes Ambulation/Gait Assistance: 1: +2 Total assist;Patient percentage (comment) (Pt 60%) Ambulation/Gait Assistance Details (indicate cue type and reason): Cues for posture and RW management Ambulation Distance (Feet): 6 Feet Assistive device: Rolling walker Gait Pattern: Decreased stride length;Left flexed knee in stance;Shuffle;Trunk flexed Gait velocity: NT, but decreased Stairs: No  Posture/Postural Control Posture/Postural Control: Postural limitations Postural Limitations: Forward flexed posture in sitting and standing Balance Balance Assessed: No    End of Session PT - End of Session Equipment Utilized During Treatment: Gait belt Activity Tolerance: Patient limited by pain Patient left: in chair;with call bell in reach;with family/visitor present (Advised pt to be up in chair as long as tolerable) Nurse Communication: Mobility status for transfers;Mobility status for  ambulation General Behavior During Session: [facility] for tasks performed Cognition: Impaired  Barron Alvine [place], [place] [phone] 09/01/2011, 3:28 PM

## 2011-09-01 NOTE — Consult Note (Signed)
Reason for Consult:can't walk Referring Physician: [name] is an 77 y.o. male.  HPI: can't walk more than 10 feet without having to stop for pain.  Patient denies symptoms are that severe however on recent trip to visit son in [place] family had to use wheelbarrow to help patient into house. Mri demonstrates lumbar spondylosis with multilevel stenosis.  Past Medical History  Diagnosis Date  . Hypertension   . Hyperlipidemia   . Carotid artery occlusion   . AAA (abdominal aortic aneurysm)   . Valvular heart disease   . Hyperlipidemia   . Cancer     "skin cancer removed from top of head"  . Arthritis     "in his spine"  . Refusal of blood transfusions as patient is Jehovah's Witness 08/31/11  . PTSD (post-traumatic stress disorder)   . Depression     "terrible; from the PTSD"  . Anxiety   . Nightmares   . Spinal stenosis     Past Surgical History  Procedure Date  . Wrist surgery 2003    left; S/P fracture; "put a plate in it"  . Abdominal aortic aneurysm repair 2002  . Carotid endarterectomy 2004    left  . Joint replacement   . Total knee arthroplasty 2000's    bilaterally  . Cataract extraction w/ intraocular lens  implant, bilateral ~ 2010    Family History  Problem Relation Age of Onset  . Heart disease Father   . Stroke Mother   . Arthritis Mother   . Hypertension Mother   . Diabetes Maternal Aunt     Social History:  reports that he has quit smoking. His smoking use included Cigarettes. He has never used smokeless tobacco. He reports that he drinks about 7.2 ounces of alcohol per week. He reports that he does not use illicit drugs.  Allergies:  Allergies  Allergen Reactions  . Morphine And Related     Causes confusion    Medications: I have reviewed the patient's current medications.  Results for orders placed during the hospital encounter of 08/31/11 (from the past 48 hour(s))  COMPREHENSIVE METABOLIC PANEL     Status: Abnormal   Collection Time   08/31/11  8:56 PM      Component Value Range Comment   Sodium 144  135 - 145 (mEq/L)    Potassium 3.7  3.5 - 5.1 (mEq/L)    Chloride 109  96 - 112 (mEq/L)    CO2 27  19 - 32 (mEq/L)    Glucose, Bld 127 (*) 70 - 99 (mg/dL)    BUN 24 (*) 6 - 23 (mg/dL)    Creatinine, Ser 1.61  0.50 - 1.35 (mg/dL)    Calcium 9.3  8.4 - 10.5 (mg/dL)    Total Protein 6.3  6.0 - 8.3 (g/dL)    Albumin 3.5  3.5 - 5.2 (g/dL)    AST 27  0 - 37 (U/L)    ALT 20  0 - 53 (U/L)    Alkaline Phosphatase 70  39 - 117 (U/L)    Total Bilirubin 0.3  0.3 - 1.2 (mg/dL)    GFR calc non Af Amer 47 (*) >90 (mL/min)    GFR calc Af Amer 54 (*) >90 (mL/min)   CBC     Status: Normal   Collection Time   08/31/11  8:56 PM      Component Value Range Comment   WBC 8.0  4.0 - 10.5 (K/uL)    RBC  4.56  4.22 - 5.81 (MIL/uL)    Hemoglobin 13.4  13.0 - 17.0 (g/dL)    HCT 16.1  09.6 - 04.5 (%)    MCV 89.7  78.0 - 100.0 (fL)    MCH 29.4  26.0 - 34.0 (pg)    MCHC 32.8  30.0 - 36.0 (g/dL)    RDW 40.9  81.1 - 91.4 (%)    Platelets 162  150 - 400 (K/uL)   TSH     Status: Normal   Collection Time   08/31/11  8:56 PM      Component Value Range Comment   TSH 3.563  0.350 - 4.500 (uIU/mL)   MAGNESIUM     Status: Normal   Collection Time   08/31/11  8:56 PM      Component Value Range Comment   Magnesium 2.4  1.5 - 2.5 (mg/dL)     No results found.  Review of Systems  Constitutional: Negative.   HENT: Negative.   Eyes: Negative.   Respiratory: Negative.   Cardiovascular: Negative.   Gastrointestinal: Negative.   Genitourinary: Negative.   Musculoskeletal: Positive for back pain.  Skin: Negative.   Neurological:       Cant ambulate more than 10 feet because of pain  Endo/Heme/Allergies: Negative.   Psychiatric/Behavioral: Negative.    Blood pressure 196/90, pulse 63, temperature 98.1 F (36.7 C), temperature source Oral, resp. rate 18, SpO2 95.00%. Physical Exam Alert hard of hearing.moves lower  extremities with 4/5 strength in major groups including IP Quad TA Gast.  Assessment/Plan: Lumbar spondylosis with stenosis. Radiculopathy with neurogenic claudication. Lumbar epidural steroid injection.  [name] [name] J 09/01/2011, 2:33 AM

## 2011-09-01 NOTE — Procedures (Signed)
Right L3/4 Epi. 120 mg Depot Medrol No complications. See Rad Dictation for full note.

## 2011-09-02 DIAGNOSIS — I359 Nonrheumatic aortic valve disorder, unspecified: Secondary | ICD-10-CM

## 2011-09-02 LAB — BASIC METABOLIC PANEL WITH GFR
BUN: 20 mg/dL (ref 6–23)
CO2: 23 meq/L (ref 19–32)
Calcium: 9.4 mg/dL (ref 8.4–10.5)
Chloride: 110 meq/L (ref 96–112)
Creatinine, Ser: 1.02 mg/dL (ref 0.50–1.35)
GFR calc Af Amer: 75 mL/min — ABNORMAL LOW
GFR calc non Af Amer: 64 mL/min — ABNORMAL LOW
Glucose, Bld: 108 mg/dL — ABNORMAL HIGH (ref 70–99)
Potassium: 3.9 meq/L (ref 3.5–5.1)
Sodium: 143 meq/L (ref 135–145)

## 2011-09-02 LAB — CBC
Hemoglobin: 13 g/dL (ref 13.0–17.0)
Platelets: 152 10*3/uL (ref 150–400)
RBC: 4.53 MIL/uL (ref 4.22–5.81)

## 2011-09-02 MED ORDER — HALOPERIDOL LACTATE 5 MG/ML IJ SOLN
1.0000 mg | Freq: Two times a day (BID) | INTRAMUSCULAR | Status: DC | PRN
  Administered 2011-09-03 (×2): 1 mg via INTRAVENOUS
  Filled 2011-09-02 (×2): qty 1

## 2011-09-02 MED FILL — Methylprednisolone Acetate PF Inj Susp 80 MG/ML: INTRAMUSCULAR | Qty: 1 | Status: AC

## 2011-09-02 MED FILL — Methylprednisolone Acetate PF Inj Susp 40 MG/ML: INTRAMUSCULAR | Qty: 1 | Status: AC

## 2011-09-02 NOTE — Progress Notes (Signed)
Patient ID: [name] [name], male   DOB: June 12, 1923, 77 y.o.   MRN: [id number]  Subjective: [name] reports agitation overnight.  Objective:  Vital signs in last 24 hours:  Filed Vitals:   09/01/11 2300 09/02/11 0300 09/02/11 0500 09/02/11 1033  BP: 193/72 166/72 171/75 140/60  Pulse:  72 64 72  Temp:  97.8 F (36.6 C) 97.4 F (36.3 C) 97.7 F (36.5 C)  TempSrc:  Oral Oral Oral  Resp:  18 17 20   Height:      Weight:      SpO2:  95% 94% 94%    Intake/Output from previous day:   Intake/Output Summary (Last 24 hours) at 09/02/11 1105 Last data filed at 09/01/11 1300  Gross per 24 hour  Intake    360 ml  Output      0 ml  Net    360 ml    Physical Exam: General: Alert, awake, but appeared agitated, does not follow commands HEENT: No bruits, no goiter. Moist mucous membranes, no scleral icterus, no conjunctival pallor. Heart: Regular rate and rhythm, without murmurs, rubs, gallops. Lungs: Clear to auscultation bilaterally. No wheezing, no rhonchi, no rales.  Abdomen: Soft, nontender, nondistended, positive bowel sounds. Extremities: No clubbing cyanosis or edema,  positive pedal pulses. Neuro: Grossly intact, nonfocal.    Lab Results:  Basic Metabolic Panel:    Component Value Date/Time   NA 143 09/02/2011 0610   K 3.9 09/02/2011 0610   CL 110 09/02/2011 0610   CO2 23 09/02/2011 0610   BUN 20 09/02/2011 0610   CREATININE 1.02 09/02/2011 0610   GLUCOSE 108* 09/02/2011 0610   CALCIUM 9.4 09/02/2011 0610   CBC:    Component Value Date/Time   WBC 8.8 09/02/2011 0610   HGB 13.0 09/02/2011 0610   HCT 40.5 09/02/2011 0610   PLT 152 09/02/2011 0610   MCV 89.4 09/02/2011 0610   NEUTROABS 6.5 05/10/2009 1132   LYMPHSABS 0.7 05/10/2009 1132   MONOABS 0.5 05/10/2009 1132   EOSABS 0.2 05/10/2009 1132   BASOSABS 0.0 05/10/2009 1132      Lab 09/02/11 0610 09/01/11 0735 08/31/11 2056  WBC 8.8 7.6 8.0  HGB 13.0 12.9* 13.4  HCT 40.5 40.1 40.9  PLT 152 152 162  MCV  89.4 89.7 89.7  MCH 28.7 28.9 29.4  MCHC 32.1 32.2 32.8  RDW 15.1 15.1 15.0  LYMPHSABS -- -- --  MONOABS -- -- --  EOSABS -- -- --  BASOSABS -- -- --  BANDABS -- -- --    Lab 09/02/11 0610 09/01/11 0735 08/31/11 2056  NA 143 143 144  K 3.9 3.5 3.7  CL 110 110 109  CO2 23 24 27   GLUCOSE 108* 87 127*  BUN 20 19 24*  CREATININE 1.02 1.16 1.33  CALCIUM 9.4 8.9 9.3  MG -- -- 2.4   No results found for this basename: INR:5,PROTIME:5 in the last 168 hours Cardiac markers: No results found for this basename: CK:3,CKMB:3,TROPONINI:3,MYOGLOBIN:3 in the last 168 hours No results found for this basename: POCBNP:3 in the last 168 hours No results found for this or any previous visit (from the past 240 hour(s)).  Studies/Results: Ir Epidurography  09/01/2011  IMPRESSIONS:  Technically successful first interlaminar epidural steroid injection on the right at L3-4.     Medications: Scheduled Meds:   . aspirin EC  81 mg Oral Daily  . buPROPion  100 mg Oral Daily  . escitalopram  10 mg Oral Daily  . folic  acid  1 mg Oral Daily  . multivitamins ther. w/minerals  1 tablet Oral Daily  . rosuvastatin  20 mg Oral QHS  . thiamine  100 mg Oral Daily   Or  . thiamine  100 mg Intravenous Daily   Continuous Infusions:   . sodium chloride 75 mL/hr at 09/01/11 2321   PRN Meds:.acetaminophen, acetaminophen, hydrALAZINE, HYDROmorphone, LORazepam, LORazepam, ondansetron (ZOFRAN) IV, ondansetron, oxyCODONE, triamcinolone, DISCONTD: hydrALAZINE  Assessment/Plan:  Principal Problem:  *Low back pain - currently well controlled but given pt's agitation will change PO medication  For pain control PRN and start haldol for agitation, follow up on PT evaluation  Active Problems:  Hypertension - not well controlled, start norvasc 1-0 mg QD   Hyperlipidemia - continue statin     LOS: 2 days   MAGICK-Snyder Colavito 09/02/2011, 11:05 AM

## 2011-09-02 NOTE — Progress Notes (Signed)
Subjective: Patient reports legs and back feels better  Objective: Vital signs in last 24 hours: Temp:  [97.4 F (36.3 C)-98.6 F (37 C)] 98 F (36.7 C) (11/28 1750) Pulse Rate:  [64-72] 72  (11/28 1832) Resp:  [17-20] 20  (11/28 1750) BP: (140-193)/(60-81) 163/65 mmHg (11/28 1832) SpO2:  [92 %-97 %] 97 % (11/28 1750)  Intake/Output from previous day: 11/27 0701 - 11/28 0700 In: 360 [P.O.:360] Out: -  Intake/Output this shift:    motor function at least 4/ 5 in major lower exrtremity muscle groups  Lab Results:  [facility] 09/02/11 0610 09/01/11 0735  WBC 8.8 7.6  HGB 13.0 12.9*  HCT 40.5 40.1  PLT 152 152   BMET  Basename 09/02/11 0610 09/01/11 0735  NA 143 143  K 3.9 3.5  CL 110 110  CO2 23 24  GLUCOSE 108* 87  BUN 20 19  CREATININE 1.02 1.16  CALCIUM 9.4 8.9    Studies/Results: Ir Epidurography  09/01/2011  *RADIOLOGY REPORT*  CLINICAL DATA:  Lumbosacral spondylosis without myelopathy. Displacement of the L3-4 and L4-5 lumbar disc.  Dr. Danielle Dess requests injection at L3-4.  Right lower extremity radiculitis.  LUMBAR EPIDURAL INJECTION: An interlaminar approach was performed on the right at L3-4.  The overlying skin was cleansed and anesthetized.  A 20 gauge spinal needle was advanced using loss-of-resistance technique.  Injection of 2cc of Omnipaque 180 confirmed epidural placement.  There was no evidence for intravascular or intrathecal spread of contrast.  I then injected 120 mg of Depo-Medrol and 3ml of 1% lidocaine.  The patient tolerated the procedure without evidence for complication. The patient  was observed for 20 minutes prior to discharge in stable neurologic condition.  FLUORO TIME:  0.4 minutes  IMPRESSIONS:  Technically successful first interlaminar epidural steroid injection on the right at L3-4.  Original Report Authenticated By: Jamesetta Orleans. MATTERN, M.D.    Assessment/Plan: Stable, PT/OT  LOS: 2 days     Benedicto Capozzi J 09/02/2011, 7:38  PM

## 2011-09-02 NOTE — Progress Notes (Signed)
CSW completed full assessment and placed in chart with FL2 for MD signature. CSW faxed out to [facility]. CSW will continue to follow. Unk Lightning [phone]

## 2011-09-02 NOTE — Progress Notes (Signed)
  Echocardiogram 2D Echocardiogram has been performed.  [name] [name] 09/02/2011, 1:49 PM

## 2011-09-02 NOTE — Progress Notes (Signed)
Physical Therapy Treatment Patient Details Name: [name] [name] MRN: [id number] DOB: 08/05/1923 Today's Date: 09/02/2011  PT Assessment/Plan  PT - Assessment/Plan Comments on Treatment Session: 0/10 pain with treatment. PT Plan: Discharge plan remains appropriate;Frequency needs to be updated PT Frequency: Min 3X/week Follow Up Recommendations: Skilled nursing facility Equipment Recommended: Defer to next venue PT Goals  Acute Rehab PT Goals PT Goal Formulation: With patient/family Time For Goal Achievement: 7 days Pt will Roll Supine to Right Side: with mod assist;with rail PT Goal: Rolling Supine to Right Side - Progress: Progressing toward goal Pt will Roll Supine to Left Side: with min assist;with rail PT Goal: Rolling Supine to Left Side - Progress: Progressing toward goal Pt will go Supine/Side to Sit: with mod assist;with HOB 0 degrees;with rail PT Goal: Supine/Side to Sit - Progress: Met Pt will Sit at Edge of Bed: with supervision;3-5 min;with bilateral upper extremity support PT Goal: Sit at Edge Of Bed - Progress: Progressing toward goal Pt will go Sit to Supine/Side: with mod assist;with HOB 0 degrees;with rail PT Goal: Sit to Supine/Side - Progress: Progressing toward goal Pt will Transfer Sit to Stand/Stand to Sit: with min assist;with upper extremity assist PT Transfer Goal: Sit to Stand/Stand to Sit - Progress: Met Pt will Transfer Bed to Chair/Chair to Bed: with min assist;with cues (comment type and amount) PT Transfer Goal: Bed to Chair/Chair to Bed - Progress: Progressing toward goal Pt will Stand: with supervision;1 - 2 min;with bilateral upper extremity support PT Goal: Stand - Progress: Progressing toward goal Pt will Ambulate: 16 - 50 feet;with min assist;with rolling walker PT Goal: Ambulate - Progress: Revised (modified due to lack of progress/goal met) Additional Goals Additional Goal #1: Pt will ambulate 150 feet with RW and supervision. PT Goal:  Additional Goal #1 - Progress: Progressing toward goal  PT Treatment Precautions/Restrictions  Precautions Precautions: Fall Required Braces or Orthoses: No Restrictions Weight Bearing Restrictions: No Pain 0/10 pain with treatment. Mobility (including Balance) Bed Mobility Bed Mobility: Yes Rolling Right: Not tested (comment) (Did not perform) Supine to Sit: 4: Min assist;HOB flat;With rails Supine to Sit Details (indicate cue type and reason): Assist to trunk to translate anterior over BOS.  Cues for sequence. Transfers Transfers: Yes Sit to Stand: 4: Min assist;From bed;With upper extremity assist Sit to Stand Details (indicate cue type and reason): Assist for balance with facilitation to trunk for anterior translation.  Cues for hand placement. Stand to Sit: 4: Min assist;With upper extremity assist;To chair/3-in-1 Stand to Sit Details: Assist for eccentric descent to chair for control.  Cues for safest hand placement. Ambulation/Gait Ambulation/Gait: Yes Ambulation/Gait Assistance: 4: Min assist Ambulation/Gait Assistance Details (indicate cue type and reason): Assist for balance and safety inside RW with cues to extend trunk and step inside RW.  Right LE externally rotated with decreased step length. Ambulation Distance (Feet): 80 Feet Assistive device: Rolling walker Gait Pattern: Decreased step length - right;Shuffle;Trunk flexed;Trunk rotated posteriorly on right (External rotation of right foot.) Stairs: No Wheelchair Mobility Wheelchair Mobility: No  Posture/Postural Control Posture/Postural Control: Postural limitations Postural Limitations: Forward flexed posture in sitting and standing Balance Balance Assessed: No Exercise    End of Session PT - End of Session Equipment Utilized During Treatment: Gait belt Activity Tolerance: Patient tolerated treatment well Patient left: in chair;with call bell in reach Nurse Communication: Mobility status for  transfers;Mobility status for ambulation General Behavior During Session: [facility] for tasks performed Cognition: Impaired Cognitive Impairment: Slow to process information with  some confusion during conversation (possibly due to [facility]).  Cephus Shelling 09/02/2011, 11:37 AM  09/02/2011 Cephus Shelling, PT, DPT [phone]

## 2011-09-03 MED ORDER — OXYCODONE HCL 5 MG PO TABS: 5.0000 mg | ORAL_TABLET | ORAL | Status: DC | PRN

## 2011-09-03 NOTE — Progress Notes (Signed)
CSW met with patient and wife at bedside. Patient has decided he wants SNF. CSW gave family bed offers. Family wishes for CSW to check with [facility] since it is close to their home. CSW called SNF and left a message. Family is comfortable with [facility] if [facility] is not available. CSW will follow up with offers. CSW sent off pasarr request with 30 day note today. CSW will continue to follow. Unk [place] [phone]

## 2011-09-03 NOTE — Progress Notes (Signed)
[facility] called CSW and offered a bed for patient. CSW called patient and wife who accepted bed offer. Wife agreed to contact SNF today to complete paperwork for anticipated dc tomorrow. Unk Lightning [phone]

## 2011-09-03 NOTE — Progress Notes (Signed)
CSW received 30 day pasarr for patient. Unk Lightning [phone]

## 2011-09-03 NOTE — Discharge Summary (Signed)
Patient ID: [name] [name] MRN: [id number] DOB/AGE: 01-07-1923 77 y.o.  Admit date: 08/31/2011 Discharge date: 09/03/2011  Primary Care Physician:  Ginette Otto, MD, MD  Discharge Diagnoses:    Present on Admission:  .Low back pain .Hypertension .Hyperlipidemia  Principal Problem:  *Low back pain Active Problems:  Hypertension  Hyperlipidemia   Current Discharge Medication List    START taking these medications   Details  oxyCODONE (OXY IR/ROXICODONE) 5 MG immediate release tablet Take 1 tablet (5 mg total) by mouth every 4 (four) hours as needed. Qty: 45 tablet, Refills: 0      CONTINUE these medications which have NOT CHANGED   Details  aspirin 81 MG tablet Take 81 mg by mouth daily.      buPROPion (WELLBUTRIN) 100 MG tablet Take 100 mg by mouth daily.      Coenzyme Q10 (CO Q 10) 100 MG CAPS Take 1 capsule by mouth daily.      CRESTOR 20 MG tablet Take 20 mg by mouth Daily.    escitalopram (LEXAPRO) 10 MG tablet Take 10 mg by mouth daily.      Multiple Vitamins-Minerals (MULTIVITAMINS THER. W/MINERALS) TABS Take 1 tablet by mouth daily.      OVER THE COUNTER MEDICATION Take 1 tablet by mouth daily. Over the counter constipation supplement     SAW PALMETTO, SERENOA REPENS, PO Take 3 tablets by mouth daily.      sodium chloride (OCEAN) 0.65 % nasal spray Place 2 sprays into the nose daily as needed. For dryness     triamcinolone (KENALOG) 0.025 % cream Apply 1 application topically daily as needed. For dryness        Disposition and Follow-up: Pt will need follow up with primary care physician in 2-4 weeks. He will need to have CBC and BMP repeated to ensure that blood work is stable and at patient's baseline. Please also follow up on back pain control. Pt has received epidural steroid injection by Dr. Danielle Dess and he has reported an improvement in pain.   Consults:  Dr. Danielle Dess (neurosurgery)  Significant Diagnostic Studies:   Ir  Epidurography 09/01/2011   IMPRESSIONS:  Technically successful first interlaminar epidural steroid injection on the right at L3-4.     Brief H and P: 77 year old male with history of hypertension hyperlipidemia carotid artery occlusion status post left-sided carotid endarterectomy, history of abdominal at aneurysm repair has been experiencing low back pain worsening over the last 3-4 days. [name] states that he has been having low back pain for long time and was told he had degenerative discs in his low back. He has had an MRI of his lumbosacral spine on November 13 the results of which I am not able to access. [name] states that the results are showing some impingement of nerve root. Over the last 3-4 days as patient's pain has gotten worse they did call on call physician who called in hydrocodone. Hydrocodone helped initially but patient's pain became worse that they had to call EMS to take the patient to the PCPs office today. Since he is having intractable pain he has been directly admitted to [facility] for further workup.  The pain is usually low back radiates to the right leg. He has not had any incontinence of urine or bowel. Patient denies any fever chills, chest pain or shortness of breath, denies any nausea vomiting or bowel pain dysuria discharges or diarrhea. Patient has been having recurrent syncopal episodes. The last one was 4  weeks ago. When they checked his orthostatics he was found to be severely orthostatic with blood pressure systolic dropping from 120 to the 70s. His PCP Dr. Pete Glatter discontinued his antihypertensives, after which he has not had any further syncopal episodes. On admission his blood pressure was found to be high with systolic around 180s.   Physical Exam on Discharge:  Filed Vitals:   09/02/11 1750 09/02/11 1832 09/03/11 0100 09/03/11 0712  BP: 189/73 163/65 174/72 194/81  Pulse: 68 72 74 68  Temp: 98 F (36.7 C)  98 F (36.7 C) 97.4 F (36.3  C)  TempSrc: Oral  Oral Oral  Resp: 20  19 20   Height:      Weight:      SpO2: 97%  93% 93%     Intake/Output Summary (Last 24 hours) at 09/03/11 0714 Last data filed at 09/02/11 1700  Gross per 24 hour  Intake 3798.75 ml  Output      0 ml  Net 3798.75 ml    General: Alert, awake, oriented x3, in no acute distress. HEENT: No bruits, no goiter. Heart: Regular rate and rhythm, without murmurs, rubs, gallops. Lungs: Clear to auscultation bilaterally. Abdomen: Soft, nontender, nondistended, positive bowel sounds. Extremities: No clubbing cyanosis or edema with positive pedal pulses. Neuro: Grossly intact, nonfocal.  CBC:    Component Value Date/Time   WBC 8.8 09/02/2011 0610   HGB 13.0 09/02/2011 0610   HCT 40.5 09/02/2011 0610   PLT 152 09/02/2011 0610   MCV 89.4 09/02/2011 0610   NEUTROABS 6.5 05/10/2009 1132   LYMPHSABS 0.7 05/10/2009 1132   MONOABS 0.5 05/10/2009 1132   EOSABS 0.2 05/10/2009 1132   BASOSABS 0.0 05/10/2009 1132    Basic Metabolic Panel:    Component Value Date/Time   NA 143 09/02/2011 0610   K 3.9 09/02/2011 0610   CL 110 09/02/2011 0610   CO2 23 09/02/2011 0610   BUN 20 09/02/2011 0610   CREATININE 1.02 09/02/2011 0610   GLUCOSE 108* 09/02/2011 0610   CALCIUM 9.4 09/02/2011 0610    Hospital Course:   Principal Problem:  *Low back pain - currently well controlled, no agitation over night, continue PO pain medication PRN.  Active Problems:  Hypertension - not well controlled, start norvasc 1-0 mg QD   Hyperlipidemia - continue statin  Disposition - SNF placement today if bed available   Time spent on Discharge: Over 30 minutes  Signed: MAGICK-Jalen Oberry 09/03/2011, 7:14 AM

## 2011-09-04 LAB — GLUCOSE, CAPILLARY: Glucose-Capillary: 98 mg/dL (ref 70–99)

## 2011-09-04 NOTE — Progress Notes (Signed)
Patient will dc to SNF on 09/05/11. [name] will transport patient to SNF. CSW faxed dc summary and medication list to SNF today. SNF is agreeable to patient admitting on 09/05/11 at 1000. CSW left sticky note on Epic and informed MD. CSW placed chart copy in [facility]-a[facility]roo and reminded patient and wife to take packet with them. CSW is signing off. Unk Lightning [phone]

## 2011-09-04 NOTE — Progress Notes (Signed)
Patient ID: [name] [name], male   DOB: 06/27/1923, 77 y.o.   MRN: [id number] Patient and wife report improvement in pain after epidural steroid injection. Continue to mobilize. May require repeat injection in future. Will f/u as out patient.

## 2011-09-04 NOTE — Progress Notes (Signed)
Patient ID: [name] [name], male   DOB: [birth year], 77 y.o.   MRN: [id number]  Subjective: No events overnight. Patient denies chest pain, shortness of breath, abdominal pain. Had bowel movement and reports ambulating.  Objective:  Vital signs in last 24 hours:  Filed Vitals:   09/03/11 1038 09/03/11 1800 09/03/11 2200 09/04/11 0600  BP: 183/77 162/63 137/57 163/71  Pulse: 73 68 61 65  Temp:   97.4 F (36.3 C) 97.6 F (36.4 C)  TempSrc:      Resp: 20 20 18 18   Height:      Weight:      SpO2:   95% 95%    Intake/Output from previous day:   Intake/Output Summary (Last 24 hours) at 09/04/11 1354 Last data filed at 09/04/11 1241  Gross per 24 hour  Intake   1660 ml  Output      0 ml  Net   1660 ml    Physical Exam: General: Alert, awake, oriented x3, in no acute distress. HEENT: No bruits, no goiter. Moist mucous membranes, no scleral icterus, no conjunctival pallor. Heart: Regular rate and rhythm, without murmurs, rubs, gallops. Lungs: Clear to auscultation bilaterally. No wheezing, no rhonchi, no rales.  Abdomen: Soft, nontender, nondistended, positive bowel sounds. Extremities: No clubbing cyanosis or edema,  positive pedal pulses. Neuro: Grossly intact, nonfocal.  Lab Results:  Basic Metabolic Panel:    Component Value Date/Time   NA 143 09/02/2011 0610   K 3.9 09/02/2011 0610   CL 110 09/02/2011 0610   CO2 23 09/02/2011 0610   BUN 20 09/02/2011 0610   CREATININE 1.02 09/02/2011 0610   GLUCOSE 108* 09/02/2011 0610   CALCIUM 9.4 09/02/2011 0610   CBC:    Component Value Date/Time   WBC 8.8 09/02/2011 0610   HGB 13.0 09/02/2011 0610   HCT 40.5 09/02/2011 0610   PLT 152 09/02/2011 0610   MCV 89.4 09/02/2011 0610   NEUTROABS 6.5 05/10/2009 1132   LYMPHSABS 0.7 05/10/2009 1132   MONOABS 0.5 05/10/2009 1132   EOSABS 0.2 05/10/2009 1132   BASOSABS 0.0 05/10/2009 1132      Lab 09/02/11 0610 09/01/11 0735 08/31/11 2056  WBC 8.8 7.6 8.0  HGB 13.0 12.9* 13.4  HCT  40.5 40.1 40.9  PLT 152 152 162  MCV 89.4 89.7 89.7  MCH 28.7 28.9 29.4  MCHC 32.1 32.2 32.8  RDW 15.1 15.1 15.0  LYMPHSABS -- -- --  MONOABS -- -- --  EOSABS -- -- --  BASOSABS -- -- --  BANDABS -- -- --    Lab 09/02/11 0610 09/01/11 0735 08/31/11 2056  NA 143 143 144  K 3.9 3.5 3.7  CL 110 110 109  CO2 23 24 27   GLUCOSE 108* 87 127*  BUN 20 19 24*  CREATININE 1.02 1.16 1.33  CALCIUM 9.4 8.9 9.3  MG -- -- 2.4   No results found for this basename: INR:5,PROTIME:5 in the last 168 hours Cardiac markers: No results found for this basename: CK:3,CKMB:3,TROPONINI:3,MYOGLOBIN:3 in the last 168 hours No results found for this basename: POCBNP:3 in the last 168 hours No results found for this or any previous visit (from the past 240 hour(s)).  Studies/Results: No results found.  Medications: Scheduled Meds:   . aspirin EC  81 mg Oral Daily  . buPROPion  100 mg Oral Daily  . escitalopram  10 mg Oral Daily  . folic acid  1 mg Oral Daily  . multivitamins ther. w/minerals  1 tablet  Oral Daily  . rosuvastatin  20 mg Oral QHS  . thiamine  100 mg Oral Daily  . DISCONTD: thiamine  100 mg Intravenous Daily   Continuous Infusions:   . sodium chloride 75 mL/hr at 09/01/11 2321   PRN Meds:.acetaminophen, acetaminophen, haloperidol lactate, hydrALAZINE, LORazepam, LORazepam, ondansetron (ZOFRAN) IV, ondansetron, oxyCODONE, triamcinolone  Assessment/Plan:  Principal Problem:  *Low back pain - clinically improved, continue physical therapy at SNF when bed available - continue providing pain control  Active Problems:  Hypertension - controlled - continue monitoring and defer to PCP to make decision to initiate anti hypertensives   Hyperlipidemia - continue statin   Disposition  - plan d/c in AM    LOS: 4 days   MAGICK-Howard Bunte 09/04/2011, 1:54 PM

## 2011-09-05 ENCOUNTER — Inpatient Hospital Stay (HOSPITAL_COMMUNITY): Payer: Federal, State, Local not specified - PPO

## 2011-09-05 MED ORDER — CEPHALEXIN 500 MG PO CAPS
500.0000 mg | ORAL_CAPSULE | Freq: Three times a day (TID) | ORAL | Status: DC
  Administered 2011-09-05 – 2011-09-07 (×5): 500 mg via ORAL
  Filled 2011-09-05 (×10): qty 1

## 2011-09-05 MED ORDER — CEPHALEXIN 500 MG PO CAPS: 500.0000 mg | ORAL_CAPSULE | Freq: Three times a day (TID) | ORAL | Status: AC

## 2011-09-05 MED ORDER — LIDOCAINE HCL (PF) 1 % IJ SOLN
INTRAMUSCULAR | Status: AC
  Filled 2011-09-05: qty 5

## 2011-09-05 NOTE — Consult Note (Signed)
Reason for Consult:laceration of L 3-4 digits, dislocation of L 3rd digit Referring Physician: [name] is an 77 y.o. male. Who fell this am on outstretched L hand, c/o pain, difficulty with movement of L 3-4 digits, laceration to L 3-4 digits, no LOC. HPI: as above  Past Medical History  Diagnosis Date  . Hypertension   . Hyperlipidemia   . Carotid artery occlusion   . AAA (abdominal aortic aneurysm)   . Valvular heart disease   . Hyperlipidemia   . Cancer     "skin cancer removed from top of head"  . Arthritis     "in his spine"  . Refusal of blood transfusions as patient is Jehovah's Witness 08/31/11  . PTSD (post-traumatic stress disorder)   . Depression     "terrible; from the PTSD"  . Anxiety   . Nightmares   . Spinal stenosis     Past Surgical History  Procedure Date  . Wrist surgery 2003    left; S/P fracture; "put a plate in it"  . Abdominal aortic aneurysm repair 2002  . Carotid endarterectomy 2004    left  . Joint replacement   . Total knee arthroplasty 2000's    bilaterally  . Cataract extraction w/ intraocular lens  implant, bilateral ~ 2010    Family History  Problem Relation Age of Onset  . Heart disease Father   . Stroke Mother   . Arthritis Mother   . Hypertension Mother   . Diabetes Maternal Aunt     Social History:  reports that he has quit smoking. His smoking use included Cigarettes. He has never used smokeless tobacco. He reports that he drinks about 7.2 ounces of alcohol per week. He reports that he does not use illicit drugs.  Allergies:  Allergies  Allergen Reactions  . Morphine And Related     Causes confusion    Medications: I have reviewed the patient's current medications.  Results for orders placed during the hospital encounter of 08/31/11 (from the past 48 hour(s))  GLUCOSE, CAPILLARY     Status: Normal   Collection Time   09/04/11  6:51 AM      Component Value Range Comment   Glucose-Capillary 98  70 -  99 (mg/dL)   GLUCOSE, CAPILLARY     Status: Normal   Collection Time   09/05/11  6:52 AM      Component Value Range Comment   Glucose-Capillary 87  70 - 99 (mg/dL)     Dg Hand 2 View Left  09/05/2011  *RADIOLOGY REPORT*  Clinical Data:  Fall with left hand injury.  LEFT HAND - 2 VIEW  Comparison: None.  Findings: Two views of the left hand were obtained.  There is a dislocation of the third finger PIP joint.  The finger is dislocated towards the dorsal aspect of the hand. Joint space narrowing of the PIP joints in the hand.  Irregularity of the thumb distal phalanx but no evidence for an acute fracture in this area. Plate and screw fixation of the distal radius.  There is remodeling of the ulnar styloid suggesting an old fracture.  IMPRESSION: Dislocation of the third finger PIP joint.  Irregularity and lucency involving the thumb distal phalanx probably related to chronic changes or old injury.  Recommend clinical correlation in this area.  Old trauma to the left wrist.  Original Report Authenticated By: Richarda Overlie, M.D.    Review of Systems  Constitutional: Negative for fever and chills.  Musculoskeletal: Positive for joint pain and falls.   Blood pressure 172/72, pulse 74, temperature 98.4 F (36.9 C), temperature source Oral, resp. rate 20, height 5\' 8"  (1.727 m), weight 85.276 kg (188 lb), SpO2 94.00%. Physical Exam  Constitutional: He is oriented to person, place, and time. He appears well-developed and well-nourished.  HENT:  Head: Normocephalic and atraumatic.  Eyes: Conjunctivae are normal.  Neck: Neck supple.  Cardiovascular: Normal rate.   Respiratory: He has wheezes.  GI: Soft.  Musculoskeletal: He exhibits edema and tenderness.       Arms: Neurological: He is alert and oriented to person, place, and time.  Skin: Skin is warm and dry.    Assessment/Plan: Lacerations of Left 3 and 4th digits, disolcation of pipj L 3rd finger  Re-location discussed with patient, traction  applied and gentle flexion of pipj - re-location of pipj, good rom after redection, no re-dislocation, stable in arc of motion, n/vintact after procedurre; wounds dressed with zeroform gauze, and sterile gauze; dressing changes and oral Keflex ordered.  Pt should f/u in 2 wks, prn signs of infection  [name] [name] 09/05/2011, 3:26 PM

## 2011-09-05 NOTE — Discharge Summary (Signed)
Patient ID: [name] [name] MRN: [id number] DOB/AGE: 1923-02-11 77 y.o.  Admit date: 08/31/2011 Discharge date: 09/05/2011  Primary Care Physician:  [name] Otto, MD, MD  Discharge Diagnoses:    Present on Admission:  .Low back pain .Hypertension .Hyperlipidemia  Principal Problem:  *Low back pain Active Problems:  Hypertension  Hyperlipidemia   Current Discharge Medication List    START taking these medications   Details  oxyCODONE (OXY IR/ROXICODONE) 5 MG immediate release tablet Take 1 tablet (5 mg total) by mouth every 4 (four) hours as needed. Qty: 45 tablet, Refills: 0      CONTINUE these medications which have NOT CHANGED   Details  aspirin 81 MG tablet Take 81 mg by mouth daily.      buPROPion (WELLBUTRIN) 100 MG tablet Take 100 mg by mouth daily.      Coenzyme Q10 (CO Q 10) 100 MG CAPS Take 1 capsule by mouth daily.      CRESTOR 20 MG tablet Take 20 mg by mouth Daily.    escitalopram (LEXAPRO) 10 MG tablet Take 10 mg by mouth daily.      Multiple Vitamins-Minerals (MULTIVITAMINS THER. W/MINERALS) TABS Take 1 tablet by mouth daily.      OVER THE COUNTER MEDICATION Take 1 tablet by mouth daily. Over the counter constipation supplement     SAW PALMETTO, SERENOA REPENS, PO Take 3 tablets by mouth daily.      sodium chloride (OCEAN) 0.65 % nasal spray Place 2 sprays into the nose daily as needed. For dryness     triamcinolone (KENALOG) 0.025 % cream Apply 1 application topically daily as needed. For dryness        Disposition and Follow-up: Pt will need follow up with primary care physician in 2-4 weeks. He will need to have CBC and BMP repeated to ensure that blood work is stable and at patient's baseline. Please also follow up on back pain control. Pt has received epidural steroid injection by Dr. Danielle [name] and he has reported an improvement in pain. In addition, pt's BP medications were held during the hospitalization as some were consider to  contribute to his weakness and syncopal episodes. His BP needs to be reevaluated in an outpatient setting to decide on antihypertensive treatment since pt will most likely need at least one antihypertensive medication for BP control.   Consults: Dr. Danielle [name] (neurosurgery)   Significant Diagnostic Studies:  Ir Epidurography  09/01/2011  IMPRESSIONS: Technically successful first interlaminar epidural steroid injection on the right at L3-4.   Brief H and P:  77 year old male with history of hypertension hyperlipidemia carotid artery occlusion status post left-sided carotid endarterectomy, history of abdominal at aneurysm repair has been experiencing low back pain worsening over the last 3-4 days. [name] states that he has been having low back pain for long time and was told he had degenerative discs in his low back. He has had an MRI of his lumbosacral spine on November 13 the results of which I am not able to access. [name] states that the results are showing some impingement of nerve root. Over the last 3-4 days as patient's pain has gotten worse they did call on call physician who called in hydrocodone. Hydrocodone helped initially but patient's pain became worse that they had to call EMS to take the patient to the PCPs office today. Since he is having intractable pain he has been directly admitted to [facility] for further workup.  The pain is usually low back radiates  to the right leg. He has not had any incontinence of urine or bowel. Patient denies any fever chills, chest pain or shortness of breath, denies any nausea vomiting or bowel pain dysuria discharges or diarrhea. Patient has been having recurrent syncopal episodes. The last one was 4 weeks ago. When they checked his orthostatics he was found to be severely orthostatic with blood pressure systolic dropping from 120 to the 70s. His PCP Dr. Pete [name] discontinued his antihypertensives, after which he has not had any further  syncopal episodes. On admission his blood pressure was found to be high with systolic around 180s.   Physical Exam on Discharge:  Filed Vitals:   09/04/11 1444 09/04/11 2200 09/04/11 2300 09/05/11 0600  BP: 162/62 192/75 161/72 208/72  Pulse: 76 79  78  Temp: 98.2 F (36.8 C) 97.6 F (36.4 C)  97.8 F (36.6 C)  TempSrc: Oral     Resp: 20 22  22   Height:      Weight:      SpO2: 97% 97%  93%     Intake/Output Summary (Last 24 hours) at 09/05/11 1610 Last data filed at 09/05/11 0700  Gross per 24 hour  Intake    760 ml  Output     25 ml  Net    735 ml    General: Alert, awake, oriented x3, in no acute distress. HEENT: No bruits, no goiter. Heart: Regular rate and rhythm, without murmurs, rubs, gallops. Lungs: Clear to auscultation bilaterally. Abdomen: Soft, nontender, nondistended, positive bowel sounds. Extremities: No clubbing cyanosis or edema with positive pedal pulses. Neuro: Grossly intact, nonfocal.  CBC:    Component Value Date/Time   WBC 8.8 09/02/2011 0610   HGB 13.0 09/02/2011 0610   HCT 40.5 09/02/2011 0610   PLT 152 09/02/2011 0610   MCV 89.4 09/02/2011 0610   NEUTROABS 6.5 05/10/2009 1132   LYMPHSABS 0.7 05/10/2009 1132   MONOABS 0.5 05/10/2009 1132   EOSABS 0.2 05/10/2009 1132   BASOSABS 0.0 05/10/2009 1132    Basic Metabolic Panel:    Component Value Date/Time   NA 143 09/02/2011 0610   K 3.9 09/02/2011 0610   CL 110 09/02/2011 0610   CO2 23 09/02/2011 0610   BUN 20 09/02/2011 0610   CREATININE 1.02 09/02/2011 0610   GLUCOSE 108* 09/02/2011 0610   CALCIUM 9.4 09/02/2011 0610    Hospital Course:   Principal Problem:  *Low back pain - currently well controlled, no agitation over night, continue PO pain medication PRN.   Active Problems:  Hypertension - not well controlled, start norvasc 1-0 mg QD   Hyperlipidemia - continue statin   Disposition - SNF placement today if bed available    Time spent on Discharge: Over 30  minutes  Signed: MAGICK-Diann Bangerter 09/05/2011, 8:23 AM

## 2011-09-05 NOTE — Progress Notes (Signed)
Pt. Has blood tinged urine, moderate amount. Pt denies burning or irritation. Under no s/s distress. Will continue to monitor.

## 2011-09-06 ENCOUNTER — Inpatient Hospital Stay (HOSPITAL_COMMUNITY): Payer: Federal, State, Local not specified - PPO

## 2011-09-06 DIAGNOSIS — S63259A Unspecified dislocation of unspecified finger, initial encounter: Secondary | ICD-10-CM | POA: Diagnosis not present

## 2011-09-06 DIAGNOSIS — M25473 Effusion, unspecified ankle: Secondary | ICD-10-CM | POA: Diagnosis not present

## 2011-09-06 MED ORDER — AMLODIPINE BESYLATE 10 MG PO TABS
10.0000 mg | ORAL_TABLET | Freq: Every day | ORAL | Status: DC
  Administered 2011-09-06 – 2011-09-07 (×2): 10 mg via ORAL
  Filled 2011-09-06 (×2): qty 1

## 2011-09-06 NOTE — Progress Notes (Signed)
Patient ID: [name] [name], male   DOB: 1922/11/12, 77 y.o.   MRN: [id number]  Subjective: No events overnight. Patient denies chest pain, shortness of breath, abdominal pain. Had bowel movement and reports ambulating with difficulty due to right ankle pain. Objective:  Vital signs in last 24 hours:  Filed Vitals:   09/05/11 2209 09/06/11 0530 09/06/11 0600 09/06/11 0620  BP:  195/86 129/67 203/73  Pulse:  70 77 67  Temp: 97.7 F (36.5 C) 97.7 F (36.5 C) 98.2 F (36.8 C)   TempSrc: Oral Oral Oral   Resp: 18 18 18    Height:      Weight:      SpO2: 94% 96% 95%     Intake/Output from previous day:   Intake/Output Summary (Last 24 hours) at 09/06/11 1026 Last data filed at 09/06/11 8756  Gross per 24 hour  Intake      0 ml  Output    375 ml  Net   -375 ml    Physical Exam: General: Alert, awake, oriented x3, in no acute distress. HEENT: No bruits, no goiter. Moist mucous membranes, no scleral icterus, no conjunctival pallor. Heart: Regular rate and rhythm, without murmurs, rubs, gallops. Lungs: Clear to auscultation bilaterally. No wheezing, no rhonchi, no rales.  Abdomen: Soft, nontender, nondistended, positive bowel sounds. Extremities: No clubbing cyanosis. Bilateral trace pitting edema present. Right ankle swelling. DP and PT present and equal bilaterally. Neuro: Grossly intact, nonfocal.   Lab Results:  Basic Metabolic Panel:    Component Value Date/Time   NA 143 09/02/2011 0610   K 3.9 09/02/2011 0610   CL 110 09/02/2011 0610   CO2 23 09/02/2011 0610   BUN 20 09/02/2011 0610   CREATININE 1.02 09/02/2011 0610   GLUCOSE 108* 09/02/2011 0610   CALCIUM 9.4 09/02/2011 0610   CBC:    Component Value Date/Time   WBC 8.8 09/02/2011 0610   HGB 13.0 09/02/2011 0610   HCT 40.5 09/02/2011 0610   PLT 152 09/02/2011 0610   MCV 89.4 09/02/2011 0610   NEUTROABS 6.5 05/10/2009 1132   LYMPHSABS 0.7 05/10/2009 1132   MONOABS 0.5 05/10/2009 1132   EOSABS 0.2 05/10/2009 1132     BASOSABS 0.0 05/10/2009 1132      Lab 09/02/11 0610 09/01/11 0735 08/31/11 2056  WBC 8.8 7.6 8.0  HGB 13.0 12.9* 13.4  HCT 40.5 40.1 40.9  PLT 152 152 162  MCV 89.4 89.7 89.7  MCH 28.7 28.9 29.4  MCHC 32.1 32.2 32.8  RDW 15.1 15.1 15.0  LYMPHSABS -- -- --  MONOABS -- -- --  EOSABS -- -- --  BASOSABS -- -- --  BANDABS -- -- --    Lab 09/02/11 0610 09/01/11 0735 08/31/11 2056  NA 143 143 144  K 3.9 3.5 3.7  CL 110 110 109  CO2 23 24 27   GLUCOSE 108* 87 127*  BUN 20 19 24*  CREATININE 1.02 1.16 1.33  CALCIUM 9.4 8.9 9.3  MG -- -- 2.4   No results found for this basename: INR:5,PROTIME:5 in the last 168 hours Cardiac markers: No results found for this basename: CK:3,CKMB:3,TROPONINI:3,MYOGLOBIN:3 in the last 168 hours No results found for this basename: POCBNP:3 in the last 168 hours No results found for this or any previous visit (from the past 240 hour(s)).  Studies/Results: Dg Hand 2 View Left  09/05/2011  IMPRESSION: Dislocation of the third finger PIP joint.  Irregularity and lucency involving the thumb distal phalanx probably related to chronic  changes or old injury.  Recommend clinical correlation in this area.  Old trauma to the left wrist.     Medications: Scheduled Meds:   . aspirin EC  81 mg Oral Daily  . buPROPion  100 mg Oral Daily  . cephALEXin  500 mg Oral Q8H  . escitalopram  10 mg Oral Daily  . folic acid  1 mg Oral Daily  . multivitamins ther. w/minerals  1 tablet Oral Daily  . rosuvastatin  20 mg Oral QHS  . thiamine  100 mg Oral Daily   Continuous Infusions:   . sodium chloride 75 mL/hr at 09/01/11 2321   PRN Meds:.acetaminophen, acetaminophen, haloperidol lactate, hydrALAZINE, ondansetron (ZOFRAN) IV, ondansetron, oxyCODONE, triamcinolone  Assessment/Plan:  Principal Problem:  *Low back pain - clinically stable and pain controlled on current medication regimen  Active Problems:  Hypertension - add Norvasc to the regimen    Hyperlipidemia - continue statin   Finger dislocation - ortho called and follow up on recommendations - continue Keflex and dressing changes   Ankle swelling - obtain xray of the right foot and ankle - follow up on results   Disposition  - anticipate d/c in AM   LOS: 6 days   MAGICK-Mak Bonny 09/06/2011, 10:26 AM

## 2011-09-07 LAB — CBC
MCH: 29 pg (ref 26.0–34.0)
MCV: 89.3 fL (ref 78.0–100.0)
Platelets: 170 10*3/uL (ref 150–400)
RDW: 15.1 % (ref 11.5–15.5)
WBC: 9.2 10*3/uL (ref 4.0–10.5)

## 2011-09-07 LAB — BASIC METABOLIC PANEL
Calcium: 9.1 mg/dL (ref 8.4–10.5)
Chloride: 109 mEq/L (ref 96–112)
Creatinine, Ser: 0.97 mg/dL (ref 0.50–1.35)
GFR calc Af Amer: 84 mL/min — ABNORMAL LOW (ref >90)

## 2011-09-07 MED ORDER — FLEET ENEMA 7-19 GM/118ML RE ENEM
1.0000 | ENEMA | Freq: Once | RECTAL | Status: AC
  Administered 2011-09-07: 1 via RECTAL
  Filled 2011-09-07: qty 1

## 2011-09-07 MED ORDER — OXYCODONE HCL 5 MG PO TABS: 5.0000 mg | ORAL_TABLET | ORAL | Status: AC | PRN

## 2011-09-07 MED ORDER — BISACODYL 10 MG RE SUPP
10.0000 mg | RECTAL | Status: AC
  Administered 2011-09-07: 10 mg via RECTAL
  Filled 2011-09-07: qty 1

## 2011-09-07 MED ORDER — AMLODIPINE BESYLATE 10 MG PO TABS: 10.0000 mg | ORAL_TABLET | Freq: Every day | ORAL | Status: DC

## 2011-09-07 NOTE — Progress Notes (Signed)
Patient and wife given D/C instructions, education and follow-up information. Pt will be going to SNF for Rehab. In no signs of acute distress.

## 2011-09-07 NOTE — Progress Notes (Signed)
CSW faxed dc summary and medications to SNF. SNF agreeable to admission today. CSW prepared dc packet. CSW spoke with wife who will still transport patient to SNF. CSW informed RN of plan. CSW is signing off but is available if needed. [facility] [place] [phone]

## 2011-09-07 NOTE — Progress Notes (Signed)
Utilization Review Completed.[name] [name] T12/12/2010   

## 2011-09-07 NOTE — Discharge Summary (Signed)
Patient ID: TEMITOPE GRIFFING MRN: [id number] DOB/AGE: 75-Mar-1924 75 y.o.  Admit date: 08/31/2011 Discharge date: 09/07/2011  Primary Care Physician:  Ginette Otto, MD, MD  Discharge Diagnoses:    Present on Admission:  .Low back pain .Hypertension .Hyperlipidemia  Principal Problem:  *Low back pain Active Problems:  Hypertension  Hyperlipidemia  Finger dislocation  Ankle swelling   Current Discharge Medication List    START taking these medications   Details  amLODipine (NORVASC) 10 MG tablet Take 1 tablet (10 mg total) by mouth daily. Qty: 31 tablet, Refills: 1    cephALEXin (KEFLEX) 500 MG capsule Take 1 capsule (500 mg total) by mouth every 8 (eight) hours. Qty: 30 capsule, Refills: 0    oxyCODONE (OXY IR/ROXICODONE) 5 MG immediate release tablet Take 1 tablet (5 mg total) by mouth every 4 (four) hours as needed. Qty: 45 tablet, Refills: 0      CONTINUE these medications which have NOT CHANGED   Details  aspirin 81 MG tablet Take 81 mg by mouth daily.      buPROPion (WELLBUTRIN) 100 MG tablet Take 100 mg by mouth daily.      Coenzyme Q10 (CO Q 10) 100 MG CAPS Take 1 capsule by mouth daily.      CRESTOR 20 MG tablet Take 20 mg by mouth Daily.    escitalopram (LEXAPRO) 10 MG tablet Take 10 mg by mouth daily.      Multiple Vitamins-Minerals (MULTIVITAMINS THER. W/MINERALS) TABS Take 1 tablet by mouth daily.      OVER THE COUNTER MEDICATION Take 1 tablet by mouth daily. Over the counter constipation supplement     SAW PALMETTO, SERENOA REPENS, PO Take 3 tablets by mouth daily.      sodium chloride (OCEAN) 0.65 % nasal spray Place 2 sprays into the nose daily as needed. For dryness     triamcinolone (KENALOG) 0.025 % cream Apply 1 application topically daily as needed. For dryness        Disposition and Follow-up: Pt will need follow up with primary care physician in 2-4 weeks. He will need to have CBC and BMP repeated to ensure that blood work is  stable and at patient's baseline. Please also follow up on back pain control. Pt has received epidural steroid injection by Dr. Danielle Dess and he has reported an improvement in pain. In addition, pt's BP medications were held during the hospitalization as some were consider to contribute to his weakness and syncopal episodes. His BP needs to be reevaluated in an outpatient setting to decide on antihypertensive treatment since pt's BP has been persistently increased in 170-180's/100's. Please note that the appointment with Dr. Kristine Linea has been scheduled for 09/14/2011 at 2:30 pm.  Consults: Dr. Danielle Dess (neurosurgery)  Significant Diagnostic Studies:  Ir Epidurography  09/01/2011  IMPRESSIONS: Technically successful first interlaminar epidural steroid injection on the right at L3-4.    Xray of the left hand: IMPRESSION:  Dislocation of the third finger PIP joint.  Irregularity and lucency involving the thumb distal phalanx  probably related to chronic changes or old injury. Recommend  clinical correlation in this area.    Brief H and P:  77 year old male with history of hypertension hyperlipidemia carotid artery occlusion status post left-sided carotid endarterectomy, history of abdominal at aneurysm repair has been experiencing low back pain worsening over the last 3-4 days. [name] states that he has been having low back pain for long time and was told he had degenerative discs in his low  back. He has had an MRI of his lumbosacral spine on November 13 the results of which I am not able to access. [name] states that the results are showing some impingement of nerve root. Over the last 3-4 days as patient's pain has gotten worse they did call on call physician who called in hydrocodone. Hydrocodone helped initially but patient's pain became worse that they had to call EMS to take the patient to the PCPs office today. Since he is having intractable pain he has been directly admitted to [facility] for further workup.  The pain is usually low back radiates to the right leg. He has not had any incontinence of urine or bowel. Patient denies any fever chills, chest pain or shortness of breath, denies any nausea vomiting or bowel pain dysuria discharges or diarrhea. Patient has been having recurrent syncopal episodes. The last one was 4 weeks ago. When they checked his orthostatics he was found to be severely orthostatic with blood pressure systolic dropping from 120 to the 70s. His PCP Dr. Pete Glatter discontinued his antihypertensives, after which he has not had any further syncopal episodes. On admission his blood pressure was found to be high with systolic around 180s.  Physical Exam on Discharge:  Filed Vitals:   09/06/11 0620 09/06/11 1445 09/06/11 2200 09/07/11 0600  BP: 203/73 144/65 160/60 201/74  Pulse: 67 91 73 71  Temp:  98.2 F (36.8 C) 97.5 F (36.4 C) 97.7 F (36.5 C)  TempSrc:  Oral    Resp:  18 18 18   Height:      Weight:      SpO2:  91% 94% 91%    No intake or output data in the 24 hours ending 09/07/11 0910  General: Alert, awake, oriented x3, in no acute distress. HEENT: No bruits, no goiter. Heart: Regular rate and rhythm, without murmurs, rubs, gallops. Lungs: Clear to auscultation bilaterally. Abdomen: Soft, nontender, nondistended, positive bowel sounds. Extremities: No clubbing cyanosis or edema with positive pedal pulses. Neuro: Grossly intact, nonfocal.  CBC:    Component Value Date/Time   WBC 9.2 09/07/2011 0600   HGB 13.3 09/07/2011 0600   HCT 40.9 09/07/2011 0600   PLT 170 09/07/2011 0600   MCV 89.3 09/07/2011 0600   NEUTROABS 6.5 05/10/2009 1132   LYMPHSABS 0.7 05/10/2009 1132   MONOABS 0.5 05/10/2009 1132   EOSABS 0.2 05/10/2009 1132   BASOSABS 0.0 05/10/2009 1132    Basic Metabolic Panel:    Component Value Date/Time   NA 142 09/07/2011 0600   K 3.8 09/07/2011 0600   CL 109 09/07/2011 0600   CO2 24 09/07/2011 0600   BUN 22 09/07/2011 0600    CREATININE 0.97 09/07/2011 0600   GLUCOSE 95 09/07/2011 0600   CALCIUM 9.1 09/07/2011 0600    Hospital Course:  Principal Problem:  *Low back pain - clinically stable and pt will have to follow up with Dr. Danielle Dess as needed  Active Problems:  Hypertension - I have started pt on Amlodipine and he seemed to tolerate medication well, his BP has remained in 170-180's /100's during the hospitalization   Hyperlipidemia - stable   Finger dislocation - I have scheduled an appointment with Dr. Kristine Linea on 09/14/2011 at 2:30 pm   Ankle swelling - XRAY negative for acute dislocation or fracture  Disposition  - plan d/c today   Time spent on Discharge: Over 30 minutes  Signed: MAGICK-Analisia Kingsford 09/07/2011, 9:10 AM

## 2011-09-07 NOTE — Progress Notes (Signed)
Physical Therapy Treatment Patient Details Name: [name] [name] MRN: [id number] DOB: Apr 05, 1923 Today's Date: 09/07/2011  PT Assessment/Plan  PT - Assessment/Plan Comments on Treatment Session: Patient is s/p back pain with decreased mobility secondary to deconditioning.  Needs continued PT at [facility]  To D/C today to NH.   PT Plan: Discharge plan remains appropriate;Frequency remains appropriate PT Frequency: Min 3X/week Follow Up Recommendations: Skilled nursing facility;24 hour supervision/assistance Equipment Recommended: Defer to next venue PT Goals  Acute Rehab PT Goals PT Goal Formulation: With patient Time For Goal Achievement: 7 days PT Goal: Rolling Supine to Right Side - Progress: Met PT Goal: Rolling Supine to Left Side - Progress: Met PT Goal: Supine/Side to Sit - Progress: Met PT Goal: Sit at Edge Of Bed - Progress: Met PT Goal: Sit to Supine/Side - Progress: Other (comment) PT Transfer Goal: Bed to Chair/Chair to Bed - Progress: Partly met PT Goal: Stand - Progress: Progressing toward goal PT Goal: Ambulate - Progress: Progressing toward goal Additional Goals PT Goal: Additional Goal #1 - Progress: Progressing toward goal  PT Treatment Precautions/Restrictions  Precautions Precautions: Fall Required Braces or Orthoses: No Restrictions Weight Bearing Restrictions: No Mobility (including Balance) Bed Mobility Rolling Right: 4: Min assist;With rail Rolling Right Details (indicate cue type and reason): Needed cues for technique.  Incr. time secondary to postural weakness.   Supine to Sit: 4: Min assist;With rails;HOB flat Supine to Sit Details (indicate cue type and reason): Assist to trunk for anterior translation over BOS.  Cues for sequence as well.  Transfers Sit to Stand: 1: +2 Total assist;Patient percentage (comment);With upper extremity assist;From bed (pt = 50%) Sit to Stand Details (indicate cue type and reason): Pt. again has difficulty with anterior  translation of trunk needing facilitation as well as cues for hand placement.  Takes incr. time to obtain balance in standing upon initial standing.   Stand to Sit: 4: Min assist;With upper extremity assist;With armrests;To chair/3-in-1 Stand to Sit Details: Assist for controlled descent into chair and for hand placement. Ambulation/Gait Ambulation/Gait Assistance: 1: +2 Total assist;Patient percentage (comment) (pt = 70 % with +1 to follow with chair) Ambulation/Gait Assistance Details (indicate cue type and reason): Assist for balance and safety with HHA with cues to extend trunk and take big steps as pt. shuffles.  Staggered occasionally. Ambulation Distance (Feet): 150 Feet Assistive device: 1 person hand held assist Gait Pattern: Decreased step length - right;Shuffle;Trunk flexed;Trunk rotated posteriorly on right Gait velocity: Decreased gait velocity. Stairs: No Wheelchair Mobility Wheelchair Mobility: No  Posture/Postural Control Posture/Postural Control: Postural limitations Postural Limitations: Forward flexion in sitting and standding.   Balance Balance Assessed: Yes Dynamic Standing Balance Dynamic Standing - Balance Support: Right upper extremity supported;During functional activity Dynamic Standing - Level of Assistance: 4: Min assist Dynamic Standing - Balance Activities: Lateral lean/weight shifting;Forward lean/weight shifting Exercise    End of Session PT - End of Session Equipment Utilized During Treatment: Gait belt Activity Tolerance: Patient limited by fatigue Patient left: in chair;with call bell in reach;with family/visitor present Nurse Communication: Mobility status for ambulation General Behavior During Session: [facility] for tasks performed Cognition: Impaired Cognitive Impairment: Slow to process information with continued confusion.  WFL for activity.  [name] [name] 09/07/2011, 4:04 PM [name] [name] [name] [name] [phone] (pager)

## 2011-10-06 DIAGNOSIS — I639 Cerebral infarction, unspecified: Secondary | ICD-10-CM

## 2011-10-06 HISTORY — DX: Cerebral infarction, unspecified: I63.9

## 2011-12-15 ENCOUNTER — Emergency Department (HOSPITAL_COMMUNITY): Payer: Federal, State, Local not specified - PPO

## 2011-12-15 ENCOUNTER — Emergency Department (HOSPITAL_COMMUNITY)
Admission: EM | Admit: 2011-12-15 | Discharge: 2011-12-16 | Disposition: A | Discharge status: Home Health | Payer: Federal, State, Local not specified - PPO | Attending: Emergency Medicine | Admitting: Emergency Medicine

## 2011-12-15 ENCOUNTER — Encounter (HOSPITAL_COMMUNITY): Payer: Self-pay

## 2011-12-15 DIAGNOSIS — W19XXXA Unspecified fall, initial encounter: Secondary | ICD-10-CM

## 2011-12-15 DIAGNOSIS — Z79899 Other long term (current) drug therapy: Secondary | ICD-10-CM | POA: Insufficient documentation

## 2011-12-15 DIAGNOSIS — W010XXA Fall on same level from slipping, tripping and stumbling without subsequent striking against object, initial encounter: Secondary | ICD-10-CM | POA: Insufficient documentation

## 2011-12-15 DIAGNOSIS — S61409A Unspecified open wound of unspecified hand, initial encounter: Secondary | ICD-10-CM | POA: Insufficient documentation

## 2011-12-15 DIAGNOSIS — F4310 Post-traumatic stress disorder, unspecified: Secondary | ICD-10-CM | POA: Insufficient documentation

## 2011-12-15 DIAGNOSIS — S0003XA Contusion of scalp, initial encounter: Secondary | ICD-10-CM | POA: Insufficient documentation

## 2011-12-15 DIAGNOSIS — IMO0002 Reserved for concepts with insufficient information to code with codable children: Secondary | ICD-10-CM | POA: Insufficient documentation

## 2011-12-15 DIAGNOSIS — I10 Essential (primary) hypertension: Secondary | ICD-10-CM | POA: Insufficient documentation

## 2011-12-15 DIAGNOSIS — S0120XA Unspecified open wound of nose, initial encounter: Secondary | ICD-10-CM | POA: Insufficient documentation

## 2011-12-15 DIAGNOSIS — Z7982 Long term (current) use of aspirin: Secondary | ICD-10-CM | POA: Insufficient documentation

## 2011-12-15 DIAGNOSIS — S0083XA Contusion of other part of head, initial encounter: Secondary | ICD-10-CM | POA: Insufficient documentation

## 2011-12-15 DIAGNOSIS — S0180XA Unspecified open wound of other part of head, initial encounter: Secondary | ICD-10-CM | POA: Insufficient documentation

## 2011-12-15 DIAGNOSIS — F341 Dysthymic disorder: Secondary | ICD-10-CM | POA: Insufficient documentation

## 2011-12-15 DIAGNOSIS — E785 Hyperlipidemia, unspecified: Secondary | ICD-10-CM | POA: Insufficient documentation

## 2011-12-15 DIAGNOSIS — S022XXA Fracture of nasal bones, initial encounter for closed fracture: Secondary | ICD-10-CM

## 2011-12-15 DIAGNOSIS — M129 Arthropathy, unspecified: Secondary | ICD-10-CM | POA: Insufficient documentation

## 2011-12-15 LAB — CBC
HCT: 41.4 % (ref 39.0–52.0)
Hemoglobin: 13.7 g/dL (ref 13.0–17.0)
MCH: 29.1 pg (ref 26.0–34.0)
MCHC: 33.1 g/dL (ref 30.0–36.0)
RDW: 14.4 % (ref 11.5–15.5)

## 2011-12-15 MED ORDER — HYDROCODONE-ACETAMINOPHEN 5-325 MG PO TABS: 1.0000 | ORAL_TABLET | Freq: Four times a day (QID) | ORAL | Status: AC | PRN

## 2011-12-15 MED ORDER — SILVER NITRATE-POT NITRATE 75-25 % EX MISC
1.0000 | CUTANEOUS | Status: AC
  Administered 2011-12-15: 1 via TOPICAL
  Filled 2011-12-15: qty 1

## 2011-12-15 MED ORDER — CLINDAMYCIN PHOSPHATE 600 MG/50ML IV SOLN
600.0000 mg | Freq: Once | INTRAVENOUS | Status: AC
  Administered 2011-12-15: 600 mg via INTRAVENOUS
  Filled 2011-12-15: qty 50

## 2011-12-15 NOTE — ED Notes (Signed)
PA continues at bedside.

## 2011-12-15 NOTE — ED Notes (Signed)
PA at bedside for lac repair.

## 2011-12-15 NOTE — ED Notes (Signed)
Pt returned from CT °

## 2011-12-15 NOTE — ED Notes (Signed)
Patient transported to CT and xray 

## 2011-12-15 NOTE — Discharge Instructions (Signed)
Call Dr. Ellyn Hack office first thing tomorrow morning to schedule a followup appointment for tomorrow afternoon.  As we discussed, sleep in a recliner tonight to help with breathing and continue to ice your nose.   Nasal Fracture A nasal fracture is a break or crack in the bones of the nose. A minor break usually heals in a month. You often will receive black eyes from a nasal fracture. This is not a cause for concern. The black eyes will go away over 1 to 2 weeks.  DIAGNOSIS  Your caregiver may want to examine you if you are concerned about a fracture of the nose. X-rays of the nose may not show a nasal fracture even when one is present. Sometimes your caregiver must wait 1 to 5 days after the injury to re-check the nose for alignment and to take additional X-rays. Sometimes the caregiver must wait until the swelling has gone down. TREATMENT Minor fractures that have caused no deformity often do not require treatment. More serious fractures where bones are displaced may require surgery. This will take place after the swelling is gone. Surgery will stabilize and align the fracture. HOME CARE INSTRUCTIONS   Put ice on the injured area.   Put ice in a plastic bag.   Place a towel between your skin and the bag.   Leave the ice on for 15 to 20 minutes, 3 to 4 times a day.   Take medications as directed by your caregiver.   Only take over-the-counter or prescription medicines for pain, discomfort, or fever as directed by your caregiver.   If your nose starts bleeding, squeeze the soft parts of the nose against the center wall while you are sitting in an upright position for 10 minutes.   Contact sports should be avoided for at least 3 to 4 weeks or as directed by your caregiver.  SEEK MEDICAL CARE IF:  Your pain increases or becomes severe.   You continue to have nosebleeds.   The shape of your nose does not return to normal within 5 days.   You have pus draining from the nose.  SEEK  IMMEDIATE MEDICAL CARE IF:   You have bleeding from your nose that does not stop after 20 minutes of pinching the nostrils closed and keeping ice on the nose.   You have clear fluid draining from your nose.   You notice a grape-like swelling on the dividing wall between the nostrils (septum). This is a collection of blood (hematoma) that must be drained to help prevent infection.   You have difficulty moving your eyes.   You have recurrent vomiting.  Document Released: 09/18/2000 Document Revised: 09/10/2011 Document Reviewed: 01/05/2011 [facility] Patient Information 2012 [place], [place].

## 2011-12-15 NOTE — ED Provider Notes (Signed)
History     CSN: [id number]  Arrival date & time 12/15/11  4098   First MD Initiated Contact with Patient 12/15/11 1807      Chief Complaint  Patient presents with  . Fall    Pt was getting drink out of fridge, tripped and fell and cut nose and lt hand and has lac on forehead as well.  Pt denies any other complaints.  Pt denies LOC.  Pt alert and oriented x4.  Pt takes ASA but no blood thinners.    (Consider location/radiation/quality/duration/timing/severity/associated sxs/prior treatment) HPI Comments: Patient with a history of hyperlipidemia, hypertension, AAA, and depression presents emergency Department with a chief complaint of fall.  Patient states he was getting a drink out of the fridge when he tripped.  Patient attempted to catch his fall with his left hand however the point of impact was his nose.  Patient denies loss of consciousness and reports taking a baby aspirin every day but no other blood thinner use.  Patient denies headaches, nausea, vomiting, abdominal pain, syncope, chest pain, shortness of breath, change in vision.  The history is provided by the patient.    Past Medical History  Diagnosis Date  . Hypertension   . Hyperlipidemia   . Carotid artery occlusion   . AAA (abdominal aortic aneurysm)   . Valvular heart disease   . Hyperlipidemia   . Cancer     "skin cancer removed from top of head"  . Arthritis     "in his spine"  . Refusal of blood transfusions as patient is Jehovah's Witness 08/31/11  . PTSD (post-traumatic stress disorder)   . Depression     "terrible; from the PTSD"  . Anxiety   . Nightmares   . Spinal stenosis     Past Surgical History  Procedure Date  . Wrist surgery 2003    left; S/P fracture; "put a plate in it"  . Abdominal aortic aneurysm repair 2002  . Carotid endarterectomy 2004    left  . Joint replacement   . Total knee arthroplasty 2000's    bilaterally  . Cataract extraction w/ intraocular lens  implant, bilateral ~  2010    Family History  Problem Relation Age of Onset  . Heart disease Father   . Stroke Mother   . Arthritis Mother   . Hypertension Mother   . Diabetes Maternal Aunt     History  Substance Use Topics  . Smoking status: Former Smoker    Types: Cigarettes  . Smokeless tobacco: Never Used   Comment: "stopped smoking cigarettes 1969"  . Alcohol Use: 7.2 oz/week    12 Cans of beer per week      Review of Systems  Constitutional: Negative for fever, chills and appetite change.  HENT: Negative for ear pain, congestion, neck pain, neck stiffness and tinnitus.   Eyes: Negative for visual disturbance.  Respiratory: Negative for shortness of breath.   Cardiovascular: Negative for chest pain and leg swelling.  Gastrointestinal: Negative for abdominal pain.  Genitourinary: Negative for dysuria, urgency and frequency.  Skin: Positive for wound.  Neurological: Negative for dizziness, syncope, weakness, light-headedness, numbness and headaches.  Psychiatric/Behavioral: Negative for confusion.  All other systems reviewed and are negative.    Allergies  Morphine and related  Home Medications   Current Outpatient Rx  Name Route Sig Dispense Refill  . AMLODIPINE BESYLATE 10 MG PO TABS Oral Take 1 tablet (10 mg total) by mouth daily. 31 tablet 1  . ASPIRIN  81 MG PO TABS Oral Take 81 mg by mouth daily.      . BUPROPION HCL 100 MG PO TABS Oral Take 100 mg by mouth daily.      . CO Q 10 100 MG PO CAPS Oral Take 1 capsule by mouth daily.      . CRESTOR 20 MG PO TABS Oral Take 20 mg by mouth Daily.    Marland Kitchen ESCITALOPRAM OXALATE 10 MG PO TABS Oral Take 10 mg by mouth daily.      Carma Leaven M PLUS PO TABS Oral Take 1 tablet by mouth daily.      Marland Kitchen OVER THE COUNTER MEDICATION Oral Take 1 tablet by mouth daily. Over the counter constipation supplement     . SAW PALMETTO (SERENOA REPENS) PO Oral Take 3 tablets by mouth daily.      Marland Kitchen SALINE NASAL SPRAY 0.65 % NA SOLN Nasal Place 2 sprays into the  nose daily as needed. For dryness     . TRIAMCINOLONE ACETONIDE 0.025 % EX CREA Topical Apply 1 application topically daily as needed. For dryness      There were no vitals taken for this visit.  Physical Exam  Nursing note and vitals reviewed. Constitutional: He is oriented to person, place, and time. He appears well-developed and well-nourished. No distress.  HENT:  Head: Normocephalic. Head is with abrasion, with contusion and with laceration.    Nose: Nose lacerations present.    Eyes: Conjunctivae and EOM are normal. Pupils are equal, round, and reactive to light. No scleral icterus.  Neck: Normal range of motion and full passive range of motion without pain. Neck supple. No JVD present. Carotid bruit is not present. No rigidity. No Brudzinski's sign noted.  Cardiovascular: Normal rate, regular rhythm, normal heart sounds and intact distal pulses.   Pulmonary/Chest: Effort normal and breath sounds normal. No respiratory distress. He has no wheezes. He has no rales.  Musculoskeletal: Normal range of motion.       FROM of all extremities & fingers  Lymphadenopathy:    He has no cervical adenopathy.  Neurological: He is alert and oriented to person, place, and time. He has normal strength. No cranial nerve deficit or sensory deficit. He displays a negative Romberg sign. Coordination and gait normal. GCS eye subscore is 4. GCS verbal subscore is 5. GCS motor subscore is 6.       A&O x3.  Able to follow commands. PERRL, EOMs, no vertical or bidirectional nystagmus. Shoulder shrug, facial muscles, tongue protrusion and swallow intact.  Motor strength 5/5 bilaterally including grip strength, triceps, hamstrings and ankle dorsiflexion.  Normal patellar DTRs.  Light touch intact in all 4 distal limbs.  Intact finger to nose, shin to heel and rapid alternating movements. No ataxia or dysequilibrium.   Skin: Skin is warm and dry. Abrasion, ecchymosis and laceration noted. No rash noted. He is not  diaphoretic.       Multiple abrasions: forehead, distal tip of 5th finger right hand, nose bridge, right forearm. Lacerations: nose bridge, distal tip of nose, left hand palmer surface and medial palmer area.   Psychiatric: He has a normal mood and affect. His behavior is normal.    ED Course  Procedures (including critical care time)  The patient denies any neck pain. There is no tenderness on palpation of the cervical spine and no step-offs. The patient can look to the left and right voluntarily without pain and flex and extend the neck without pain. Cervical  collar cleared on PE, however there are distracting injuries and CT Cspine will be ordered to r/o possible trauma. CT head and maxilla facial pending.    Labs Reviewed - No data to display No results found.  LACERATION REPAIR Performed by: Jaci Carrel Authorized by: Jaci Carrel Consent: Verbal consent obtained. Risks and benefits: risks, benefits and alternatives were discussed Consent given by: patient Patient identity confirmed: provided demographic data Prepped and Draped in normal sterile fashion Wound explored  Laceration Location/length :  1. Nose bridge, 2cm, 5.0 prolene, 3 sutures 2. Dital tip of nose, 2.5cm,5.0 prolene, 5 sutures 3. Left hand palmer surface, 1 cm, 4.0 prolene, 3 sutures 4. L hand medal palmer area, .5 cm, 4.0 prolens, 1 suture 5. For head laceration, 3cm, Dermabond   No Foreign Bodies seen or palpated  Anesthesia: local infiltration  Local anesthetic: lidocaine 2% with out epinephrine  Anesthetic total: 5 ml  Irrigation method: syringe Amount of cleaning: standard  Technique: simple interrupted   Patient tolerance: Patient tolerated the procedure well with no immediate complications.  No diagnosis found.  9:30PM Called Radiology for CT results not crossing over. Only acute abnormality found was a comminuted nasal bone fracture & nasal septum fracture.   MDM  Fall, lacerations,  comminuted nasal bone fracture & nasal septum fracture.   Spoke with Dr. Emeline Darling, ENT who will see pt in his office tmw morning. Pt verbalizes understanding of importance for follow up. Bleeding stopped with nitrate. No active intranasal epistaxis. Pt seen by and discussed with Dr. Rosalia Hammers who agrees with my plan to dc pt with close f-u         Jaci Carrel, PA-C 12/15/11 2340

## 2011-12-15 NOTE — ED Notes (Signed)
Spouse in [place]

## 2011-12-16 ENCOUNTER — Emergency Department (HOSPITAL_COMMUNITY)
Admission: EM | Admit: 2011-12-16 | Discharge: 2011-12-16 | Discharge status: Against Medical Advice | Payer: Federal, State, Local not specified - PPO | Attending: Emergency Medicine | Admitting: Emergency Medicine

## 2011-12-17 NOTE — ED Provider Notes (Signed)
78 y.o. Male with trip and fall resulting in nasal bone fracture and lacerations.  I was present during examination and repair of injury.  Patient is to follow up with ent tomorrow.    Hilario Quarry, MD 12/17/11 1257

## 2012-02-22 ENCOUNTER — Encounter (INDEPENDENT_AMBULATORY_CARE_PROVIDER_SITE_OTHER): Payer: Federal, State, Local not specified - PPO | Admitting: Ophthalmology

## 2012-02-22 DIAGNOSIS — H3530 Unspecified macular degeneration: Secondary | ICD-10-CM

## 2012-02-22 DIAGNOSIS — H43819 Vitreous degeneration, unspecified eye: Secondary | ICD-10-CM

## 2012-03-28 ENCOUNTER — Emergency Department (HOSPITAL_COMMUNITY)
Admission: EM | Admit: 2012-03-28 | Discharge: 2012-03-28 | Disposition: A | Discharge status: Home / Self Care | Payer: Federal, State, Local not specified - PPO | Source: Home / Self Care

## 2012-03-28 ENCOUNTER — Encounter (HOSPITAL_COMMUNITY): Payer: Self-pay | Admitting: *Deleted

## 2012-03-28 DIAGNOSIS — W19XXXA Unspecified fall, initial encounter: Secondary | ICD-10-CM

## 2012-03-28 DIAGNOSIS — S51009A Unspecified open wound of unspecified elbow, initial encounter: Secondary | ICD-10-CM

## 2012-03-28 DIAGNOSIS — S51011A Laceration without foreign body of right elbow, initial encounter: Secondary | ICD-10-CM

## 2012-03-28 MED ORDER — BACITRACIN 500 UNIT/GM EX OINT
1.0000 "application " | TOPICAL_OINTMENT | Freq: Once | CUTANEOUS | Status: AC
  Administered 2012-03-28: 1 via TOPICAL

## 2012-03-28 MED ORDER — CEPHALEXIN 500 MG PO CAPS: 500.0000 mg | ORAL_CAPSULE | Freq: Four times a day (QID) | ORAL | Status: AC

## 2012-03-28 NOTE — ED Provider Notes (Signed)
Medical screening examination/treatment/procedure(s) were performed by a resident physician and as supervising physician I was immediately available for consultation/collaboration.  Leslee Home, M.D.   Reuben Likes, MD 03/28/12 2053

## 2012-03-28 NOTE — ED Provider Notes (Signed)
[name] [name] is a 78 y.o. male who presents to Urgent Care today for Right elbow laceration. Today [name] tripped and fell onto a concrete slab landing on his right elbow. He suffered a 3cm laceration. He denies any pain in his elbow. He denies any pain with ROM. He feels well otherwise.    PMH reviewed. PTSD History  Substance Use Topics  . Smoking status: Former Smoker    Types: Cigarettes  . Smokeless tobacco: Never Used   Comment: "stopped smoking cigarettes 1969"  . Alcohol Use: 7.2 oz/week    12 Cans of beer per week   ROS as above Medications reviewed. Current Facility-Administered Medications  Medication Dose Route Frequency Provider Last Rate Last Dose  . bacitracin ointment 1 application  1 application Topical Once [name] Bong, MD   1 application at 03/28/12 1950   Current Outpatient Prescriptions  Medication Sig Dispense Refill  . amLODipine (NORVASC) 5 MG tablet Take 5 mg by mouth daily.      Marland Kitchen aspirin 81 MG tablet Take 81 mg by mouth daily.        . Atorvastatin Calcium (LIPITOR PO) Take by mouth.      Marland Kitchen buPROPion (WELLBUTRIN) 100 MG tablet Take 100 mg by mouth daily.        . Coenzyme Q10 (CO Q 10) 100 MG CAPS Take 1 capsule by mouth daily.        Marland Kitchen escitalopram (LEXAPRO) 10 MG tablet Take 10 mg by mouth daily.        . fish oil-omega-3 fatty acids 1000 MG capsule Take 1 g by mouth daily.      . Multiple Vitamins-Minerals (MULTIVITAMINS THER. W/MINERALS) TABS Take 1 tablet by mouth daily.        . SAW PALMETTO, SERENOA REPENS, PO Take 3 tablets by mouth daily.        . sodium chloride (OCEAN) 0.65 % nasal spray Place 2 sprays into the nose daily as needed. For dryness       . triamcinolone (KENALOG) 0.025 % cream Apply 1 application topically daily as needed. For dryness      . cephALEXin (KEFLEX) 500 MG capsule Take 1 capsule (500 mg total) by mouth 4 (four) times daily.  40 capsule  0  . CRESTOR 20 MG tablet Take 20 mg by mouth Daily.        Exam:  BP  113/58  Pulse 72  Temp 98.6 F (37 C) (Oral)  Resp 18  SpO2 98% Gen: Well NAD Right elbow: 3cm linear laceration just proximal to olecranon point.  Laceration extends into fatty tissue under the dermis. No bone or periosteum visible.  MSK: Non-tender. Normal ROM to extension flexion and pronation and supination.   Laceration repair:  Skin cleaned with betadine. Using 7cm of 2% lidocaine the wound was anesthetized. The wound was then extensively irrigated and inspected. The skin was again cleaned with betadine. Then 3-0 proline was used. 1 horizontal mattress suture was used to bring the center of the laceration together. Then 5 simple interrupted sutures were used to close the wound. The skin was then cleaned a antibiotic ointment and a dressing was applied.  Assessment and Plan: 78 y.o. male with Laceration and fall. No evidence of fracture therefore no Xrays indicated. Wound was cleaned well and closed. There is a possibility for infection with this large wound therefore I feel that antibiotics were warranted. Plan to use keflex qid x 10 days. Also will f/u  with PCP in 2-3 days and 7-10 days for suture removal. Wound care precautions reviewed. Discussed warning signs or symptoms. Please see discharge instructions. Patient expresses understanding.      [name] Bong, MD 03/28/12 1958

## 2012-03-28 NOTE — ED Notes (Signed)
Reports turning around in garage today, stumbling, and falling onto concrete on right elbow.  Very large, full thickness laceration noted to right posterior elbow with controlled oozing of blood.  Patient does not have difficulty with ROM.  Denies any other injuries; denies hitting head.  Last tetanus 2012.

## 2012-03-28 NOTE — Discharge Instructions (Signed)
Thank you for coming in today. Please get your wound checked out in a few days.  If it gets red or starts ozzing pus or your have a fever please go to your orthopedic doctor.  Stitches should come out in 7-10 days here or with your regular doctor.  Laceration Care, Adult A laceration is a cut or lesion that goes through all layers of the skin and into the tissue just beneath the skin. TREATMENT  Some lacerations may not require closure. Some lacerations may not be able to be closed due to an increased risk of infection. It is important to see your caregiver as soon as possible after an injury to minimize the risk of infection and maximize the opportunity for successful closure. If closure is appropriate, pain medicines may be given, if needed. The wound will be cleaned to help prevent infection. Your caregiver will use stitches (sutures), staples, wound glue (adhesive), or skin adhesive strips to repair the laceration. These tools bring the skin edges together to allow for faster healing and a better cosmetic outcome. However, all wounds will heal with a scar. Once the wound has healed, scarring can be minimized by covering the wound with sunscreen during the day for 1 full year. HOME CARE INSTRUCTIONS  For sutures or staples:  Keep the wound clean and dry.   If you were given a bandage (dressing), you should change it at least once a day. Also, change the dressing if it becomes wet or dirty, or as directed by your caregiver.   Wash the wound with soap and water 2 times a day. Rinse the wound off with water to remove all soap. Pat the wound dry with a clean towel.   After cleaning, apply a thin layer of the antibiotic ointment as recommended by your caregiver. This will help prevent infection and keep the dressing from sticking.   You may shower as usual after the first 24 hours. Do not soak the wound in water until the sutures are removed.   Only take over-the-counter or prescription medicines  for pain, discomfort, or fever as directed by your caregiver.   Get your sutures or staples removed as directed by your caregiver.  For skin adhesive strips:  Keep the wound clean and dry.   Do not get the skin adhesive strips wet. You may bathe carefully, using caution to keep the wound dry.   If the wound gets wet, pat it dry with a clean towel.   Skin adhesive strips will fall off on their own. You may trim the strips as the wound heals. Do not remove skin adhesive strips that are still stuck to the wound. They will fall off in time.  For wound adhesive:  You may briefly wet your wound in the shower or bath. Do not soak or scrub the wound. Do not swim. Avoid periods of heavy perspiration until the skin adhesive has fallen off on its own. After showering or bathing, gently pat the wound dry with a clean towel.   Do not apply liquid medicine, cream medicine, or ointment medicine to your wound while the skin adhesive is in place. This may loosen the film before your wound is healed.   If a dressing is placed over the wound, be careful not to apply tape directly over the skin adhesive. This may cause the adhesive to be pulled off before the wound is healed.   Avoid prolonged exposure to sunlight or tanning lamps while the skin adhesive is in  place. Exposure to ultraviolet light in the first year will darken the scar.   The skin adhesive will usually remain in place for 5 to 10 days, then naturally fall off the skin. Do not pick at the adhesive film.  You may need a tetanus shot if:  You cannot remember when you had your last tetanus shot.   You have never had a tetanus shot.  If you get a tetanus shot, your arm may swell, get red, and feel warm to the touch. This is common and not a problem. If you need a tetanus shot and you choose not to have one, there is a rare chance of getting tetanus. Sickness from tetanus can be serious. SEEK MEDICAL CARE IF:   You have redness, swelling, or  increasing pain in the wound.   You see a red line that goes away from the wound.   You have yellowish-white fluid (pus) coming from the wound.   You have a fever.   You notice a bad smell coming from the wound or dressing.   Your wound breaks open before or after sutures have been removed.   You notice something coming out of the wound such as wood or glass.   Your wound is on your hand or foot and you cannot move a finger or toe.  SEEK IMMEDIATE MEDICAL CARE IF:   Your pain is not controlled with prescribed medicine.   You have severe swelling around the wound causing pain and numbness or a change in color in your arm, hand, leg, or foot.   Your wound splits open and starts bleeding.   You have worsening numbness, weakness, or loss of function of any joint around or beyond the wound.   You develop painful lumps near the wound or on the skin anywhere on your body.  MAKE SURE YOU:   Understand these instructions.   Will watch your condition.   Will get help right away if you are not doing well or get worse.  Document Released: 09/21/2005 Document Revised: 09/10/2011 Document Reviewed: 03/17/2011 [facility] Patient Information 2012 [place], [place].

## 2012-07-05 DIAGNOSIS — F0150 Vascular dementia without behavioral disturbance: Secondary | ICD-10-CM

## 2012-07-05 HISTORY — DX: Vascular dementia, unspecified severity, without behavioral disturbance, psychotic disturbance, mood disturbance, and anxiety: F01.50

## 2012-07-07 ENCOUNTER — Other Ambulatory Visit: Payer: Self-pay | Admitting: *Deleted

## 2012-07-07 DIAGNOSIS — I6529 Occlusion and stenosis of unspecified carotid artery: Secondary | ICD-10-CM

## 2012-07-07 DIAGNOSIS — Z48812 Encounter for surgical aftercare following surgery on the circulatory system: Secondary | ICD-10-CM

## 2012-07-12 ENCOUNTER — Ambulatory Visit: Payer: Federal, State, Local not specified - PPO | Admitting: Neurosurgery

## 2012-07-12 ENCOUNTER — Other Ambulatory Visit: Payer: Federal, State, Local not specified - PPO

## 2012-07-18 ENCOUNTER — Encounter: Payer: Self-pay | Admitting: Neurosurgery

## 2012-07-19 ENCOUNTER — Ambulatory Visit (INDEPENDENT_AMBULATORY_CARE_PROVIDER_SITE_OTHER): Payer: Federal, State, Local not specified - PPO | Admitting: Neurosurgery

## 2012-07-19 ENCOUNTER — Other Ambulatory Visit (INDEPENDENT_AMBULATORY_CARE_PROVIDER_SITE_OTHER): Payer: Federal, State, Local not specified - PPO | Admitting: *Deleted

## 2012-07-19 ENCOUNTER — Encounter: Payer: Self-pay | Admitting: Neurosurgery

## 2012-07-19 VITALS — BP 150/73 | HR 71 | Resp 16 | Ht 68.0 in | Wt 174.4 lb

## 2012-07-19 DIAGNOSIS — Z48812 Encounter for surgical aftercare following surgery on the circulatory system: Secondary | ICD-10-CM

## 2012-07-19 DIAGNOSIS — I6529 Occlusion and stenosis of unspecified carotid artery: Secondary | ICD-10-CM

## 2012-07-19 NOTE — Addendum Note (Signed)
Addended by: Dannielle Karvonen on: 07/19/2012 03:49 PM   Modules accepted: Orders

## 2012-07-19 NOTE — Progress Notes (Signed)
[facility] [place] Carotid Office Note  CC: Annual carotid surveillance Referring Physician: Early  History of Present Illness: 78 year old male patient of Dr. Arbie Cookey status post left CEA in 2006. The patient denies any signs or symptoms of CVA, TIA, amaurosis fugax or any neural deficit. The patient is seen today with his wife he reports no other medical issues.  Past Medical History  Diagnosis Date  . Hypertension   . Hyperlipidemia   . Carotid artery occlusion   . AAA (abdominal aortic aneurysm)   . Valvular heart disease   . Hyperlipidemia   . Cancer     "skin cancer removed from top of head"  . Arthritis     "in his spine"  . Refusal of blood transfusions as patient is Jehovah's Witness 08/31/11  . PTSD (post-traumatic stress disorder)   . Depression     "terrible; from the PTSD"  . Anxiety   . Nightmares   . Spinal stenosis   . Memory loss   . Dementia, vascular Oct. 2013  . Stroke     X three    ROS: [x]  Positive   [ ]  Denies    General: [ ]  Weight loss, [ ]  Fever, [ ]  chills Neurologic: [ ]  Dizziness, [ ]  Blackouts, [ ]  Seizure [ ]  Stroke, [ ]  "Mini stroke", [ ]  Slurred speech, [ ]  Temporary blindness; [ ]  weakness in arms or legs, [ ]  Hoarseness Cardiac: [ ]  Chest pain/pressure, [ ]  Shortness of breath at rest [ ]  Shortness of breath with exertion, [ ]  Atrial fibrillation or irregular heartbeat Vascular: [ ]  Pain in legs with walking, [ ]  Pain in legs at rest, [ ]  Pain in legs at night,  [ ]  Non-healing ulcer, [ ]  Blood clot in vein/DVT,   Pulmonary: [ ]  Home oxygen, [ ]  Productive cough, [ ]  Coughing up blood, [ ]  Asthma,  [ ]  Wheezing Musculoskeletal:  [ ]  Arthritis, [ ]  Low back pain, [ ]  Joint pain Hematologic: [ ]  Easy Bruising, [ ]  Anemia; [ ]  Hepatitis Gastrointestinal: [ ]  Blood in stool, [ ]  Gastroesophageal Reflux/heartburn, [ ]  Trouble swallowing Urinary: [ ]  chronic Kidney disease, [ ]  on HD - [ ]  MWF or [ ]  TTHS, [ ]  Burning  with urination, [ ]  Difficulty urinating Skin: [ ]  Rashes, [ ]  Wounds Psychological: [ ]  Anxiety, [ ]  Depression   Social History History  Substance Use Topics  . Smoking status: Former Smoker    Types: Cigarettes  . Smokeless tobacco: Never Used   Comment: "stopped smoking cigarettes 1969"  . Alcohol Use: 7.2 oz/week    12 Cans of beer per week    Family History Family History  Problem Relation Age of Onset  . Heart disease Father     Heart Disease before age 63  . Heart attack Father   . Stroke Mother   . Arthritis Mother   . Hypertension Mother   . Diabetes Maternal Aunt   . Diabetes Brother     Allergies  Allergen Reactions  . Morphine And Related     Causes confusion    Current Outpatient Prescriptions  Medication Sig Dispense Refill  . amLODipine (NORVASC) 5 MG tablet Take 5 mg by mouth daily.      Marland Kitchen aspirin 81 MG tablet Take 81 mg by mouth daily.        . Atorvastatin Calcium (LIPITOR PO) Take 10 mg by mouth daily.       Marland Kitchen  buPROPion (WELLBUTRIN) 100 MG tablet Take 100 mg by mouth daily.        . Coenzyme Q10 (CO Q 10) 100 MG CAPS Take 1 capsule by mouth daily.        Marland Kitchen escitalopram (LEXAPRO) 10 MG tablet Take 10 mg by mouth daily.        . fish oil-omega-3 fatty acids 1000 MG capsule Take 1 g by mouth daily.      Marland Kitchen lisinopril (PRINIVIL,ZESTRIL) 5 MG tablet as needed.      . Multiple Vitamins-Minerals (MULTIVITAMINS THER. W/MINERALS) TABS Take 1 tablet by mouth daily.        . SAW PALMETTO, SERENOA REPENS, PO Take 3 tablets by mouth daily.        . sodium chloride (OCEAN) 0.65 % nasal spray Place 2 sprays into the nose daily as needed. For dryness       . triamcinolone (KENALOG) 0.025 % cream Apply 1 application topically daily as needed. For dryness      . CRESTOR 20 MG tablet Take 20 mg by mouth Daily.        Physical Examination  Filed Vitals:   07/19/12 1420  BP: 150/73  Pulse: 71  Resp:     Body mass index is 26.52 kg/(m^2).  General:  WDWN in  NAD Gait: Normal HEENT: WNL Eyes: Pupils equal Pulmonary: normal non-labored breathing , without Rales, rhonchi,  wheezing Cardiac: RRR, without  Murmurs, rubs or gallops; Abdomen: soft, NT, no masses Skin: no rashes, ulcers noted  Vascular Exam Pulses: 3+ radial pulses bilaterally Carotid bruits: Carotid pulses to auscultation no bruits are heard Extremities without ischemic changes, no Gangrene , no cellulitis; no open wounds;  Musculoskeletal: no muscle wasting or atrophy   Neurologic: A&O X 3; Appropriate Affect ; SENSATION: normal; MOTOR FUNCTION:  moving all extremities equally. Speech is fluent/normal  Non-Invasive Vascular Imaging CAROTID DUPLEX 07/19/2012  Right ICA 40 - 59 % stenosis Left ICA 0 - 19% stenosis   ASSESSMENT/PLAN: Asymptomatic patient with mild right ICA stenosis and a patent left ICA, the patient will followup in one year with repeat carotid duplex. The patient's questions were encouraged and answered, he is in agreement with this plan.  Lauree Chandler ANP   Clinic MD: Early

## 2012-07-26 ENCOUNTER — Emergency Department (HOSPITAL_COMMUNITY)
Admission: EM | Admit: 2012-07-26 | Discharge: 2012-07-26 | Disposition: A | Discharge status: Home / Self Care | Payer: Federal, State, Local not specified - PPO | Source: Home / Self Care | Attending: Family Medicine | Admitting: Family Medicine

## 2012-07-26 ENCOUNTER — Encounter (HOSPITAL_COMMUNITY): Payer: Self-pay | Admitting: Emergency Medicine

## 2012-07-26 DIAGNOSIS — W19XXXA Unspecified fall, initial encounter: Secondary | ICD-10-CM

## 2012-07-26 DIAGNOSIS — S0190XA Unspecified open wound of unspecified part of head, initial encounter: Secondary | ICD-10-CM

## 2012-07-26 DIAGNOSIS — S0191XA Laceration without foreign body of unspecified part of head, initial encounter: Secondary | ICD-10-CM

## 2012-07-26 NOTE — ED Notes (Signed)
Reports fall around 9:00 a.m. Slipped on rug in hallway.  Reports lower back pain, laceration to back of head, hematoma, dried blood.  Denies loc. No one with patient when he fell

## 2012-07-26 NOTE — ED Notes (Signed)
Wrapped head with kerlix and provided ice pack.

## 2012-07-26 NOTE — ED Notes (Signed)
Patient reported tetanus 3 years ago.

## 2012-07-26 NOTE — ED Notes (Signed)
Instructed 2 gentlemen with patient if patient seems confused, n/v, off balance, any fluid from ears or nose -instructed to call 911 immediately.

## 2012-07-26 NOTE — ED Provider Notes (Signed)
History     CSN: [id number]  Arrival date & time 07/26/12  1059   None     Chief Complaint  Patient presents with  . Fall    (Consider location/radiation/quality/duration/timing/severity/associated sxs/prior treatment) Patient is a 78 y.o. male presenting with fall. The history is provided by the patient, a relative and a friend.  Fall The accident occurred 3 to 5 hours ago. The fall occurred while walking. Distance fallen: fall from standing position, tripped over throw rug. He landed on a hard floor. The volume of blood lost was minimal. The point of impact was the head. The pain is present in the head. The pain is mild. He was ambulatory at the scene. There was no entrapment after the fall. There was no drug use involved in the accident. There was no alcohol use involved in the accident. Associated symptoms include headaches and hearing loss. Pertinent negatives include no visual change, no fever, no numbness, no nausea, no vomiting and no loss of consciousness. Associated symptoms comments: Hx of hearing loss. The symptoms are aggravated by pressure on the injury. Prehospitalization: none. He has tried nothing for the symptoms.  last tetanus 3 years ago.  Past Medical History  Diagnosis Date  . Hypertension   . Hyperlipidemia   . Carotid artery occlusion   . AAA (abdominal aortic aneurysm)   . Valvular heart disease   . Hyperlipidemia   . Cancer     "skin cancer removed from top of head"  . Arthritis     "in his spine"  . Refusal of blood transfusions as patient is Jehovah's Witness 08/31/11  . PTSD (post-traumatic stress disorder)   . Depression     "terrible; from the PTSD"  . Anxiety   . Nightmares   . Spinal stenosis   . Memory loss   . Dementia, vascular Oct. 2013  . Stroke     X three    Past Surgical History  Procedure Date  . Wrist surgery 2003    left; S/P fracture; "put a plate in it"  . Abdominal aortic aneurysm repair 2002  . Carotid endarterectomy  2004    left  . Total knee arthroplasty 2000's    bilaterally  . Cataract extraction w/ intraocular lens  implant, bilateral ~ 2010  . Joint replacement 2003 and 2004    Right and left knee    Family History  Problem Relation Age of Onset  . Heart disease Father     Heart Disease before age 72  . Heart attack Father   . Stroke Mother   . Arthritis Mother   . Hypertension Mother   . Diabetes Maternal Aunt   . Diabetes Brother     History  Substance Use Topics  . Smoking status: Former Smoker    Types: Cigarettes  . Smokeless tobacco: Never Used   Comment: "stopped smoking cigarettes 1969"  . Alcohol Use: No      Review of Systems  Constitutional: Negative.  Negative for fever.  Respiratory: Negative.   Cardiovascular: Negative.   Gastrointestinal: Negative for nausea and vomiting.  Skin: Positive for wound.  Neurological: Positive for headaches. Negative for loss of consciousness and numbness.  All other systems reviewed and are negative.    Allergies  Morphine and related  Home Medications   Current Outpatient Rx  Name Route Sig Dispense Refill  . AMLODIPINE BESYLATE 5 MG PO TABS Oral Take 5 mg by mouth daily.    . ASPIRIN 81 MG PO  TABS Oral Take 81 mg by mouth daily.      Marland Kitchen LIPITOR PO Oral Take 10 mg by mouth daily.     . BUPROPION HCL 100 MG PO TABS Oral Take 100 mg by mouth daily.      . CO Q 10 100 MG PO CAPS Oral Take 1 capsule by mouth daily.      . CRESTOR 20 MG PO TABS Oral Take 20 mg by mouth Daily.    Marland Kitchen ESCITALOPRAM OXALATE 10 MG PO TABS Oral Take 10 mg by mouth daily.      . OMEGA-3 FATTY ACIDS 1000 MG PO CAPS Oral Take 1 g by mouth daily.    Marland Kitchen LISINOPRIL 5 MG PO TABS  as needed.    Carma Leaven M PLUS PO TABS Oral Take 1 tablet by mouth daily.      . SAW PALMETTO (SERENOA REPENS) PO Oral Take 3 tablets by mouth daily.      Marland Kitchen SALINE NASAL SPRAY 0.65 % NA SOLN Nasal Place 2 sprays into the nose daily as needed. For dryness     . TRIAMCINOLONE  ACETONIDE 0.025 % EX CREA Topical Apply 1 application topically daily as needed. For dryness      BP 125/69  Pulse 63  Temp 98.9 F (37.2 C) (Oral)  Resp 20  SpO2 95%  Physical Exam  Nursing note and vitals reviewed. Constitutional: He is oriented to person, place, and time. Vital signs are normal. He appears well-developed and well-nourished. He is active and cooperative.  HENT:  Head: Normocephalic. Head is with laceration. Head is without raccoon's eyes, without Battle's sign, without right periorbital erythema and without left periorbital erythema.    Right Ear: Tympanic membrane, external ear and ear canal normal.  Left Ear: Tympanic membrane, external ear and ear canal normal.  Nose: Nose normal. No rhinorrhea.  Mouth/Throat: Uvula is midline, oropharynx is clear and moist and mucous membranes are normal.  Eyes: Conjunctivae normal and EOM are normal. Pupils are equal, round, and reactive to light. No scleral icterus.  Neck: Trachea normal, normal range of motion and full passive range of motion without pain. Neck supple.  Cardiovascular: Normal rate, regular rhythm, normal heart sounds, intact distal pulses and normal pulses.   Pulmonary/Chest: Effort normal and breath sounds normal.  Musculoskeletal: Normal range of motion.  Neurological: He is alert and oriented to person, place, and time. He has normal strength. No cranial nerve deficit or sensory deficit. Coordination normal. GCS eye subscore is 4. GCS verbal subscore is 5. GCS motor subscore is 6.  Skin: Skin is warm and dry. Laceration noted.  Psychiatric: He has a normal mood and affect. His speech is normal and behavior is normal. Judgment and thought content normal. Cognition and memory are normal.    ED Course  LACERATION REPAIR Date/Time: 07/26/2012 1:45 PM Performed by: Nigel Mormon Emerlyn Mehlhoff L Authorized by: Johnsie Kindred Consent: Verbal consent obtained. Risks and benefits: risks, benefits and alternatives were  discussed Consent given by: patient Patient understanding: patient states understanding of the procedure being performed Patient consent: the patient's understanding of the procedure matches consent given Procedure consent: procedure consent matches procedure scheduled Patient identity confirmed: verbally with patient and arm band Time out: Immediately prior to procedure a "time out" was called to verify the correct patient, procedure, equipment, support staff and site/side marked as required. Body area: head/neck Location details: scalp Laceration length: 4 cm Foreign bodies: no foreign bodies Tendon involvement: none Nerve involvement:  none Vascular damage: no Anesthesia method: none. Patient sedated: no Preparation: Patient was prepped and draped in the usual sterile fashion. Irrigation solution: saline Irrigation method: syringe Amount of cleaning: standard Skin closure: staples Number of sutures: 4 (staples) Approximation: close Approximation difficulty: simple Dressing: antibiotic ointment and 4x4 sterile gauze Patient tolerance: Patient tolerated the procedure well with no immediate complications.   (including critical care time)  Labs Reviewed - No data to display No results found.   1. Laceration of head   2. Fall       MDM  Patient stable.  Monitor for symptoms that are of concern related to head trauma as discussed, information provided.  rtc in 10 days for staple removal.  Fall prevention discussed.        Johnsie Kindred, NP 07/26/12 1351  Johnsie Kindred, NP 07/26/12 1414

## 2012-07-26 NOTE — ED Notes (Signed)
Called to evaluate this pt. Pt fell earlier this AM, striking the back of his head. Jagged lac to posterior scalp noted - bleeding controlled. Pt reports he tripped and that he thinks he hit his head against a door jam. Pt denies LOC, prior to or after the fall - states he remembers events leading up to and after the fall. Pt also reports back pain, but ambulatory w/ his cane, as per his normal. Caregiver w/ patient states pt appears to be and is acting per his normal. Pt & caregiver instructed to inform staff if anything changed in the pt's condition/complaint prior to being called to the treatment area. They verbalized their understanding of this instruction.

## 2012-07-27 NOTE — ED Provider Notes (Signed)
Medical screening examination/treatment/procedure(s) were performed by resident physician or non-physician practitioner and as supervising physician I was immediately available for consultation/collaboration.   Azariyah Luhrs DOUGLAS MD.    Noeli Lavery D Elgene Coral, MD 07/27/12 1834 

## 2012-08-02 ENCOUNTER — Emergency Department (HOSPITAL_COMMUNITY): Payer: Federal, State, Local not specified - PPO

## 2012-08-02 ENCOUNTER — Emergency Department (HOSPITAL_COMMUNITY)
Admission: EM | Admit: 2012-08-02 | Discharge: 2012-08-02 | Disposition: A | Discharge status: Home / Self Care | Payer: Federal, State, Local not specified - PPO | Attending: Emergency Medicine | Admitting: Emergency Medicine

## 2012-08-02 ENCOUNTER — Encounter (HOSPITAL_COMMUNITY): Payer: Self-pay

## 2012-08-02 DIAGNOSIS — E785 Hyperlipidemia, unspecified: Secondary | ICD-10-CM | POA: Insufficient documentation

## 2012-08-02 DIAGNOSIS — F411 Generalized anxiety disorder: Secondary | ICD-10-CM | POA: Insufficient documentation

## 2012-08-02 DIAGNOSIS — I10 Essential (primary) hypertension: Secondary | ICD-10-CM | POA: Insufficient documentation

## 2012-08-02 DIAGNOSIS — Z87891 Personal history of nicotine dependence: Secondary | ICD-10-CM | POA: Insufficient documentation

## 2012-08-02 DIAGNOSIS — Z7982 Long term (current) use of aspirin: Secondary | ICD-10-CM | POA: Insufficient documentation

## 2012-08-02 DIAGNOSIS — F329 Major depressive disorder, single episode, unspecified: Secondary | ICD-10-CM | POA: Insufficient documentation

## 2012-08-02 DIAGNOSIS — Z8673 Personal history of transient ischemic attack (TIA), and cerebral infarction without residual deficits: Secondary | ICD-10-CM | POA: Insufficient documentation

## 2012-08-02 DIAGNOSIS — W010XXA Fall on same level from slipping, tripping and stumbling without subsequent striking against object, initial encounter: Secondary | ICD-10-CM | POA: Insufficient documentation

## 2012-08-02 DIAGNOSIS — Z85828 Personal history of other malignant neoplasm of skin: Secondary | ICD-10-CM | POA: Insufficient documentation

## 2012-08-02 DIAGNOSIS — S42209A Unspecified fracture of upper end of unspecified humerus, initial encounter for closed fracture: Secondary | ICD-10-CM | POA: Insufficient documentation

## 2012-08-02 DIAGNOSIS — M4800 Spinal stenosis, site unspecified: Secondary | ICD-10-CM | POA: Insufficient documentation

## 2012-08-02 DIAGNOSIS — S42202A Unspecified fracture of upper end of left humerus, initial encounter for closed fracture: Secondary | ICD-10-CM

## 2012-08-02 DIAGNOSIS — R413 Other amnesia: Secondary | ICD-10-CM | POA: Insufficient documentation

## 2012-08-02 DIAGNOSIS — Z8679 Personal history of other diseases of the circulatory system: Secondary | ICD-10-CM | POA: Insufficient documentation

## 2012-08-02 DIAGNOSIS — Z79899 Other long term (current) drug therapy: Secondary | ICD-10-CM | POA: Insufficient documentation

## 2012-08-02 DIAGNOSIS — F3289 Other specified depressive episodes: Secondary | ICD-10-CM | POA: Insufficient documentation

## 2012-08-02 DIAGNOSIS — Y939 Activity, unspecified: Secondary | ICD-10-CM | POA: Insufficient documentation

## 2012-08-02 DIAGNOSIS — F0150 Vascular dementia without behavioral disturbance: Secondary | ICD-10-CM | POA: Insufficient documentation

## 2012-08-02 DIAGNOSIS — Y929 Unspecified place or not applicable: Secondary | ICD-10-CM | POA: Insufficient documentation

## 2012-08-02 LAB — COMPREHENSIVE METABOLIC PANEL
ALT: 16 U/L (ref 0–53)
CO2: 30 mEq/L (ref 19–32)
Calcium: 9.5 mg/dL (ref 8.4–10.5)
Chloride: 102 mEq/L (ref 96–112)
Creatinine, Ser: 1.24 mg/dL (ref 0.50–1.35)
GFR calc Af Amer: 58 mL/min — ABNORMAL LOW (ref >90)
GFR calc non Af Amer: 50 mL/min — ABNORMAL LOW (ref >90)
Glucose, Bld: 117 mg/dL — ABNORMAL HIGH (ref 70–99)
Sodium: 139 mEq/L (ref 135–145)
Total Bilirubin: 0.4 mg/dL (ref 0.3–1.2)

## 2012-08-02 LAB — CBC
Hemoglobin: 13.6 g/dL (ref 13.0–17.0)
MCH: 28.7 pg (ref 26.0–34.0)
MCV: 86.5 fL (ref 78.0–100.0)
RBC: 4.74 MIL/uL (ref 4.22–5.81)

## 2012-08-02 MED ORDER — HYDROCODONE-ACETAMINOPHEN 5-325 MG PO TABS: 1.0000 | ORAL_TABLET | Freq: Four times a day (QID) | ORAL | Status: DC | PRN

## 2012-08-02 NOTE — Progress Notes (Signed)
[facility] ED CM left a voice message for [name] [name] ([phone] and kyla gratton [phone]), embedded case managers for [facility] to inquire about [facility] (wife wanting to know) Return call from [place] to provided contact information for [facility] cm as kevin donnelly primary sw [phone] [facility] ED CM left message for [name] to contact CM or pt/wife at Cm number or pt home number for assist with preferred home health agency and snf Pending return call

## 2012-08-02 NOTE — ED Notes (Signed)
Per EMS: Pt from home. Pt tripped and fell on L elbow. Deformity to shoulder. Shoulder immobilized.

## 2012-08-02 NOTE — Clinical Social Work Note (Signed)
CSW spoke to pt wife, Gunnar Fusi re: placement for pt.  Wife is unsure whether she is wanting  SNF or Home Health.  CSW gave pt wife SNF list and advised wife she could f/u with pt physician for placement if SNF is the route she wanted to pursue.    CSW contacted Selena Batten in case management re: pt wife request for information on home health for pt.  Wife is unsure which route would be more beneficial for pt.  CSW recommended SNF due to pt dementia and hx of falls.  Pt wife unsure.  RNCM to follow up with pt. Vickii Penna, LCSWA [phone] Clinical Social Work

## 2012-08-02 NOTE — Progress Notes (Signed)
[facility] ED CM spoke with Rich Fuchs home health coordinator about possible referral and whether [facility] is a preferred home health agency for [place].  Debbie given pt name to follow Eunice Blase states [facility] but the [place] generally sets up services for their own patients

## 2012-08-02 NOTE — ED Provider Notes (Addendum)
History     CSN: [id number]  Arrival date & time 08/02/12  0255   First MD Initiated Contact with Patient 08/02/12 [phone]      Chief Complaint  Patient presents with  . Fall    (Consider location/radiation/quality/duration/timing/severity/associated sxs/prior treatment) HPI 78 year old male presents to emergency apartment after a fall. Patient had a chemical fall, tripping and falling and landing on his left shoulder. Patient has history of dementia. Wife reports that he falls often. He recently had the staples out of his head for another fall. He has had previous skin tear to his left elbow that he is really opened with this fall. Patient denies any headache. There is no head trauma with this fall. He is complaining of left shoulder pain.  Past Medical History  Diagnosis Date  . Hypertension   . Hyperlipidemia   . Carotid artery occlusion   . AAA (abdominal aortic aneurysm)   . Valvular heart disease   . Hyperlipidemia   . Cancer     "skin cancer removed from top of head"  . Arthritis     "in his spine"  . Refusal of blood transfusions as patient is Jehovah's Witness 08/31/11  . PTSD (post-traumatic stress disorder)   . Depression     "terrible; from the PTSD"  . Anxiety   . Nightmares   . Spinal stenosis   . Memory loss   . Dementia, vascular Oct. 2013  . Stroke     X three    Past Surgical History  Procedure Date  . Wrist surgery 2003    left; S/P fracture; "put a plate in it"  . Abdominal aortic aneurysm repair 2002  . Carotid endarterectomy 2004    left  . Total knee arthroplasty 2000's    bilaterally  . Cataract extraction w/ intraocular lens  implant, bilateral ~ 2010  . Joint replacement 2003 and 2004    Right and left knee    Family History  Problem Relation Age of Onset  . Heart disease Father     Heart Disease before age 14  . Heart attack Father   . Stroke Mother   . Arthritis Mother   . Hypertension Mother   . Diabetes Maternal Aunt   .  Diabetes Brother     History  Substance Use Topics  . Smoking status: Former Smoker    Types: Cigarettes  . Smokeless tobacco: Never Used   Comment: "stopped smoking cigarettes 1969"  . Alcohol Use: No      Review of Systems  Unable to perform ROS: Dementia    Allergies  Morphine and related  Home Medications   Current Outpatient Rx  Name Route Sig Dispense Refill  . AMLODIPINE BESYLATE 5 MG PO TABS Oral Take 5 mg by mouth daily.    . ASPIRIN 81 MG PO TABS Oral Take 81 mg by mouth daily.      . ATORVASTATIN CALCIUM 80 MG PO TABS Oral Take 40 mg by mouth daily.    . BUPROPION HCL 100 MG PO TABS Oral Take 100 mg by mouth daily.      . CO Q 10 100 MG PO CAPS Oral Take 1 capsule by mouth daily.      . DONEPEZIL HCL 10 MG PO TABS Oral Take 5 mg by mouth daily.    Marland Kitchen ESCITALOPRAM OXALATE 20 MG PO TABS Oral Take 20 mg by mouth daily.    . OMEGA-3 FATTY ACIDS 1000 MG PO CAPS Oral Take 1  g by mouth daily.    Marland Kitchen HYDROCODONE-ACETAMINOPHEN 5-325 MG PO TABS Oral Take 1-2 tablets by mouth every 6 (six) hours as needed. For pain    . THERA M PLUS PO TABS Oral Take 1 tablet by mouth daily.      . SAW PALMETTO (SERENOA REPENS) PO Oral Take 3 tablets by mouth daily.      Marland Kitchen LISINOPRIL 5 MG PO TABS Oral Take 5 mg by mouth daily as needed. For blood pressure greater then 150    . SALINE NASAL SPRAY 0.65 % NA SOLN Nasal Place 2 sprays into the nose daily as needed. For dryness     . TRIAMCINOLONE ACETONIDE 0.025 % EX CREA Topical Apply 1 application topically daily as needed. For dryness      BP 177/86  Pulse 68  Temp 97.6 F (36.4 C) (Oral)  Resp 16  SpO2 92%  Physical Exam  Nursing note and vitals reviewed. Constitutional: He appears well-developed and well-nourished. No distress.  HENT:  Head: Normocephalic and atraumatic.  Nose: Nose normal.  Mouth/Throat: Oropharynx is clear and moist.  Eyes: Conjunctivae normal and EOM are normal. Pupils are equal, round, and reactive to light.    Neck: Normal range of motion. Neck supple. No JVD present. No tracheal deviation present. No thyromegaly present.  Cardiovascular: Normal rate, regular rhythm, normal heart sounds and intact distal pulses.  Exam reveals no gallop and no friction rub.   No murmur heard. Pulmonary/Chest: Effort normal and breath sounds normal. No stridor. No respiratory distress. He has no wheezes. He has no rales. He exhibits no tenderness.  Abdominal: Soft. Bowel sounds are normal. He exhibits no distension and no mass. There is no tenderness. There is no rebound and no guarding.  Musculoskeletal: He exhibits tenderness. He exhibits no edema.       Deformity noted to left shoulder. Pain with any attempt at range of motion. Distally neurovascularly intact. He has skin tear to the lateral aspect of his elbow with bleeding controlled  Lymphadenopathy:    He has no cervical adenopathy.  Neurological: He exhibits normal muscle tone. Coordination normal.  Skin: Skin is warm and dry. No rash noted. No erythema. No pallor.    ED Course  Procedures (including critical care time)  Labs Reviewed - No data to display Dg Shoulder Left  08/02/2012  *RADIOLOGY REPORT*  Clinical Data: Status post fall.  Pain.  LEFT SHOULDER - 2+ VIEW  Comparison: None.  Findings: There is an impacted surgical neck fracture of the left humerus.  The humeral head appears located and the acromioclavicular joint is intact with degenerative change noted. Imaged left lung and ribs are unremarkable. Please note that positioning is nonstandard.  IMPRESSION: Impacted surgical neck fracture left humerus.   Original Report Authenticated By: Bernadene Bell. D'ALESSIO, M.D.      1. Fracture of humerus, proximal, left, closed       MDM  78 year old male with proximal humerus fracture. Will place in sling and swath, and have him follow up with [facility] orthopedics who is his regular orthopedic group. Wife updated on plan. We'll dress skin tear to his  left elbow prior to placement of the sling.        Olivia Mackie, MD 08/02/12 [phone]  5:08 AM Wife reports she is unable to care for the patient home due to the fact that he normally uses a walker to get around with and they have multiple stairs in the home. We'll cancel the  discharge, have social work see the patient in the morning and work on placement.  Olivia Mackie, MD 08/02/12 [phone]

## 2012-08-02 NOTE — ED Notes (Signed)
ZOX:WRUE<AV> Expected date:08/02/12<BR> Expected time: 2:42 AM<BR> Means of arrival:Ambulance<BR> Comments:<BR> Fall; possible shoulder dislocation

## 2012-08-02 NOTE — Progress Notes (Signed)
Pt noted to have used [place] for services on 09/02/12 d/c date from [facility] for home health services

## 2012-08-02 NOTE — Progress Notes (Signed)
[facility] ED CM spoke with Inetta Fermo SW at [facility] clinic who requested ED progress notes , face sheet and orders for home health CM faxed information to [phone] ATTN Currie Paris with return confirmation notice at 1530 08/02/12.  CM informed [facility] was last home health agency used by pt. Inetta Fermo voiced concern about pt "feet" after Speaking the wife Cm reviewed EDP notes and there was not a complaint about pt feet nor mobility Cm recalls mobility concerns being only voiced by wife when ems staff attempted to get pt from w/c and wife stating pt moving feet in bed. Inetta Fermo was informed of this CM received a voice message from [place], [place] SW Cm returned a call and left Caryn Bee a Voice message at [phone] that Cm spoke with tina and faxed clinical information Inetta Fermo states [facility] pcp is [facility] Cm called and updated the wife about calls to tina, faxed clinicals and message from [place]. Wife states pt resting and is doing well CM will follow up on 08/02/12 to see if all information needed received by [facility] staff

## 2012-08-02 NOTE — ED Notes (Signed)
Patient is aware that he needs a urine sample.

## 2012-08-02 NOTE — Progress Notes (Signed)
[facility] ED Cm consulted by ED SW for home health service information/questions of wife CM spoke with pt's wife about home health She is aware pt not meeting inpatient admission status and is willing to return home with home health services (she previously worked in Administrator and SW professions). Wife discussed wanting pt to go to snf States pt was previously in [place] and preference is [employer] or [facility].  Confirmed pcp as [facility].  Prefers to used last home health agency recommended by Dr Berton Lan after pt knee surgery.  Wife wants [facility] consulted for list of their preferred home health services and snfs Reports pt seen in [place] but is connected with [facility].  Provided her cell number as [phone] for further contact Discussed pt being transferred back home via ambulance.

## 2012-08-02 NOTE — ED Notes (Signed)
Waiting on SW to come see pt.  Plan is for pt to go to [place].

## 2012-08-02 NOTE — ED Provider Notes (Signed)
Signed out by Dr Norlene Campbell than SW eval pending.  Discussed pt with SW and care Production designer, theatre/television/film.  They indicate pts spouse agreeable with patient going home with home health referral. Referrals made for home nurse, PT, aide, and sw eval re possible future ecf placement.   Recheck pt, alert, content, comfortable.   Suzi Roots, MD 08/02/12 1139

## 2012-08-02 NOTE — Progress Notes (Signed)
Correction tina number is [id number] [phone] Spoke with Inetta Fermo

## 2012-08-02 NOTE — Progress Notes (Signed)
[facility] Cm called to updated wife She reports she has spoken to [place] at [facility] and left a voice message for Caryn Bee Wife provided contact information for Inetta Fermo as [phone] Wife had a call from [place] to review the independent living facility today Wife states when transferred home via ambulance the ems staff used wheelchair to assist pt in his bedroom Pt reported to have difficulty with moving legs but wife noticed he moves feet in bed.  Pt has a bedside commode and urinal to use at home Daughter lives in [place] and wife will be having daughter to assist her

## 2012-08-03 NOTE — Progress Notes (Signed)
ED CM received a voice message from wife stating pt needing to go to snf. CM returned call pt reports pt still not walking and has been incontinent CM allowed wife time to ventilate her feelings about pt's recent ED visit, him not walking and wanting to get pt to [place] snf. Wife spoke with male [place] SW who informed her to bring pt to [place] clinic for evaluation for snf.  Wife voiced concern about getting pt to [place] clinic for 9 am 08/04/12 appointment CM spoke with ED charge RN about non emergent ambulance transport Referred to [phone] CM spoke with Britt Boozer at [phone] prompt #3 who put in a delay transport request for pt to be picked up on 1031/13 am to be taken to [facility] clinic CM returned call to wife to inform her of [facility] transport request completion and provided her with [phone] to contact [facility] if questions, need to cancel appointment and other concerns.  Pt appreciative of service provided and voiced understanding

## 2012-08-04 ENCOUNTER — Emergency Department (HOSPITAL_COMMUNITY): Payer: Federal, State, Local not specified - PPO

## 2012-08-04 ENCOUNTER — Observation Stay (HOSPITAL_COMMUNITY)
Admission: EM | Admit: 2012-08-04 | Discharge: 2012-08-05 | Disposition: A | Discharge status: Other Acute Inpatient Hospital | Payer: Federal, State, Local not specified - PPO | Attending: Internal Medicine | Admitting: Internal Medicine

## 2012-08-04 ENCOUNTER — Encounter (HOSPITAL_COMMUNITY): Payer: Self-pay

## 2012-08-04 DIAGNOSIS — E860 Dehydration: Secondary | ICD-10-CM | POA: Insufficient documentation

## 2012-08-04 DIAGNOSIS — S0093XA Contusion of unspecified part of head, initial encounter: Secondary | ICD-10-CM

## 2012-08-04 DIAGNOSIS — F0390 Unspecified dementia without behavioral disturbance: Secondary | ICD-10-CM

## 2012-08-04 DIAGNOSIS — R4689 Other symptoms and signs involving appearance and behavior: Secondary | ICD-10-CM

## 2012-08-04 DIAGNOSIS — I517 Cardiomegaly: Secondary | ICD-10-CM | POA: Insufficient documentation

## 2012-08-04 DIAGNOSIS — F603 Borderline personality disorder: Secondary | ICD-10-CM | POA: Insufficient documentation

## 2012-08-04 DIAGNOSIS — S42213A Unspecified displaced fracture of surgical neck of unspecified humerus, initial encounter for closed fracture: Principal | ICD-10-CM | POA: Insufficient documentation

## 2012-08-04 DIAGNOSIS — I6529 Occlusion and stenosis of unspecified carotid artery: Secondary | ICD-10-CM

## 2012-08-04 DIAGNOSIS — F4310 Post-traumatic stress disorder, unspecified: Secondary | ICD-10-CM | POA: Insufficient documentation

## 2012-08-04 DIAGNOSIS — E785 Hyperlipidemia, unspecified: Secondary | ICD-10-CM

## 2012-08-04 DIAGNOSIS — Z9181 History of falling: Secondary | ICD-10-CM

## 2012-08-04 DIAGNOSIS — F329 Major depressive disorder, single episode, unspecified: Secondary | ICD-10-CM | POA: Insufficient documentation

## 2012-08-04 DIAGNOSIS — I700 Atherosclerosis of aorta: Secondary | ICD-10-CM | POA: Insufficient documentation

## 2012-08-04 DIAGNOSIS — R296 Repeated falls: Secondary | ICD-10-CM | POA: Insufficient documentation

## 2012-08-04 DIAGNOSIS — S42302A Unspecified fracture of shaft of humerus, left arm, initial encounter for closed fracture: Secondary | ICD-10-CM

## 2012-08-04 DIAGNOSIS — F3289 Other specified depressive episodes: Secondary | ICD-10-CM | POA: Insufficient documentation

## 2012-08-04 DIAGNOSIS — S42292A Other displaced fracture of upper end of left humerus, initial encounter for closed fracture: Secondary | ICD-10-CM

## 2012-08-04 DIAGNOSIS — R32 Unspecified urinary incontinence: Secondary | ICD-10-CM

## 2012-08-04 DIAGNOSIS — F0391 Unspecified dementia with behavioral disturbance: Secondary | ICD-10-CM | POA: Diagnosis present

## 2012-08-04 DIAGNOSIS — R531 Weakness: Secondary | ICD-10-CM

## 2012-08-04 DIAGNOSIS — Z8673 Personal history of transient ischemic attack (TIA), and cerebral infarction without residual deficits: Secondary | ICD-10-CM | POA: Insufficient documentation

## 2012-08-04 DIAGNOSIS — F03918 Unspecified dementia, unspecified severity, with other behavioral disturbance: Secondary | ICD-10-CM

## 2012-08-04 DIAGNOSIS — I10 Essential (primary) hypertension: Secondary | ICD-10-CM

## 2012-08-04 DIAGNOSIS — Z79899 Other long term (current) drug therapy: Secondary | ICD-10-CM | POA: Insufficient documentation

## 2012-08-04 LAB — URINALYSIS, MICROSCOPIC ONLY
Glucose, UA: NEGATIVE mg/dL
Hgb urine dipstick: NEGATIVE
Ketones, ur: NEGATIVE mg/dL
Leukocytes, UA: NEGATIVE
Protein, ur: NEGATIVE mg/dL
pH: 6.5 (ref 5.0–8.0)

## 2012-08-04 LAB — CBC WITH DIFFERENTIAL/PLATELET
Basophils Absolute: 0 10*3/uL (ref 0.0–0.1)
Basophils Relative: 0 % (ref 0–1)
Eosinophils Absolute: 0.4 10*3/uL (ref 0.0–0.7)
Hemoglobin: 12.8 g/dL — ABNORMAL LOW (ref 13.0–17.0)
MCHC: 32.5 g/dL (ref 30.0–36.0)
Monocytes Relative: 10 % (ref 3–12)
Neutro Abs: 6.3 10*3/uL (ref 1.7–7.7)
Neutrophils Relative %: 73 % (ref 43–77)
Platelets: 157 10*3/uL (ref 150–400)

## 2012-08-04 LAB — COMPREHENSIVE METABOLIC PANEL
ALT: 13 U/L (ref 0–53)
AST: 22 U/L (ref 0–37)
Albumin: 3.2 g/dL — ABNORMAL LOW (ref 3.5–5.2)
Alkaline Phosphatase: 74 U/L (ref 39–117)
BUN: 30 mg/dL — ABNORMAL HIGH (ref 6–23)
Chloride: 102 mEq/L (ref 96–112)
Potassium: 3.9 mEq/L (ref 3.5–5.1)
Sodium: 140 mEq/L (ref 135–145)
Total Bilirubin: 0.5 mg/dL (ref 0.3–1.2)
Total Protein: 6.3 g/dL (ref 6.0–8.3)

## 2012-08-04 MED ORDER — ADULT MULTIVITAMIN W/MINERALS CH
1.0000 | ORAL_TABLET | Freq: Every day | ORAL | Status: DC
  Administered 2012-08-04: 1 via ORAL
  Filled 2012-08-04 (×2): qty 1

## 2012-08-04 MED ORDER — ESCITALOPRAM OXALATE 20 MG PO TABS
20.0000 mg | ORAL_TABLET | Freq: Every day | ORAL | Status: DC
  Administered 2012-08-04: 20 mg via ORAL
  Filled 2012-08-04 (×2): qty 1

## 2012-08-04 MED ORDER — AMLODIPINE BESYLATE 5 MG PO TABS
5.0000 mg | ORAL_TABLET | Freq: Every day | ORAL | Status: DC
  Administered 2012-08-04: 5 mg via ORAL
  Filled 2012-08-04 (×2): qty 1

## 2012-08-04 MED ORDER — LISINOPRIL 5 MG PO TABS
5.0000 mg | ORAL_TABLET | Freq: Every day | ORAL | Status: DC
  Administered 2012-08-04: 5 mg via ORAL
  Filled 2012-08-04 (×2): qty 1

## 2012-08-04 MED ORDER — OMEGA-3-ACID ETHYL ESTERS 1 G PO CAPS
1.0000 g | ORAL_CAPSULE | Freq: Every day | ORAL | Status: DC
  Administered 2012-08-04: 1 g via ORAL
  Filled 2012-08-04 (×2): qty 1

## 2012-08-04 MED ORDER — ATORVASTATIN CALCIUM 40 MG PO TABS
40.0000 mg | ORAL_TABLET | Freq: Every day | ORAL | Status: DC
  Administered 2012-08-04: 40 mg via ORAL
  Filled 2012-08-04 (×2): qty 1

## 2012-08-04 MED ORDER — ASPIRIN EC 81 MG PO TBEC
81.0000 mg | DELAYED_RELEASE_TABLET | Freq: Every day | ORAL | Status: DC
Start: 2012-08-04 — End: 2012-08-05
  Administered 2012-08-04: 81 mg via ORAL
  Filled 2012-08-04 (×2): qty 1

## 2012-08-04 MED ORDER — DONEPEZIL HCL 5 MG PO TABS
5.0000 mg | ORAL_TABLET | Freq: Every day | ORAL | Status: DC
  Administered 2012-08-04: 5 mg via ORAL
  Filled 2012-08-04 (×2): qty 1

## 2012-08-04 MED ORDER — TRIAMCINOLONE ACETONIDE 0.025 % EX CREA: 1.0000 "application " | TOPICAL_CREAM | Freq: Every day | CUTANEOUS | Status: DC | PRN

## 2012-08-04 MED ORDER — BUPROPION HCL ER (SR) 100 MG PO TB12
100.0000 mg | ORAL_TABLET | Freq: Every day | ORAL | Status: DC
  Administered 2012-08-04: 100 mg via ORAL
  Filled 2012-08-04 (×2): qty 1

## 2012-08-04 MED ORDER — TRAMADOL HCL 50 MG PO TABS: 50.0000 mg | ORAL_TABLET | Freq: Four times a day (QID) | ORAL | Status: DC | PRN

## 2012-08-04 MED ORDER — BUPROPION HCL 100 MG PO TABS: 100.0000 mg | ORAL_TABLET | Freq: Every day | ORAL | Status: DC

## 2012-08-04 MED ORDER — HALOPERIDOL LACTATE 5 MG/ML IJ SOLN: 2.0000 mg | Freq: Four times a day (QID) | INTRAMUSCULAR | Status: DC | PRN

## 2012-08-04 MED ORDER — ACETAMINOPHEN 325 MG PO TABS: 650.0000 mg | ORAL_TABLET | Freq: Four times a day (QID) | ORAL | Status: DC | PRN

## 2012-08-04 MED ORDER — ACETAMINOPHEN 650 MG RE SUPP: 650.0000 mg | Freq: Four times a day (QID) | RECTAL | Status: DC | PRN

## 2012-08-04 MED ORDER — CO Q 10 100 MG PO CAPS: 1.0000 | ORAL_CAPSULE | Freq: Every day | ORAL | Status: DC

## 2012-08-04 MED ORDER — SALINE SPRAY 0.65 % NA SOLN: 2.0000 | Freq: Every day | NASAL | Status: DC | PRN

## 2012-08-04 NOTE — Progress Notes (Signed)
[facility] ED CM consulted by EDP, Lynelle Doctor to see if [facility] clinic will still see patient now after ED evaluation for ALOC/aggressive/ambulation status.  CM left Jinny Sanders a voice message at [phone] and for Currie Paris at [phone] updating on this ED visit and request for return call for assistance with getting pt to [facility] clinic or [facility] for goal of getting him in snf CM spoke to [employer] spoke with spouse prior to him coming to [facility] ED.Confirms there are no bed Screening be need for tomorrow Agrees he can not stay in [facility] hospital Stopped admissions to [facility] Need screening

## 2012-08-04 NOTE — ED Notes (Signed)
Staff had to assist patient to standing position. Patient was only able to take one step with staff assistance.

## 2012-08-04 NOTE — Progress Notes (Signed)
[facility] ED CM received a return call from [place], [place] SW stating Caryn Bee entered in travel request for pt Cm reviewed medical necessity with [place] Reasons to include non ambulatory per ED staff notes, in a sling for fx, not on oxygen Cm updated her on pt admission status and [facility] room number being transferred to for location of pickup on 08/05/12 to be taken to [facility] [place] transportation center number is [phone]  Cm called this number to leave a message regarding the pt location at [facility] and request for transport Line states authorization needed from [place] SW Cm updated wife and daughter

## 2012-08-04 NOTE — H&P (Signed)
[facility] History and Physical  [name] [name] WUJ:[id number] DOB: 22-Jun-1923 DOA: 08/04/2012  Referring physician: ER physician PCP: Ginette Otto, MD   Chief Complaint: dementia with aggressive behavior  HPI:  78 year old male with history of dementia, HTN, Dyslipidemia, depression who was brought to ED by family due to progressively worsening dementia associated with more aggressive behavior. Patient is not a good historian due to dementia. Per family reports, patient has become more physically abusive and confused and has had frequent falls over past few months. Patient keeps refusing care offered by his family. Patient was brought to ED in hopes of placement to [facility] center or SNF, whatever is determined to be more appropriate as they are unable to take care of him at home.  Assessment and Plan:  Principal Problem:  *Dementia with behavioral disturbance  Likely due to progressive dementia  We will need PT/OT evaluation for placement as patient's wife cannot take care of the patient  Continue Aricept  Active Problems:  Hypertension  We will continue Norvasc 5 mg daily and Lisinopril 5 mg daily  Hyperlipidemia  Continue atorvastatin 80 mg daily Depression  Continue Lexapro and Wellbutrin  Code status: full code Family education: family not at bedside Disposition: admit to med surg floor for further evaluation  Manson Passey [facility] [phone]  Review of Systems:  Unable to obtain due to dementia.    Past Medical History  Diagnosis Date  . Hypertension   . Hyperlipidemia   . Carotid artery occlusion   . AAA (abdominal aortic aneurysm)   . Valvular heart disease   . Hyperlipidemia   . Cancer     "skin cancer removed from top of head"  . Arthritis     "in his spine"  . Refusal of blood transfusions as patient is Jehovah's Witness 08/31/11  . PTSD (post-traumatic stress disorder)   . Depression     "terrible; from the PTSD"  . Anxiety   . Nightmares    . Spinal stenosis   . Memory loss   . Dementia, vascular Oct. 2013  . Stroke     X three   Past Surgical History  Procedure Date  . Wrist surgery 2003    left; S/P fracture; "put a plate in it"  . Abdominal aortic aneurysm repair 2002  . Carotid endarterectomy 2004    left  . Total knee arthroplasty 2000's    bilaterally  . Cataract extraction w/ intraocular lens  implant, bilateral ~ 2010  . Joint replacement 2003 and 2004    Right and left knee   Social History:  reports that he has quit smoking. His smoking use included Cigarettes. He has never used smokeless tobacco. He reports that he does not drink alcohol or use illicit drugs.  Allergies  Allergen Reactions  . Morphine And Related     Causes confusion    Family History: unable to obtain due to dementia  Prior to Admission medications   Medication Sig Start Date End Date Taking? Authorizing Provider  amLODipine (NORVASC) 5 MG tablet Take 5 mg by mouth daily.   Yes Historical Provider, MD  aspirin 81 MG tablet Take 81 mg by mouth daily.     Yes Historical Provider, MD  atorvastatin (LIPITOR) 80 MG tablet Take 40 mg by mouth daily.   Yes Historical Provider, MD  buPROPion (WELLBUTRIN) 100 MG tablet Take 100 mg by mouth daily.     Yes Historical Provider, MD  Coenzyme Q10 (CO Q 10) 100  MG CAPS Take 1 capsule by mouth daily.     Yes Historical Provider, MD  donepezil (ARICEPT) 10 MG tablet Take 5 mg by mouth daily.   Yes Historical Provider, MD  escitalopram (LEXAPRO) 20 MG tablet Take 20 mg by mouth daily.   Yes Historical Provider, MD  fish oil-omega-3 fatty acids 1000 MG capsule Take 1 g by mouth daily.   Yes Historical Provider, MD  lisinopril (PRINIVIL,ZESTRIL) 5 MG tablet Take 5 mg by mouth daily as needed. For blood pressure greater then 150 07/11/12  Yes Historical Provider, MD  Multiple Vitamins-Minerals (MULTIVITAMINS THER. W/MINERALS) TABS Take 1 tablet by mouth daily.     Yes Historical Provider, MD  SAW  PALMETTO, SERENOA REPENS, PO Take 3 tablets by mouth daily.     Yes Historical Provider, MD  sodium chloride (OCEAN) 0.65 % nasal spray Place 2 sprays into the nose daily as needed. For dryness    Yes Historical Provider, MD  traMADol (ULTRAM) 50 MG tablet Take 50 mg by mouth every 6 (six) hours as needed. pain   Yes Historical Provider, MD  triamcinolone (KENALOG) 0.025 % cream Apply 1 application topically daily as needed. For dryness   Yes Historical Provider, MD   Physical Exam: Filed Vitals:   08/04/12 0930 08/04/12 1202 08/04/12 1516  BP: 153/60 136/76 165/77  Pulse: 70 70 70  Temp: 97.6 F (36.4 C)  97.9 F (36.6 C)  TempSrc: Oral  Oral  Resp: 25 16 25   SpO2: 93% 97% 95%    Physical Exam  Constitutional: Appears in no acute distress HENT: Normocephalic. No tonsillar erythema or exudates Eyes: Conjunctivae and EOM are normal.   Neck: Neck supple. No JVD. No tracheal deviation. No thyromegaly.  CVS: RRR, S1/S2 apprecaited Pulmonary: diminished breath sounds at bases, rhonchi, wheezes, rales.  Abdominal: Soft. BS +,  no distension, tenderness, rebound or guarding.  Musculoskeletal: Normal range of motion. No edema and no tenderness.  Lymphadenopathy: No lymphadenopathy noted, cervical, inguinal. Neuro: alert and awake, no focal neurological deficits Skin: Skin is warm and dry.  Psychiatric: unable to test due to dementia  Labs on Admission:  Basic Metabolic Panel:  Lab 08/04/12 1610 08/02/12 0735  NA 140 139  K 3.9 4.3  CL 102 102  CO2 31 30  GLUCOSE 108* 117*  BUN 30* 27*  CREATININE 0.97 1.24  CALCIUM 9.4 9.5   Liver Function Tests:  Lab 08/04/12 1045 08/02/12 0735  AST 22 24  ALT 13 16  ALKPHOS 74 78  BILITOT 0.5 0.4  PROT 6.3 6.2  ALBUMIN 3.2* 3.4*   CBC:  Lab 08/04/12 1045 08/02/12 0735  WBC 8.6 9.8  HGB 12.8* 13.6  HCT 39.4 41.0  MCV 86.8 86.5  PLT 157 155    Radiological Exams on Admission: Ct Head Wo Contrast 08/04/2012  * IMPRESSION:  Chronic involutional change.  No acute traumatic injury.       Dg Chest Portable 1 View 08/04/2012  * IMPRESSION: 1.  Diffuse bronchial wall thickening and interstitial prominence. Some of this appears to be chronic, however, clinical correlation for signs and symptoms of acute bronchitis is recommended. 2.  Extensive bilateral apical pleuroparenchymal scarring similar to prior examination. There is also likely some atelectasis and/or scarring in the medial aspect of the right lower lobe. 3.  Atherosclerosis. 4.  Mild cardiomegaly.      Dg Shoulder Left Port 08/04/2012  *IMPRESSION:  Again noted impacted fracture of the surgical neck proximal left humerus.  No change in alignment.  Mild degenerative changes AC joint.  No shoulder dislocation.     EKG: not done on admission  Code Status: Full Family Communication: family not at bedside Disposition Plan: Admit for further evaluation  Manson Passey, MD  [facility] Pager [phone]  If 7PM-7AM, please contact night-coverage [email or web address] Password [email or web address] 08/04/2012, 4:03 PM

## 2012-08-04 NOTE — ED Notes (Signed)
Wife at bedside states has fallen twice in past 2 weeks-- most recent Tuesday- has not been able to walk since discharge. Family states has attempted to ambulate without success. Has been able to stand- but legs wobbly, unable to move legs. Left arm in sling

## 2012-08-04 NOTE — Progress Notes (Signed)
[facility] ED CM received a call [facility] to confirm [place] long address and pt location for pick up CM received a call from [place] to also confirm Cm received call from [place] for location of transport on 08/05/12 Wife and daughter updated daughter states her sister to arrive on 08/05/12

## 2012-08-04 NOTE — Progress Notes (Signed)
Home health list provided   HOME HEALTH AGENCIES SERVING [place]   Agencies that are Medicare-Certified and are affiliated with The [facility] Home Health Agency  Telephone Number Address  [facility]   The [facility] has ownership interest in this company; however, you are under no obligation to use this agency. [phone] or  [phone] [address] [place], [place] [address] [email or web address]   Agencies that are Medicare-Certified and are not affiliated with [facility] Telephone Number Address  [facility] [phone] Fax [phone] [address] [place], [place]  [address] [email or web address]  [facility] [phone] or [phone] Fax [phone] [address] [place], [place] [address] [email or web address]  [facility] [phone] Fax [phone] [address] [place], [place] [address] [email or web address]  [facility] [phone] Fax [phone] [address] [place], [place]  [address] [email or web address]  [facility] [phone] Fax [phone] [address] [place], [place] [address] [email or web address]  [facility] [phone] [address] [place], [place] [address] [email or web address]  [facility] [phone]  [address] [place], [place] [address] [email or web address]  [facility] [phone] or [phone] Fax [phone] [address] [place], [place]  [address] [email or web address]  [facility] [phone] Fax [phone] [address] [place], [place]   [address]  [facility]  [phone] Fax [phone] [address] [place], [place] [address] [email or web address]

## 2012-08-04 NOTE — Progress Notes (Signed)
[facility] CM ED received a return call from [place], [place] SW at [phone] to have ED CM not to complete [facility]ed [facility] extended care [facility] because pt is not inpatient and remains only in [facility] ED. Pending a return call back from [place] after she speaks with, tanja, travel consultant about ambulance transport (criteria not ambulating and change in mental status-aggression) for a 08/05/12 10 am appointment at [facility] states she left a voice message for wife on her cell phone.

## 2012-08-04 NOTE — Progress Notes (Signed)
PHARMACIST - PHYSICIAN ORDER COMMUNICATION  CONCERNING: P&T Medication Policy on Herbal Medications  DESCRIPTION:  This patient's order for:  Co-enzyme Q10 has been noted.  This product(s) is classified as an "herbal" or natural product. Due to a lack of definitive safety studies or FDA approval, nonstandard manufacturing practices, plus the potential risk of unknown drug-drug interactions while on inpatient medications, the Pharmacy and Therapeutics Committee does not permit the use of "herbal" or natural products of this type within [place].   ACTION TAKEN: The pharmacy department is unable to verify this order at this time and your patient has been informed of this safety policy. Please reevaluate patient's clinical condition at discharge and address if the herbal or natural product(s) should be resumed at that time.   
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
## 2012-08-04 NOTE — Progress Notes (Signed)
Message sent to Dr Elisabeth Pigeon about pt going to the [place] in the am.  The family said the [place] will pick him up in the morning.

## 2012-08-04 NOTE — Progress Notes (Signed)
[facility] ED CM also recommended another d/c plan option to wife and daughter of possible private pay snf placement [facility] Supervisor confirms [place] does not recommend any snf and family can make the choice CM placed calls x 2 for ED SW pending return calls for possible assistance with assist with a private snf placement option. [place] [facility] CM updated ED SW
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
## 2012-08-04 NOTE — ED Notes (Signed)
[id number]> Expected date:<BR> Expected time:<BR> Means of arrival:<BR> Comments:<BR> ALOC/aggressive

## 2012-08-05 DIAGNOSIS — S42309A Unspecified fracture of shaft of humerus, unspecified arm, initial encounter for closed fracture: Secondary | ICD-10-CM

## 2012-08-05 DIAGNOSIS — S42302A Unspecified fracture of shaft of humerus, left arm, initial encounter for closed fracture: Secondary | ICD-10-CM

## 2012-08-05 DIAGNOSIS — F0390 Unspecified dementia without behavioral disturbance: Secondary | ICD-10-CM

## 2012-08-05 LAB — COMPREHENSIVE METABOLIC PANEL
BUN: 31 mg/dL — ABNORMAL HIGH (ref 6–23)
CO2: 28 mEq/L (ref 19–32)
Chloride: 106 mEq/L (ref 96–112)
Creatinine, Ser: 1.26 mg/dL (ref 0.50–1.35)
GFR calc non Af Amer: 49 mL/min — ABNORMAL LOW (ref >90)
Total Bilirubin: 0.7 mg/dL (ref 0.3–1.2)

## 2012-08-05 LAB — CBC
MCH: 27.8 pg (ref 26.0–34.0)
Platelets: 153 10*3/uL (ref 150–400)
RBC: 4.18 MIL/uL — ABNORMAL LOW (ref 4.22–5.81)
WBC: 7.8 10*3/uL (ref 4.0–10.5)

## 2012-08-05 MED ORDER — ACETAMINOPHEN 325 MG PO TABS: 650.0000 mg | ORAL_TABLET | Freq: Four times a day (QID) | ORAL | Status: DC | PRN

## 2012-08-05 MED ORDER — SODIUM CHLORIDE 0.9 % IV SOLN: INTRAVENOUS | Status: DC
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
## 2012-08-05 NOTE — Progress Notes (Signed)
Subjective: Patient is pleasantly confused. Denies any chest pain or shortness of breath. Wife by bedside.  Objective: Vital signs in last 24 hours: Filed Vitals:   08/04/12 1752 08/04/12 1838 08/04/12 2200 08/05/12 0600  BP: 174/62  140/66 115/60  Pulse: 75  69 64  Temp: 98.1 F (36.7 C)  99.5 F (37.5 C) 97.9 F (36.6 C)  TempSrc: Oral  Oral Axillary  Resp: 20  20 18   Height:  5\' 8"  (1.727 m)    Weight:  77.4 kg (170 lb 10.2 oz)  78.019 kg (172 lb)  SpO2: 99%  94% 93%   Weight change:   Intake/Output Summary (Last 24 hours) at 08/05/12 0926 Last data filed at 08/05/12 0600  Gross per 24 hour  Intake      0 ml  Output    550 ml  Net   -550 ml    Physical Exam: General: Awake, Oriented to year, and city, No acute distress. Not oriented to self or time. HEENT: EOMI. Neck: Supple CV: S1 and S2 Lungs: Clear to ascultation bilaterally Abdomen: Soft, Nontender, Nondistended, +bowel sounds. Ext: Good pulses. Trace edema. Left arm in sling.  Lab Results: Basic Metabolic Panel:  Lab 08/05/12 1191 08/04/12 1045 08/02/12 0735  NA 141 140 139  K 3.9 3.9 4.3  CL 106 102 102  CO2 28 31 30   GLUCOSE 89 108* 117*  BUN 31* 30* 27*  CREATININE 1.26 0.97 1.24  CALCIUM 9.4 9.4 9.5  MG -- -- --  PHOS -- -- --   Liver Function Tests:  Lab 08/05/12 0450 08/04/12 1045 08/02/12 0735  AST 14 22 24   ALT 11 13 16   ALKPHOS 63 74 78  BILITOT 0.7 0.5 0.4  PROT 5.6* 6.3 6.2  ALBUMIN 3.0* 3.2* 3.4*   No results found for this basename: LIPASE:5,AMYLASE:5 in the last 168 hours No results found for this basename: AMMONIA:5 in the last 168 hours CBC:  Lab 08/05/12 0450 08/04/12 1045 08/02/12 0735  WBC 7.8 8.6 9.8  NEUTROABS -- 6.3 --  HGB 11.6* 12.8* 13.6  HCT 36.7* 39.4 41.0  MCV 87.8 86.8 86.5  PLT 153 157 155   Cardiac Enzymes: No results found for this basename: CKTOTAL:5,CKMB:5,CKMBINDEX:5,TROPONINI:5 in the last 168 hours BNP (last 3 results) No results found for this  basename: PROBNP:3 in the last 8760 hours CBG:  Lab 08/05/12 0737  GLUCAP 91   No results found for this basename: HGBA1C:5 in the last 72 hours Other Labs: No components found with this basename: POCBNP:3 No results found for this basename: DDIMER:2 in the last 168 hours No results found for this basename: CHOL:2,HDL:2,LDLCALC:2,TRIG:2,CHOLHDL:2,LDLDIRECT:2 in the last 168 hours No results found for this basename: TSH,T4TOTAL,FREET3,T3FREE,FREET4,THYROIDAB in the last 168 hours No results found for this basename: VITAMINB12:2,FOLATE:2,FERRITIN:2,TIBC:2,IRON:2,RETICCTPCT:2 in the last 168 hours  Micro Results: No results found for this or any previous visit (from the past 240 hour(s)).  Studies/Results: Ct Head Wo Contrast  08/04/2012  *RADIOLOGY REPORT*  Clinical Data: Altered mental status, fall  CT HEAD WITHOUT CONTRAST  Technique:  Contiguous axial images were obtained from the base of the skull through the vertex without contrast.  Comparison: 12/15/2011  Findings: Calvarium is intact.  No hemorrhage extra-axial fluid. No vascular territory infarct.  Moderate atrophy and the low attenuation in the deep white matter.  IMPRESSION: Chronic involutional change.  No acute traumatic injury.   Original Report Authenticated By: Esperanza Heir, M.D.    Dg Chest Portable 1 View  08/04/2012  *RADIOLOGY REPORT*  Clinical Data: Altered mental status.  History of smoking.  PORTABLE CHEST - 1 VIEW  Comparison: Chest x-ray 05/10/2009.  Findings: Lung volumes are low.  Diffuse interstitial prominence similar to, but slightly increased from, the prior examination 05/10/2009.  At least some of this is accounted for by diffuse bronchial wall thickening.  Opacity in the medial right base may reflect atelectasis and/or scarring, and is similar compared to the prior study.  Extensive architectural distortion in the apices bilaterally, favored to reflect chronic pleural parenchymal scarring.  No definite  evidence of pulmonary edema.  Heart size is mildly enlarged.  Mediastinal contours appears slightly distorted by patient positioning which is very lordotic.  There otherwise unchanged compared to the remote prior examination. Atherosclerosis in the thoracic aorta.  IMPRESSION: 1.  Diffuse bronchial wall thickening and interstitial prominence. Some of this appears to be chronic, however, clinical correlation for signs and symptoms of acute bronchitis is recommended. 2.  Extensive bilateral apical pleuroparenchymal scarring similar to prior examination. There is also likely some atelectasis and/or scarring in the medial aspect of the right lower lobe. 3.  Atherosclerosis. 4.  Mild cardiomegaly.   Original Report Authenticated By: Trudie Reed, M.D.    Dg Shoulder Left Port  08/04/2012  *RADIOLOGY REPORT*  Clinical Data: Altered mental status, follow up fracture  PORTABLE LEFT SHOULDER - 2+ VIEW  Comparison:  08/02/2012  Findings: Four views of the left shoulder submitted.  Again noted impacted fracture of the surgical neck proximal left humerus.  No change in alignment.  Mild degenerative changes AC joint.  No shoulder dislocation.  IMPRESSION:  Again noted impacted fracture of the surgical neck proximal left humerus.  No change in alignment.  Mild degenerative changes AC joint.  No shoulder dislocation.   Original Report Authenticated By: Natasha Mead, M.D.     Medications: I have reviewed the patient's current medications. Scheduled Meds:   . amLODipine  5 mg Oral Daily  . aspirin EC  81 mg Oral Daily  . atorvastatin  40 mg Oral Daily  . buPROPion  100 mg Oral Daily  . donepezil  5 mg Oral Daily  . escitalopram  20 mg Oral Daily  . lisinopril  5 mg Oral Daily  . multivitamin with minerals  1 tablet Oral Daily  . omega-3 acid ethyl esters  1 g Oral Daily  . DISCONTD: buPROPion  100 mg Oral Daily  . DISCONTD: Co Q 10  1 capsule Oral Daily   Continuous Infusions:  PRN Meds:.acetaminophen,  acetaminophen, haloperidol lactate, sodium chloride, traMADol, triamcinolone  Assessment/Plan: Acute left humeral fracture Currently left arm in a sling. Discussed with Dr. Victorino Dike, ortho who will evaluate the patient. However patient to be transferred the [place] today. Will need PT/OT.  Dementia with behavioral disturbance  Likely due to progressive dementia from left humeral fracture. We will need PT/OT evaluation for placement as patient's wife cannot take care of the patient. Continue Aricept.  Hypertension  Continue Norvasc 5 mg daily and Lisinopril 5 mg daily.  Dehydration Due to poor PO intake. Start IV fluids.  Hyperlipidemia  Continue atorvastatin 80 mg daily  Depression  Continue Lexapro and Wellbutrin.  Code status: Full code. Discussed with wife. Family education: Wife updated at bedside.  Disposition: Transfer to [facility] at family's request.   LOS: 1 day  Sosha Shepherd A, MD 08/05/2012, 9:26 AM

## 2012-08-05 NOTE — Progress Notes (Signed)
[facility] ED Cm received a call from [facility] [phone] requesting clinicals for [facility].  CM faxed clinicals for 08/04/12 to [place] [facility] at [place] [facility] with confirmation at 1126 and 10/29-31/13 clinicals to [facility] with confirmation at 1134 today, 08/05/12. CM received a call from [facility] staff inquiring of pt's d/c and location CM Contacted [place] staff [place], Laurelyn Sickle to confirm pt was d/[facility] with [facility]. Laurelyn Sickle states [facility] arrived to pick pt up at about 1030 and left at 1105 08/05/12 Pt's [place] appointment was scheduled for 1000 08/05/12. [id number] John from the [place] transportation services called CM to inquire of pt d/c from [facility] He was also informed [facility] transport [facility] arrived a 1030 and pt left at 1105

## 2012-08-05 NOTE — Progress Notes (Signed)
Physical Therapy Note  Order received. Chart reviewed. Noted pt to be discharged to [facility] hospital at this time. Spoke with charge nurse who confirmed this and stated there was no need for PT/OT evals. PT/OT will sign off. Thanks. Rebeca Alert, PT [phone]

## 2012-08-05 NOTE — Progress Notes (Signed)
Utilization review completed.  

## 2012-08-06 LAB — URINE CULTURE: Colony Count: NO GROWTH

## 2013-02-22 ENCOUNTER — Ambulatory Visit (INDEPENDENT_AMBULATORY_CARE_PROVIDER_SITE_OTHER): Payer: Federal, State, Local not specified - PPO | Admitting: Ophthalmology

## 2013-05-09 ENCOUNTER — Encounter: Payer: Self-pay | Admitting: Neurology

## 2013-05-10 ENCOUNTER — Ambulatory Visit (INDEPENDENT_AMBULATORY_CARE_PROVIDER_SITE_OTHER): Payer: Federal, State, Local not specified - PPO | Admitting: Neurology

## 2013-05-10 ENCOUNTER — Encounter: Payer: Self-pay | Admitting: Neurology

## 2013-05-10 VITALS — BP 144/65 | HR 60 | Resp 18 | Ht 66.0 in | Wt 169.0 lb

## 2013-05-10 DIAGNOSIS — G214 Vascular parkinsonism: Secondary | ICD-10-CM

## 2013-05-10 DIAGNOSIS — I6381 Other cerebral infarction due to occlusion or stenosis of small artery: Secondary | ICD-10-CM

## 2013-05-10 DIAGNOSIS — G20 Parkinson's disease: Secondary | ICD-10-CM

## 2013-05-10 DIAGNOSIS — F0391 Unspecified dementia with behavioral disturbance: Secondary | ICD-10-CM

## 2013-05-10 DIAGNOSIS — I6350 Cerebral infarction due to unspecified occlusion or stenosis of unspecified cerebral artery: Secondary | ICD-10-CM

## 2013-05-10 MED ORDER — CARBIDOPA-LEVODOPA 10-100 MG PO TABS: 1.0000 | ORAL_TABLET | Freq: Three times a day (TID) | ORAL | Status: DC

## 2013-05-10 NOTE — Progress Notes (Signed)
[facility]  Provider:  Melvyn Novas, M D  Referring Provider: Ginette Otto, * Primary Care Physician:  Ginette Otto, MD  Chief Complaint  Patient presents with  . New Evaluation    Stoneking, gait disorder, tremor, parkinson's, paper referral, rm 11    HPI:  [name] [name] is a 79 y.o. male  Is seen here as a referral/ revisit  from Dr. Pete Glatter for parkinson's disease evaluation and treatment.  The patient presents in a wheelchair in the presence of his wife, 57 year old Caucasian right-handed gentleman with a history of bilateral knee replacements ( 2006/ 2005) and surgery for aortic aneurysm repair( 2005). Dr. Gerri Spore, his primary care physician brought that the patient has a history of hypertension. Back in November 2013 the patient supposedly had a stroke presented with left arm and left leg weakness and transient expressive aphasia. At the time he was taken care off by the [facility] system and went through a normal rehabilitation in a long-term care facility, returning home in January 20 14.  After the stroke there have been more problems with memory and periodically hallucinations or dimensions. These have been occurring once or twice a week but there have been no behavioral problems. He is hearing impaired, and he doesn't hear things, but has visual hallucinations , he feels as if he is in a dream. These happen and night and if he naps , in daytime. He has been demented according to his wife, tremors at rest , right dominant .  He can control the tremors.  During the [place] stay  the patient was taken off Wellbutrin but remained on Celexa, he also takes Aricept and the generic form. That trial is a dopamine supplement or dopaminergic agonist has not been made, under the assumption that the patient may suffer from Lewy body dementia. He has  Day to day fluctuation in memory and in physical symptoms.                 Review of Systems: Out of  a complete 14 system review, the patient complains of only the following symptoms, and all other reviewed systems are negative. Balance, CVA left side - weakness, syncope, hallucinations, parkinsonian tremor.   History   Social History  . Marital Status: Married    Spouse Name: N/A    Number of Children: 4  . Years of Education: 12   Occupational History  . retired     resides with wife   Social History Main Topics  . Smoking status: Former Smoker    Types: Cigarettes  . Smokeless tobacco: Never Used     Comment: "stopped smoking cigarettes 1969"  . Alcohol Use: No     Comment: quit 2013  . Drug Use: No  . Sexually Active: No   Other Topics Concern  . Not on file   Social History Narrative  . No narrative on file    Family History  Problem Relation Age of Onset  . Heart disease Father     Heart Disease before age 36  . Heart attack Father   . Stroke Mother   . Arthritis Mother   . Hypertension Mother   . Diabetes Maternal Aunt   . Diabetes Brother     Past Medical History  Diagnosis Date  . Hypertension   . Hyperlipidemia   . Carotid artery occlusion   . AAA (abdominal aortic aneurysm)   . Valvular heart disease   . Hyperlipidemia   .  Cancer     "skin cancer removed from top of head"  . Arthritis     "in his spine"  . Refusal of blood transfusions as patient is Jehovah's Witness 08/31/11  . PTSD (post-traumatic stress disorder)   . Depression     "terrible; from the PTSD"  . Anxiety   . Nightmares   . Spinal stenosis   . Memory loss   . Stroke     X three  . Osteoarthritis     bilateral knees  . Aortic sclerosis     insufficiency mild echo, 10/2007  . Hypercholesteremia   . Renal disease     stage lll  . Dementia, vascular Oct. 2013  . Atrial fibrillation   . Back pain   . Thyrotoxicosis     without mention of goiter or other cause, without mention of thyrotixic crisis or storm  . Orthostatic hypotension   . Cardiac dysrhythmia   .  Kidney disorder   . Ureteral disorder   . Movement disorder     Past Surgical History  Procedure Laterality Date  . Wrist surgery  2003    left; S/P fracture; "put a plate in it"  . Abdominal aortic aneurysm repair  2002  . Carotid endarterectomy  2004    left  . Total knee arthroplasty  2000's    bilaterally  . Cataract extraction w/ intraocular lens  implant, bilateral  ~ 2010  . Joint replacement  2003 and 2004    Right and left knee  . Replacement total knee  12/2006    Current Outpatient Prescriptions  Medication Sig Dispense Refill  . acetaminophen (TYLENOL) 325 MG tablet Take 2 tablets (650 mg total) by mouth every 6 (six) hours as needed (or Fever >/= 101).      Marland Kitchen amLODipine (NORVASC) 5 MG tablet Take 5 mg by mouth daily.      Marland Kitchen aspirin 81 MG tablet Take 81 mg by mouth daily.        . Coenzyme Q10 (CO Q 10) 100 MG CAPS Take 1 capsule by mouth daily.        Marland Kitchen donepezil (ARICEPT) 10 MG tablet Take 5 mg by mouth daily.      Marland Kitchen escitalopram (LEXAPRO) 20 MG tablet Take 20 mg by mouth daily.      . fish oil-omega-3 fatty acids 1000 MG capsule Take 1 g by mouth daily.      Marland Kitchen lisinopril (PRINIVIL,ZESTRIL) 5 MG tablet Take 5 mg by mouth daily as needed. For blood pressure greater then 150      . Multiple Vitamins-Minerals (MULTIVITAMINS THER. W/MINERALS) TABS Take 1 tablet by mouth daily.        . SAW PALMETTO, SERENOA REPENS, PO Take 3 tablets by mouth daily.        . sodium chloride (OCEAN) 0.65 % nasal spray Place 2 sprays into the nose daily as needed. For dryness       . traMADol (ULTRAM) 50 MG tablet Take 50 mg by mouth every 6 (six) hours as needed. pain      . triamcinolone (KENALOG) 0.025 % cream Apply 1 application topically daily as needed. For dryness       No current facility-administered medications for this visit.    Allergies as of 05/10/2013 - Review Complete 05/10/2013  Allergen Reaction Noted  . Aceon (perindopril)  05/09/2013  . Morphine and related   08/31/2011    Vitals: BP 144/65  Pulse 60  Resp 18  Ht 5\' 6"  (1.676 m)  Wt 169 lb (76.658 kg)  BMI 27.29 kg/m2 Last Weight:  Wt Readings from Last 1 Encounters:  05/10/13 169 lb (76.658 kg)   Last Height:   Ht Readings from Last 1 Encounters:  05/10/13 5\' 6"  (1.676 m)    Physical exam:  General: The patient is awake, alert and appears not in acute distress. The patient is well groomed. He has a masked face ,  Head: Normocephalic, atraumatic. Neck is supple. Mallampati3, neck circumference:14,  Cardiovascular:  Regular rate and rhythm, has a history in a fib. , without  murmurs or carotid bruit, and without distended neck veins. Respiratory: Lungs are clear to auscultation. Skin:  Without evidence of edema, or rash Trunk: BMI is elevated , wheelchair bound , but transfers with a cane.   Neurologic exam : The patient is awake and alert, oriented to place and time.  Memory impaired, MMSE  Was 23-30, couldn't spell backwards , but calculation was not offered as alternative skill.  There was  a normal attention span - He is heairng impaired . Speech is non- fluent with dysphonia and  aphasia. Mood and affect are appropriate.  Cranial nerves: Pupils are equal and briskly reactive to light. Funduscopic exam without  evidence of pallor or edema. Extraocular movements  in vertical and horizontal planes intact and without nystagmus. Visual fields by finger perimetry are intact. Hearing impaired .  Facial sensation intact to fine touch. Facial motor masking and tongue and uvula move midline.  Motor exam:  Normal tone and normal muscle bulk ,symmetric strength in all extremities. He has no cogwheeling - resting tremor intermittent.   Sensory:  Fine touch, pinprick and vibration were tested in all extremities. Proprioception is tested in the upper extremities only. Left hand abnormal.   Coordination: Rapid alternating movements in the fingers/hands is tested and normal. Finger-to-nose  maneuver tested : on the left  With evidence of ataxia, dysmetria , right with tremor.  Gait and station: Patient walks barely , has propulsive tendencies, left side supported by cane.  Deep tendon reflexes: in the  upper and lower extremities are symmetric and intact. Babinski maneuver response is up -going on the left. .   Assessment:  After physical and neurologic examination, review of laboratory studies, imaging, neurophysiology testing and pre-existing records, assessment is  That of a likely lewy body dementia and vascular overlap.There is cog wheeling over the stroke affected left arm- biceps and wrist , and none on the right dominant side.    Plan:  Treatment plan and additional workup : I would like to give a 7 day trial of sinemet to rule out PD-  Wife and patient aware of the trial character.  Continue the SSRI.  Gait and balance by PT neurorehab.

## 2013-05-10 NOTE — Patient Instructions (Signed)

## 2013-05-10 NOTE — Addendum Note (Signed)
Addended by: Melvyn Novas on: 05/10/2013 05:41 PM   Modules accepted: Orders

## 2013-05-15 ENCOUNTER — Ambulatory Visit
Admission: RE | Admit: 2013-05-15 | Discharge: 2013-05-15 | Disposition: A | Discharge status: Home / Self Care | Payer: Federal, State, Local not specified - PPO | Source: Ambulatory Visit | Attending: Neurology | Admitting: Neurology

## 2013-05-15 DIAGNOSIS — G214 Vascular parkinsonism: Secondary | ICD-10-CM

## 2013-05-15 DIAGNOSIS — G20 Parkinson's disease: Secondary | ICD-10-CM

## 2013-05-15 DIAGNOSIS — I6381 Other cerebral infarction due to occlusion or stenosis of small artery: Secondary | ICD-10-CM

## 2013-05-15 DIAGNOSIS — F0391 Unspecified dementia with behavioral disturbance: Secondary | ICD-10-CM

## 2013-05-19 ENCOUNTER — Telehealth: Payer: Self-pay | Admitting: Neurology

## 2013-05-19 DIAGNOSIS — I6381 Other cerebral infarction due to occlusion or stenosis of small artery: Secondary | ICD-10-CM

## 2013-05-19 DIAGNOSIS — G214 Vascular parkinsonism: Secondary | ICD-10-CM

## 2013-05-19 DIAGNOSIS — F0391 Unspecified dementia with behavioral disturbance: Secondary | ICD-10-CM

## 2013-05-19 MED ORDER — CARBIDOPA-LEVODOPA 10-100 MG PO TABS: 1.0000 | ORAL_TABLET | Freq: Three times a day (TID) | ORAL | Status: DC

## 2013-05-19 NOTE — Telephone Encounter (Signed)
Rx sent 

## 2013-06-06 ENCOUNTER — Ambulatory Visit: Payer: Federal, State, Local not specified - PPO | Attending: Neurology | Admitting: Physical Therapy

## 2013-06-06 DIAGNOSIS — IMO0001 Reserved for inherently not codable concepts without codable children: Secondary | ICD-10-CM | POA: Insufficient documentation

## 2013-06-06 DIAGNOSIS — R269 Unspecified abnormalities of gait and mobility: Secondary | ICD-10-CM | POA: Insufficient documentation

## 2013-06-07 ENCOUNTER — Ambulatory Visit: Payer: Federal, State, Local not specified - PPO | Admitting: Occupational Therapy

## 2013-06-26 ENCOUNTER — Ambulatory Visit: Payer: Federal, State, Local not specified - PPO | Admitting: Physical Therapy

## 2013-06-26 ENCOUNTER — Ambulatory Visit: Payer: Federal, State, Local not specified - PPO

## 2013-06-26 ENCOUNTER — Telehealth: Payer: Self-pay | Admitting: *Deleted

## 2013-06-26 NOTE — Telephone Encounter (Signed)
Baldo Ash, calling about this pt.  Relaying that pt needing ENT consult, prior to ST.  He referred pt to Dr. Pete Glatter for referral to ENT.  This is FYI.

## 2013-06-30 ENCOUNTER — Ambulatory Visit: Payer: Federal, State, Local not specified - PPO | Admitting: Physical Therapy

## 2013-07-03 ENCOUNTER — Ambulatory Visit: Payer: Federal, State, Local not specified - PPO | Admitting: Physical Therapy

## 2013-07-05 ENCOUNTER — Ambulatory Visit: Payer: Federal, State, Local not specified - PPO | Attending: Neurology | Admitting: Physical Therapy

## 2013-07-05 DIAGNOSIS — R269 Unspecified abnormalities of gait and mobility: Secondary | ICD-10-CM | POA: Insufficient documentation

## 2013-07-05 DIAGNOSIS — IMO0001 Reserved for inherently not codable concepts without codable children: Secondary | ICD-10-CM | POA: Insufficient documentation

## 2013-07-10 ENCOUNTER — Ambulatory Visit: Payer: Federal, State, Local not specified - PPO | Admitting: Physical Therapy

## 2013-07-12 ENCOUNTER — Ambulatory Visit: Payer: Federal, State, Local not specified - PPO | Admitting: Physical Therapy

## 2013-07-17 ENCOUNTER — Ambulatory Visit: Payer: Federal, State, Local not specified - PPO | Admitting: Physical Therapy

## 2013-07-18 ENCOUNTER — Other Ambulatory Visit (HOSPITAL_COMMUNITY): Payer: Federal, State, Local not specified - PPO

## 2013-07-18 ENCOUNTER — Ambulatory Visit: Payer: Federal, State, Local not specified - PPO | Admitting: Family

## 2013-07-19 ENCOUNTER — Ambulatory Visit: Payer: Federal, State, Local not specified - PPO

## 2013-07-19 ENCOUNTER — Ambulatory Visit: Payer: Federal, State, Local not specified - PPO | Admitting: Family

## 2013-07-19 ENCOUNTER — Inpatient Hospital Stay (HOSPITAL_COMMUNITY): Admission: RE | Admit: 2013-07-19 | Payer: Federal, State, Local not specified - PPO | Source: Ambulatory Visit

## 2013-07-20 ENCOUNTER — Ambulatory Visit: Payer: Federal, State, Local not specified - PPO

## 2013-07-20 ENCOUNTER — Ambulatory Visit: Payer: Federal, State, Local not specified - PPO | Admitting: Physical Therapy

## 2013-07-24 ENCOUNTER — Ambulatory Visit: Payer: Federal, State, Local not specified - PPO | Admitting: Physical Therapy

## 2013-07-26 ENCOUNTER — Ambulatory Visit: Payer: Federal, State, Local not specified - PPO | Admitting: Physical Therapy

## 2013-07-27 ENCOUNTER — Encounter: Payer: Self-pay | Admitting: Family

## 2013-07-28 ENCOUNTER — Encounter: Payer: Self-pay | Admitting: Family

## 2013-07-28 ENCOUNTER — Ambulatory Visit (INDEPENDENT_AMBULATORY_CARE_PROVIDER_SITE_OTHER): Payer: Federal, State, Local not specified - PPO | Admitting: Family

## 2013-07-28 ENCOUNTER — Ambulatory Visit (HOSPITAL_COMMUNITY)
Admission: RE | Admit: 2013-07-28 | Discharge: 2013-07-28 | Disposition: A | Discharge status: Home / Self Care | Payer: Federal, State, Local not specified - PPO | Source: Ambulatory Visit | Attending: Family | Admitting: Family

## 2013-07-28 DIAGNOSIS — Z48812 Encounter for surgical aftercare following surgery on the circulatory system: Secondary | ICD-10-CM | POA: Insufficient documentation

## 2013-07-28 DIAGNOSIS — I6529 Occlusion and stenosis of unspecified carotid artery: Secondary | ICD-10-CM | POA: Insufficient documentation

## 2013-07-28 NOTE — Patient Instructions (Signed)
Stroke Prevention Some medical conditions and behaviors are associated with an increased chance of having a stroke. You may prevent a stroke by making healthy choices and managing medical conditions. Reduce your risk of having a stroke by:  Staying physically active. Get at least 30 minutes of activity on most or all days.  Not smoking. It may also be helpful to avoid exposure to secondhand smoke.  Limiting alcohol use. Moderate alcohol use is considered to be:  No more than 2 drinks per day for men.  No more than 1 drink per day for nonpregnant women.  Eating healthy foods.  Include 5 or more servings of fruits and vegetables a day.  Certain diets may be prescribed to address high blood pressure, high cholesterol, diabetes, or obesity.  Managing your cholesterol levels.  A low-saturated fat, low-trans fat, low-cholesterol, and high-fiber diet may control cholesterol levels.  Take any prescribed medicines to control cholesterol as directed by your caregiver.  Managing your diabetes.  A controlled-carbohydrate, controlled-sugar diet is recommended to manage diabetes.  Take any prescribed medicines to control diabetes as directed by your caregiver.  Controlling your high blood pressure (hypertension).  A low-salt (sodium), low-saturated fat, low-trans fat, and low-cholesterol diet is recommended to manage high blood pressure.  Take any prescribed medicines to control hypertension as directed by your caregiver.  Maintaining a healthy weight.  A reduced-calorie, low-sodium, low-saturated fat, low-trans fat, low-cholesterol diet is recommended to manage weight.  Stopping drug abuse.  Avoiding birth control pills.  Talk to your caregiver about the risks of taking birth control pills if you are over 35 years old, smoke, get migraines, or have ever had a blood clot.  Getting evaluated for sleep disorders (sleep apnea).  Talk to your caregiver about getting a sleep evaluation  if you snore a lot or have excessive sleepiness.  Taking medicines as directed by your caregiver.  For some people, aspirin or blood thinners (anticoagulants) are helpful in reducing the risk of forming abnormal blood clots that can lead to stroke. If you have the irregular heart rhythm of atrial fibrillation, you should be on a blood thinner unless there is a good reason you cannot take them.  Understand all your medicine instructions. SEEK IMMEDIATE MEDICAL CARE IF:   You have sudden weakness or numbness of the face, arm, or leg, especially on one side of the body.  You have sudden confusion.  You have trouble speaking (aphasia) or understanding.  You have sudden trouble seeing in one or both eyes.  You have sudden trouble walking.  You have dizziness.  You have a loss of balance or coordination.  You have a sudden, severe headache with no known cause.  You have new chest pain or an irregular heartbeat. Any of these symptoms may represent a serious problem that is an emergency. Do not wait to see if the symptoms will go away. Get medical help right away. Call your local emergency services (911 in [place] Do not drive yourself to the hospital. Document Released: 10/29/2004 Document Revised: 12/14/2011 Document Reviewed: 05/11/2011 [employer] Patient Information 2014 [employer].  

## 2013-07-28 NOTE — Progress Notes (Signed)
Established Carotid Patient  Previous Carotid surgery: Yes  History of Present Illness  [name] [name] is a 79 y.o. male patient of Dr. Arbie Cookey who is status post left CEA in 2004. He also had an aorto right CFA and left external iliac artery bypass in 2002. He just finished 4 weeks of physical therapy which has helped right hip pain. He has right hip pain after walking about 4-5 times around his house, denies pain in other parts of his legs with walking. He has been diagnosed by Dr. Pete Glatter with right sciatica, received 1 ESI for this in 2012 which helped.  He has trouble with hypertension, is being followed by medical management. Has had couple of TIA's and a stroke as manifested by generalized weakness and expressive aphasia,  at which time he fell and suffered a fracture in his left shoulder.  Patient denies any weakness but wife thinks he has some expressive aphasia but not as much, had some speech therapy.   denies New Medical or Surgical History  Pt Diabetic: No Pt smoker: former smoker, quit 1969  Pt meds include: Statin : No ASA: Yes Other anticoagulants/antiplatelets: no   Past Medical History  Diagnosis Date  . Hypertension   . Hyperlipidemia   . Carotid artery occlusion   . AAA (abdominal aortic aneurysm)   . Valvular heart disease   . Hyperlipidemia   . Cancer     "skin cancer removed from top of head"  . Arthritis     "in his spine"  . Refusal of blood transfusions as patient is Jehovah's Witness 08/31/11  . PTSD (post-traumatic stress disorder)   . Depression     "terrible; from the PTSD"  . Anxiety   . Nightmares   . Spinal stenosis   . Memory loss   . Osteoarthritis     bilateral knees  . Aortic sclerosis     insufficiency mild echo, 10/2007  . Hypercholesteremia   . Renal disease     stage lll  . Dementia, vascular Oct. 2013  . Atrial fibrillation   . Back pain   . Thyrotoxicosis     without mention of goiter or other cause, without mention  of thyrotixic crisis or storm  . Orthostatic hypotension   . Cardiac dysrhythmia   . Kidney disorder   . Ureteral disorder   . Movement disorder   . Stroke 2013    X three    Social History History  Substance Use Topics  . Smoking status: Former Smoker    Types: Cigarettes  . Smokeless tobacco: Never Used     Comment: "stopped smoking cigarettes 1969"  . Alcohol Use: No     Comment: quit 2013    Family History Family History  Problem Relation Age of Onset  . Heart disease Father     Heart Disease before age 56  . Heart attack Father   . Stroke Mother   . Arthritis Mother   . Hypertension Mother   . Heart attack Mother   . Diabetes Maternal Aunt   . Diabetes Brother   . Heart attack Brother   . Heart attack Sister     Surgical History Past Surgical History  Procedure Laterality Date  . Wrist surgery  2003    left; S/P fracture; "put a plate in it"  . Abdominal aortic aneurysm repair  2002  . Carotid endarterectomy  2004    left  . Total knee arthroplasty  2000's    bilaterally  .  Cataract extraction w/ intraocular lens  implant, bilateral  ~ 2010  . Joint replacement  2003 and 2004    Right and left knee  . Replacement total knee  12/2006  . Eye surgery    . Fracture surgery Left     wrist    Allergies  Allergen Reactions  . Aceon [Perindopril]     diarrhea  . Morphine And Related     Causes confusion    Current Outpatient Prescriptions  Medication Sig Dispense Refill  . acetaminophen (TYLENOL) 325 MG tablet Take 2 tablets (650 mg total) by mouth every 6 (six) hours as needed (or Fever >/= 101).      Marland Kitchen amLODipine (NORVASC) 5 MG tablet Take 5 mg by mouth daily.      Marland Kitchen aspirin 81 MG tablet Take 81 mg by mouth daily.        . carbidopa-levodopa (SINEMET) 10-100 MG per tablet Take 1 tablet by mouth 3 (three) times daily.  90 tablet  6  . Coenzyme Q10 (CO Q 10) 100 MG CAPS Take 1 capsule by mouth daily.        Marland Kitchen donepezil (ARICEPT) 10 MG tablet Take 5  mg by mouth daily.      Marland Kitchen escitalopram (LEXAPRO) 20 MG tablet Take 20 mg by mouth daily.      . fish oil-omega-3 fatty acids 1000 MG capsule Take 1 g by mouth daily.      Marland Kitchen ipratropium (ATROVENT) 0.03 % nasal spray continuous as needed.       Marland Kitchen lisinopril (PRINIVIL,ZESTRIL) 5 MG tablet Take 5 mg by mouth daily as needed. For blood pressure greater then 150      . Multiple Vitamins-Minerals (MULTIVITAMINS THER. W/MINERALS) TABS Take 1 tablet by mouth daily.        . SAW PALMETTO, SERENOA REPENS, PO Take 3 tablets by mouth daily.        . sodium chloride (OCEAN) 0.65 % nasal spray Place 2 sprays into the nose daily as needed. For dryness       . traMADol (ULTRAM) 50 MG tablet Take 50 mg by mouth every 6 (six) hours as needed. pain      . triamcinolone (KENALOG) 0.025 % cream Apply 1 application topically daily as needed. For dryness       No current facility-administered medications for this visit.    Review of Systems : [x]  Positive   [ ]  Denies  General:[ ]  Weight loss,  [ ]  Weight gain, [ ]  Loss of appetite, [ ]  Fever, [ ]  chills  Neurologic: [ ]  Dizziness, [ ]  Blackouts, [ ]  Headaches, [ ]  Seizure [ ]  Stroke, [ ]  "Mini stroke", [ ]  Slurred speech, [ ]  Temporary blindness;  [ ] weakness,  Ear/Nose/Throat: [ ]  Change in hearing, [ ]  Nose bleeds, [ ]  Hoarseness  Vascular:[ ]  Pain in legs with walking, [ ]  Pain in feet while lying flat , [ ]   Non-healing ulcer, [ ]  Blood clot in vein,    Pulmonary: [ ]  Home oxygen, [ ]   Productive cough, [ ]  Bronchitis, [ ]  Coughing up blood,  [ ]  Asthma, [ ]  Wheezing  Musculoskeletal:  [ ]  Arthritis, [ ]  Joint pain, [ ]  low back pain  Cardiac: [ ]  Chest pain, [ ]  Shortness of breath when lying flat, [ ]  Shortness of breath with exertion, [ ]  Palpitations, [ ]  Heart murmur, [ ]   Atrial fibrillation  Hematologic:[ ]  Easy Bruising, [ ]  Anemia; [ ]   Hepatitis  Psychiatric: [ ]   Depression, [ ]  Anxiety   Gastrointestinal: [ ]  Black stool, [ ]  Blood in  stool, [ ]  Peptic ulcer disease,  [ ]  Gastroesophageal Reflux, [ ]  Trouble swallowing, [ ]  Diarrhea, [ ]  Constipation  Urinary: [ ]  chronic Kidney disease, [ ]  on HD, [ ]  Burning with urination, [ ]  Frequent urination, [ ]  Difficulty urinating;   Skin: [ ]  Rashes, [ ]  Wounds    Physical Examination  Filed Vitals:   07/28/13 1211  BP: 121/67  Pulse: 69  Resp:    Filed Weights   07/28/13 1208  Weight: 163 lb (73.936 kg)   Body mass index is 24.79 kg/(m^2).   General: WDWN male in NAD GAIT: slow, deliberate, using walker Eyes: PERRLA Pulmonary:  CTAB, Negative  Rales, Negative rhonchi, & Negative wheezing.  Cardiac: regular Rhythm ,  Negative Murmurs.  VASCULAR EXAM Carotid Bruits Left Right   Negative Negative    Aorta is not palpable. Radial pulses are 2+ palpable and equal.                                                                                                                            LE Pulses LEFT RIGHT       POPLITEAL  not palpable   not palpable    Gastrointestinal: soft, nontender, BS WNL, no r/g,  negative masses.  Musculoskeletal: Negative muscle atrophy/wasting. M/S 3/5 throughout, Extremities without ischemic changes.  Neurologic: A&O X 3; Appropriate Affect ; SENSATION ;normal; resting tremor in head and arms, Speech is fluent aphasia, CN 2-12 intact except hard of hearing, left side facial asymmetry with smile, Pain and light touch intact in extremities, Motor exam as listed above.   Non-Invasive Vascular Imaging CAROTID DUPLEX 07/28/2013   Right ICA: 40 - 59 % stenosis. Left ICA: widely patent CEA site without evidence of restenosis.  These findings are Unchanged from previous exam.  Assessment: [name] [name] is a 79 y.o. male who presents with asymptomatic 40-59% right ICA stenosis and widely patent left CEA site. The  ICA stenosis is  Unchanged from previous exam.  Plan: Follow-up in 1 year with Carotid Duplex scan.   I  discussed in depth with the patient the nature of atherosclerosis, and emphasized the importance of maximal medical management including strict control of blood pressure, blood glucose, and lipid levels, obtaining regular exercise, and continued cessation of smoking.  The patient is aware that without maximal medical management the underlying atherosclerotic disease process will progress, limiting the benefit of any interventions. The patient was given information about stroke prevention and what symptoms should prompt the patient to seek immediate medical care. Thank you for allowing [place] to participate in this patient's care.  Charisse March, RN, MSN, FNP-C [facility] Office: [phone]  Clinic Physician: Imogene Burn  07/28/2013 12:35 PM

## 2013-08-04 ENCOUNTER — Ambulatory Visit: Payer: Federal, State, Local not specified - PPO

## 2013-08-22 ENCOUNTER — Telehealth: Payer: Self-pay | Admitting: *Deleted

## 2013-08-22 NOTE — Telephone Encounter (Signed)
Called patient at home and left a message. Called wife and left another message stating that her husbands appt on 09-12-13 needs to be r/s due to CM having a meeting. Patient can be scheduled with Larita Fife.

## 2013-08-25 NOTE — Telephone Encounter (Signed)
Spoke to the patient wife on 08-24-13 and she stated that they will call back and schedule a follow up appt with Larita Fife once he gets his appointments scheduled at the [facility] hospital.

## 2013-09-12 ENCOUNTER — Ambulatory Visit: Payer: Federal, State, Local not specified - PPO | Admitting: Nurse Practitioner

## 2013-11-21 ENCOUNTER — Ambulatory Visit: Payer: Self-pay | Admitting: Nurse Practitioner

## 2013-11-22 ENCOUNTER — Ambulatory Visit: Payer: Self-pay | Admitting: Nurse Practitioner

## 2013-12-01 ENCOUNTER — Ambulatory Visit: Payer: Self-pay | Admitting: Nurse Practitioner

## 2013-12-27 ENCOUNTER — Ambulatory Visit: Payer: Self-pay | Admitting: Nurse Practitioner

## 2013-12-27 ENCOUNTER — Telehealth: Payer: Self-pay | Admitting: Neurology

## 2013-12-27 ENCOUNTER — Telehealth: Payer: Self-pay | Admitting: Nurse Practitioner

## 2013-12-27 NOTE — Telephone Encounter (Signed)
Patient was no show for today's office appointment.  

## 2013-12-27 NOTE — Telephone Encounter (Signed)
I have never seen this patient, has not seen Dr. Brett Fairy. In over 1.5 years.  Sounds like he needs referral to Psychiatry for depression.

## 2013-12-27 NOTE — Telephone Encounter (Signed)
[name] Nevin Bloodgood calling--patient is having a lot of problems with depression--patient told his wife he cannot make appt today @ 10:30--please call.

## 2013-12-27 NOTE — Telephone Encounter (Addendum)
Spoke with wife and she said that husband is so depressed , can't get patient to come in, he is saying that no one can help him. Wife wants to know If there something Doctor  can do for the lewy body dementia? He is scheduled his pcp on tomorrow and she will call back to schedule later.

## 2013-12-28 NOTE — Telephone Encounter (Addendum)
Wife cannot seem to get patient in for his appointments, so depressed

## 2014-01-08 NOTE — Telephone Encounter (Signed)
Can a relative please make arrangements for a psychiatric visit ? CD

## 2014-01-09 NOTE — Telephone Encounter (Signed)
Spoke to patient's wife and he is being seen by a psychiatrist at the [place], I believe she also mentioned a neurologist at the [place].  She went on and on about other issues but said they will call for a follow up with Dr. Brett Fairy if needed.

## 2014-01-25 ENCOUNTER — Ambulatory Visit: Payer: Federal, State, Local not specified - PPO | Admitting: Physical Therapy

## 2014-01-26 ENCOUNTER — Other Ambulatory Visit (HOSPITAL_COMMUNITY): Payer: Self-pay | Admitting: Geriatric Medicine

## 2014-01-26 DIAGNOSIS — R1310 Dysphagia, unspecified: Secondary | ICD-10-CM

## 2014-01-30 ENCOUNTER — Ambulatory Visit (HOSPITAL_COMMUNITY)
Admission: RE | Admit: 2014-01-30 | Discharge: 2014-01-30 | Disposition: A | Discharge status: Home / Self Care | Payer: Federal, State, Local not specified - PPO | Source: Ambulatory Visit | Attending: Geriatric Medicine | Admitting: Geriatric Medicine

## 2014-01-30 DIAGNOSIS — R1310 Dysphagia, unspecified: Secondary | ICD-10-CM | POA: Insufficient documentation

## 2014-01-30 NOTE — Procedures (Signed)
Objective Swallowing Evaluation: Modified Barium Swallowing Study  Patient Details  Name: [name] [name] MRN: [id number] Date of Birth: 1923/01/16  Today's Date: 01/30/2014 Time: 1310-1400 SLP Time Calculation (min): 50 min  Past Medical History:  Past Medical History  Diagnosis Date  . Hypertension   . Hyperlipidemia   . Carotid artery occlusion   . AAA (abdominal aortic aneurysm)   . Valvular heart disease   . Hyperlipidemia   . Cancer     "skin cancer removed from top of head"  . Arthritis     "in his spine"  . Refusal of blood transfusions as patient is Jehovah's Witness 08/31/11  . PTSD (post-traumatic stress disorder)   . Depression     "terrible; from the PTSD"  . Anxiety   . Nightmares   . Spinal stenosis   . Memory loss   . Osteoarthritis     bilateral knees  . Aortic sclerosis     insufficiency mild echo, 10/2007  . Hypercholesteremia   . Renal disease     stage lll  . Dementia, vascular Oct. 2013  . Atrial fibrillation   . Back pain   . Thyrotoxicosis     without mention of goiter or other cause, without mention of thyrotixic crisis or storm  . Orthostatic hypotension   . Cardiac dysrhythmia   . Kidney disorder   . Ureteral disorder   . Movement disorder   . Stroke 2013    X three   Past Surgical History:  Past Surgical History  Procedure Laterality Date  . Wrist surgery  2003    left; S/P fracture; "put a plate in it"  . Abdominal aortic aneurysm repair  2002  . Carotid endarterectomy  2004    left  . Total knee arthroplasty  2000's    bilaterally  . Cataract extraction w/ intraocular lens  implant, bilateral  ~ 2010  . Joint replacement  2003 and 2004    Right and left knee  . Replacement total knee  12/2006  . Eye surgery    . Fracture surgery Left     wrist   HPI:  80 yo male referred by Dr Felipa Eth for MBS due to concerns pt may be aspirating.  Pt has PMH + for Parkinson's disease, dementia and CVA per referral form from MD office.   Pt also had possible aspiration event over the weekend.  Per spouse, pt was not eating when aspiration event occured and it was presumed pt had aspirated secretions that may come from esophagus.  Per spouse, secretions come into pt's mouth and he expectorates them.  Dysphagia symptoms have progressed since 2009   Pt has been seen by ENT where he reported hoarseness to MD, he was placed on a PPI for 2 weeks but spoupse denies improvement with hoarseness.      Assessment / Plan / Recommendation Clinical Impression  Dysphagia Diagnosis: Moderate oral phase dysphagia;Moderate pharyngeal phase dysphagia;Moderate cervical esophageal phase dysphagia  Clinical impression:  Moderate oropharygo-cervical esophageal dysphagia with suspected esophageal component.  Oral weakness/decreased lingual peristalsis results in lingual pumping, delayed oral transiting and oral stasis.  Pharyngeal swallow characterized by weak muscular contraction resulting in stasis that mixed with standing secretions without pt awareness. CUES to consume liquids and conduct dry swallow faciliate clearance.  Trace aspiration of thin via straw noted - small single boluses not aspirated.   Decreased UES opening due to parkinsons's also contributes to pharyngeal stasis.  Increased viscocity of liquids results in more  pharyngeal residuals and increased post-swallow aspiration/aspiration pna risk-therefore do not recommend use of thickener.  Chin tuck posture tested but not sufficiently helpful in swallow.  Cueing pt to cough/expectorate helpful to clear vallecular residuals and secretions.     Recommend pt follow precautions to mitigate his multifactorial dysphagia.  Spouse and pt educated to findings, results and recommendations and importance of hydration.  Note, pt with throat clearing prior to, during and after MBS.  Advised her to give pt water or cue him to dry swallow when throat clearing as this may contribute to his hoarseness.  Thanks for  this referral.     Treatment Recommendation  No treatment recommended at this time    Diet Recommendation Dysphagia 3 (Mechanical Soft);Thin liquid   Liquid Administration via: Cup;No straw Medication Administration: Crushed with puree Supervision: Patient able to self feed Compensations: Slow rate;Small sips/bites;Multiple dry swallows after each bite/sip;Follow solids with liquid Postural Changes and/or Swallow Maneuvers: Seated upright 90 degrees;Upright 30-60 min after meal (rest break if coughing/sense stasis)    Other  Recommendations Oral Care Recommendations: Oral care BID (rinse mouth with water after meals)   Follow Up Recommendations  None         General Date of Onset: 01/30/14 HPI: 80 yo male referred by Dr Felipa Eth for MBS due to concerns pt may be aspirating.  Pt has PMH + for Parkinson's disease, dementia and CVA per referral form from MD office.  Pt also had possible aspiration event over the weekend.  Per spouse, pt was not eating when aspiration event occured and it was presumed pt had aspirated secretions that may come from esophagus.  Per spouse, secretions come into pt's mouth and he expectorates them.  Dysphagia symptoms have progressed since 2009   Pt has been seen by ENT where he reported hoarseness to MD, he was placed on a PPI for 2 weeks but spoupse denies improvement with hoarseness.  Type of Study: Modified Barium Swallowing Study Reason for Referral: Objectively evaluate swallowing function Diet Prior to this Study: Dysphagia 3 (soft);Thin liquids;Dysphagia 2 (chopped) Temperature Spikes Noted: No Respiratory Status: Room air History of Recent Intubation: No Behavior/Cognition: Alert;Cooperative;Other (comment) (pt has dementia, decreased attention) Oral Cavity - Dentition: Dentures, top;Dentures, bottom Oral Motor / Sensory Function: Impaired motor;Impaired sensory Oral impairment:  (weakness, decreased oral muscular motility, tongue with black  coating) Self-Feeding Abilities: Able to feed self Patient Positioning: Upright in chair Baseline Vocal Quality: Hoarse;Low vocal intensity Volitional Cough: Weak Volitional Swallow: Able to elicit Anatomy: Within functional limits Pharyngeal Secretions: Standing secretions in (comment) (secretions in pharynx retained)    Reason for Referral Objectively evaluate swallowing function   Oral Phase Oral Preparation/Oral Phase Oral Phase: Impaired Oral - Nectar Oral - Nectar Teaspoon: Reduced posterior propulsion;Delayed oral transit;Weak lingual manipulation Oral - Nectar Cup: Delayed oral transit;Weak lingual manipulation Oral - Thin Oral - Thin Teaspoon: Reduced posterior propulsion;Delayed oral transit;Weak lingual manipulation Oral - Thin Cup: Reduced posterior propulsion;Delayed oral transit;Weak lingual manipulation Oral - Thin Straw: Reduced posterior propulsion;Delayed oral transit;Weak lingual manipulation Oral - Solids Oral - Puree: Reduced posterior propulsion;Delayed oral transit;Lingual pumping;Weak lingual manipulation Oral - Regular: Reduced posterior propulsion;Delayed oral transit;Weak lingual manipulation   Pharyngeal Phase Pharyngeal Phase Pharyngeal Phase: Impaired Pharyngeal - Nectar Pharyngeal - Nectar Cup: Reduced pharyngeal peristalsis;Reduced epiglottic inversion;Reduced anterior laryngeal mobility;Reduced laryngeal elevation;Pharyngeal residue - valleculae;Reduced airway/laryngeal closure;Pharyngeal residue - pyriform sinuses Pharyngeal - Thin Pharyngeal - Thin Teaspoon: Reduced pharyngeal peristalsis;Reduced epiglottic inversion;Reduced anterior laryngeal mobility;Reduced laryngeal elevation;Pharyngeal residue -  valleculae;Reduced airway/laryngeal closure;Pharyngeal residue - pyriform sinuses Pharyngeal - Thin Cup: Pharyngeal residue - valleculae;Reduced pharyngeal peristalsis;Reduced tongue base retraction;Reduced epiglottic inversion;Reduced anterior laryngeal  mobility;Reduced laryngeal elevation;Reduced airway/laryngeal closure;Pharyngeal residue - pyriform sinuses Pharyngeal - Thin Straw: Penetration/Aspiration during swallow;Reduced pharyngeal peristalsis;Reduced tongue base retraction;Reduced anterior laryngeal mobility;Reduced epiglottic inversion;Reduced laryngeal elevation;Reduced airway/laryngeal closure;Pharyngeal residue - valleculae;Pharyngeal residue - pyriform sinuses Penetration/Aspiration details (thin straw): Material enters airway, passes BELOW cords without attempt by patient to eject out (silent aspiration);Material enters airway, passes BELOW cords and not ejected out despite cough attempt by patient Pharyngeal - Solids Pharyngeal - Puree: Reduced airway/laryngeal closure;Reduced laryngeal elevation;Reduced anterior laryngeal mobility;Reduced epiglottic inversion;Reduced pharyngeal peristalsis;Reduced tongue base retraction;Pharyngeal residue - valleculae Pharyngeal - Regular: Pharyngeal residue - valleculae;Reduced pharyngeal peristalsis;Reduced epiglottic inversion;Reduced anterior laryngeal mobility;Reduced laryngeal elevation;Reduced airway/laryngeal closure Pharyngeal Phase - Comment Pharyngeal Comment: cues to dry swallow were effective to decrease pharyngeal stasis  Cervical Esophageal Phase    GO    Cervical Esophageal Phase Cervical Esophageal Phase: Impaired Cervical Esophageal Phase - Nectar Nectar Cup: Reduced cricopharyngeal relaxation Cervical Esophageal Phase - Thin Thin Teaspoon: Reduced cricopharyngeal relaxation Thin Cup: Reduced cricopharyngeal relaxation Thin Straw: Reduced cricopharyngeal relaxation Cervical Esophageal Phase - Solids Puree: Reduced cricopharyngeal relaxation Regular: Reduced cricopharyngeal relaxation Cervical Esophageal Phase - Comment Cervical Esophageal Comment: appearance of delayed clearance at distal esophagus WITHOUT pt sensation, thin liquid swallows appear to aid clearance, ? if  findings consistent with motility deficits - radiologist not present to confirm findings    Functional Assessment Tool Used: mbs, clincal judgement Functional Limitations: Swallowing Swallow Current Status (V7793): At least 40 percent but less than 60 percent impaired, limited or restricted Swallow Goal Status [phone]): At least 40 percent but less than 60 percent impaired, limited or restricted Swallow Discharge Status [phone]): At least 40 percent but less than 60 percent impaired, limited or restricted   Luanna Salk, [facility] SLP [phone]

## 2014-03-02 ENCOUNTER — Encounter (HOSPITAL_COMMUNITY): Payer: Self-pay | Admitting: Emergency Medicine

## 2014-03-02 ENCOUNTER — Emergency Department (HOSPITAL_COMMUNITY): Payer: Federal, State, Local not specified - PPO

## 2014-03-02 ENCOUNTER — Inpatient Hospital Stay (HOSPITAL_COMMUNITY)
Admission: EM | Admit: 2014-03-02 | Discharge: 2014-03-08 | DRG: 871 | Disposition: A | Discharge status: Skilled Nursing Facility | Payer: Federal, State, Local not specified - PPO | Attending: Internal Medicine | Admitting: Internal Medicine

## 2014-03-02 DIAGNOSIS — R1310 Dysphagia, unspecified: Secondary | ICD-10-CM | POA: Diagnosis present

## 2014-03-02 DIAGNOSIS — J9600 Acute respiratory failure, unspecified whether with hypoxia or hypercapnia: Secondary | ICD-10-CM

## 2014-03-02 DIAGNOSIS — Z8673 Personal history of transient ischemic attack (TIA), and cerebral infarction without residual deficits: Secondary | ICD-10-CM

## 2014-03-02 DIAGNOSIS — A419 Sepsis, unspecified organism: Principal | ICD-10-CM | POA: Diagnosis present

## 2014-03-02 DIAGNOSIS — M1990 Unspecified osteoarthritis, unspecified site: Secondary | ICD-10-CM | POA: Diagnosis present

## 2014-03-02 DIAGNOSIS — F0391 Unspecified dementia with behavioral disturbance: Secondary | ICD-10-CM

## 2014-03-02 DIAGNOSIS — G20A1 Parkinson's disease without dyskinesia, without mention of fluctuations: Secondary | ICD-10-CM | POA: Diagnosis present

## 2014-03-02 DIAGNOSIS — I4891 Unspecified atrial fibrillation: Secondary | ICD-10-CM | POA: Diagnosis present

## 2014-03-02 DIAGNOSIS — I700 Atherosclerosis of aorta: Secondary | ICD-10-CM | POA: Diagnosis present

## 2014-03-02 DIAGNOSIS — Z823 Family history of stroke: Secondary | ICD-10-CM

## 2014-03-02 DIAGNOSIS — F3289 Other specified depressive episodes: Secondary | ICD-10-CM | POA: Diagnosis present

## 2014-03-02 DIAGNOSIS — I672 Cerebral atherosclerosis: Secondary | ICD-10-CM | POA: Diagnosis present

## 2014-03-02 DIAGNOSIS — J189 Pneumonia, unspecified organism: Secondary | ICD-10-CM

## 2014-03-02 DIAGNOSIS — Z7982 Long term (current) use of aspirin: Secondary | ICD-10-CM

## 2014-03-02 DIAGNOSIS — Z96659 Presence of unspecified artificial knee joint: Secondary | ICD-10-CM

## 2014-03-02 DIAGNOSIS — I10 Essential (primary) hypertension: Secondary | ICD-10-CM | POA: Diagnosis present

## 2014-03-02 DIAGNOSIS — I509 Heart failure, unspecified: Secondary | ICD-10-CM | POA: Diagnosis present

## 2014-03-02 DIAGNOSIS — I6529 Occlusion and stenosis of unspecified carotid artery: Secondary | ICD-10-CM

## 2014-03-02 DIAGNOSIS — Z87891 Personal history of nicotine dependence: Secondary | ICD-10-CM

## 2014-03-02 DIAGNOSIS — Z85828 Personal history of other malignant neoplasm of skin: Secondary | ICD-10-CM

## 2014-03-02 DIAGNOSIS — Z531 Procedure and treatment not carried out because of patient's decision for reasons of belief and group pressure: Secondary | ICD-10-CM

## 2014-03-02 DIAGNOSIS — G20 Parkinson's disease: Secondary | ICD-10-CM | POA: Diagnosis present

## 2014-03-02 DIAGNOSIS — E785 Hyperlipidemia, unspecified: Secondary | ICD-10-CM

## 2014-03-02 DIAGNOSIS — R5381 Other malaise: Secondary | ICD-10-CM | POA: Diagnosis present

## 2014-03-02 DIAGNOSIS — F4310 Post-traumatic stress disorder, unspecified: Secondary | ICD-10-CM | POA: Diagnosis present

## 2014-03-02 DIAGNOSIS — I2510 Atherosclerotic heart disease of native coronary artery without angina pectoris: Secondary | ICD-10-CM | POA: Diagnosis present

## 2014-03-02 DIAGNOSIS — F329 Major depressive disorder, single episode, unspecified: Secondary | ICD-10-CM | POA: Diagnosis present

## 2014-03-02 DIAGNOSIS — Z8249 Family history of ischemic heart disease and other diseases of the circulatory system: Secondary | ICD-10-CM

## 2014-03-02 DIAGNOSIS — F03918 Unspecified dementia, unspecified severity, with other behavioral disturbance: Secondary | ICD-10-CM | POA: Diagnosis present

## 2014-03-02 DIAGNOSIS — S42302A Unspecified fracture of shaft of humerus, left arm, initial encounter for closed fracture: Secondary | ICD-10-CM

## 2014-03-02 DIAGNOSIS — J9601 Acute respiratory failure with hypoxia: Secondary | ICD-10-CM | POA: Diagnosis present

## 2014-03-02 DIAGNOSIS — F411 Generalized anxiety disorder: Secondary | ICD-10-CM | POA: Diagnosis present

## 2014-03-02 DIAGNOSIS — Z48812 Encounter for surgical aftercare following surgery on the circulatory system: Secondary | ICD-10-CM

## 2014-03-02 DIAGNOSIS — Z833 Family history of diabetes mellitus: Secondary | ICD-10-CM

## 2014-03-02 DIAGNOSIS — R413 Other amnesia: Secondary | ICD-10-CM | POA: Diagnosis present

## 2014-03-02 DIAGNOSIS — I5031 Acute diastolic (congestive) heart failure: Secondary | ICD-10-CM | POA: Diagnosis present

## 2014-03-02 DIAGNOSIS — R6520 Severe sepsis without septic shock: Secondary | ICD-10-CM

## 2014-03-02 DIAGNOSIS — F0150 Vascular dementia without behavioral disturbance: Secondary | ICD-10-CM | POA: Diagnosis present

## 2014-03-02 LAB — COMPREHENSIVE METABOLIC PANEL
ALBUMIN: 2.9 g/dL — AB (ref 3.5–5.2)
ALK PHOS: 75 U/L (ref 39–117)
ALT: 9 U/L (ref 0–53)
ALT: 12 U/L (ref 0–53)
AST: 26 U/L (ref 0–37)
AST: 22 U/L (ref 0–37)
Albumin: 2.7 g/dL — ABNORMAL LOW (ref 3.5–5.2)
Alkaline Phosphatase: 79 U/L (ref 39–117)
BILIRUBIN TOTAL: 0.4 mg/dL (ref 0.3–1.2)
BUN: 29 mg/dL — AB (ref 6–23)
BUN: 29 mg/dL — ABNORMAL HIGH (ref 6–23)
CHLORIDE: 105 meq/L (ref 96–112)
CO2: 24 mEq/L (ref 19–32)
CO2: 25 meq/L (ref 19–32)
CREATININE: 1.34 mg/dL (ref 0.50–1.35)
Calcium: 8.8 mg/dL (ref 8.4–10.5)
Calcium: 8.9 mg/dL (ref 8.4–10.5)
Chloride: 103 mEq/L (ref 96–112)
Creatinine, Ser: 1.25 mg/dL (ref 0.50–1.35)
GFR calc Af Amer: 52 mL/min — ABNORMAL LOW (ref >90)
GFR, EST AFRICAN AMERICAN: 57 mL/min — AB (ref >90)
GFR, EST NON AFRICAN AMERICAN: 45 mL/min — AB (ref >90)
GFR, EST NON AFRICAN AMERICAN: 49 mL/min — AB (ref >90)
Glucose, Bld: 111 mg/dL — ABNORMAL HIGH (ref 70–99)
Glucose, Bld: 116 mg/dL — ABNORMAL HIGH (ref 70–99)
POTASSIUM: 4.2 meq/L (ref 3.7–5.3)
Potassium: 4.5 mEq/L (ref 3.7–5.3)
SODIUM: 140 meq/L (ref 137–147)
Sodium: 141 mEq/L (ref 137–147)
Total Bilirubin: 0.4 mg/dL (ref 0.3–1.2)
Total Protein: 5.9 g/dL — ABNORMAL LOW (ref 6.0–8.3)
Total Protein: 6 g/dL (ref 6.0–8.3)

## 2014-03-02 LAB — I-STAT ARTERIAL BLOOD GAS, ED
ACID-BASE EXCESS: 2 mmol/L (ref 0.0–2.0)
Bicarbonate: 26.4 mEq/L — ABNORMAL HIGH (ref 20.0–24.0)
O2 SAT: 100 %
TCO2: 28 mmol/L (ref 0–100)
pCO2 arterial: 41.8 mmHg (ref 35.0–45.0)
pH, Arterial: 7.409 (ref 7.350–7.450)
pO2, Arterial: 380 mmHg — ABNORMAL HIGH (ref 80.0–100.0)

## 2014-03-02 LAB — PHOSPHORUS: Phosphorus: 3.5 mg/dL (ref 2.3–4.6)

## 2014-03-02 LAB — URINE MICROSCOPIC-ADD ON

## 2014-03-02 LAB — URINALYSIS, ROUTINE W REFLEX MICROSCOPIC
Bilirubin Urine: NEGATIVE
Bilirubin Urine: NEGATIVE
GLUCOSE, UA: NEGATIVE mg/dL
Glucose, UA: NEGATIVE mg/dL
HGB URINE DIPSTICK: NEGATIVE
Hgb urine dipstick: NEGATIVE
KETONES UR: 15 mg/dL — AB
KETONES UR: NEGATIVE mg/dL
Leukocytes, UA: NEGATIVE
Leukocytes, UA: NEGATIVE
NITRITE: NEGATIVE
Nitrite: NEGATIVE
PROTEIN: 30 mg/dL — AB
Protein, ur: 30 mg/dL — AB
Specific Gravity, Urine: 1.022 (ref 1.005–1.030)
Specific Gravity, Urine: 1.028 (ref 1.005–1.030)
UROBILINOGEN UA: 0.2 mg/dL (ref 0.0–1.0)
Urobilinogen, UA: 0.2 mg/dL (ref 0.0–1.0)
pH: 6 (ref 5.0–8.0)
pH: 5 (ref 5.0–8.0)

## 2014-03-02 LAB — PROTIME-INR
INR: 1.17 (ref 0.00–1.49)
INR: 1.19 (ref 0.00–1.49)
PROTHROMBIN TIME: 14.7 s (ref 11.6–15.2)
PROTHROMBIN TIME: 14.8 s (ref 11.6–15.2)

## 2014-03-02 LAB — CBC WITH DIFFERENTIAL/PLATELET
BASOS ABS: 0 10*3/uL (ref 0.0–0.1)
Basophils Relative: 0 % (ref 0–1)
EOS PCT: 0 % (ref 0–5)
Eosinophils Absolute: 0 10*3/uL (ref 0.0–0.7)
HEMATOCRIT: 39 % (ref 39.0–52.0)
HEMOGLOBIN: 12.5 g/dL — AB (ref 13.0–17.0)
Lymphocytes Relative: 7 % — ABNORMAL LOW (ref 12–46)
Lymphs Abs: 0.5 10*3/uL — ABNORMAL LOW (ref 0.7–4.0)
MCH: 28.9 pg (ref 26.0–34.0)
MCHC: 32.1 g/dL (ref 30.0–36.0)
MCV: 90.1 fL (ref 78.0–100.0)
MONO ABS: 0.5 10*3/uL (ref 0.1–1.0)
MONOS PCT: 6 % (ref 3–12)
NEUTROS ABS: 7 10*3/uL (ref 1.7–7.7)
Neutrophils Relative %: 87 % — ABNORMAL HIGH (ref 43–77)
Platelets: 146 10*3/uL — ABNORMAL LOW (ref 150–400)
RBC: 4.33 MIL/uL (ref 4.22–5.81)
RDW: 15 % (ref 11.5–15.5)
WBC: 8 10*3/uL (ref 4.0–10.5)

## 2014-03-02 LAB — INFLUENZA PANEL BY PCR (TYPE A & B)
H1N1 flu by pcr: NOT DETECTED
Influenza A By PCR: NEGATIVE
Influenza B By PCR: NEGATIVE

## 2014-03-02 LAB — PRO B NATRIURETIC PEPTIDE: PRO B NATRI PEPTIDE: 1416 pg/mL — AB (ref 0–450)

## 2014-03-02 LAB — LACTIC ACID, PLASMA: LACTIC ACID, VENOUS: 0.9 mmol/L (ref 0.5–2.2)

## 2014-03-02 LAB — STREP PNEUMONIAE URINARY ANTIGEN: STREP PNEUMO URINARY ANTIGEN: NEGATIVE

## 2014-03-02 LAB — I-STAT CG4 LACTIC ACID, ED
LACTIC ACID, VENOUS: 0.8 mmol/L (ref 0.5–2.2)
Lactic Acid, Venous: 1.08 mmol/L (ref 0.5–2.2)

## 2014-03-02 LAB — MRSA PCR SCREENING: MRSA BY PCR: NEGATIVE

## 2014-03-02 LAB — TROPONIN I: Troponin I: <0.3 ng/mL (ref <0.30)

## 2014-03-02 LAB — APTT
APTT: 36 s (ref 24–37)
aPTT: 31 seconds (ref 24–37)

## 2014-03-02 LAB — MAGNESIUM: Magnesium: 2 mg/dL (ref 1.5–2.5)

## 2014-03-02 LAB — TSH: TSH: 0.928 u[IU]/mL (ref 0.350–4.500)

## 2014-03-02 MED ORDER — ACETAMINOPHEN 650 MG RE SUPP
650.0000 mg | Freq: Once | RECTAL | Status: AC
  Administered 2014-03-02: 650 mg via RECTAL
  Filled 2014-03-02: qty 1

## 2014-03-02 MED ORDER — IPRATROPIUM-ALBUTEROL 0.5-2.5 (3) MG/3ML IN SOLN
3.0000 mL | RESPIRATORY_TRACT | Status: DC
  Administered 2014-03-02: 3 mL via RESPIRATORY_TRACT
  Filled 2014-03-02: qty 3

## 2014-03-02 MED ORDER — AMLODIPINE BESYLATE 5 MG PO TABS
5.0000 mg | ORAL_TABLET | Freq: Every day | ORAL | Status: DC
  Administered 2014-03-03 – 2014-03-08 (×6): 5 mg via ORAL
  Filled 2014-03-02 (×7): qty 1

## 2014-03-02 MED ORDER — SODIUM CHLORIDE 0.9 % IV SOLN
INTRAVENOUS | Status: DC
  Administered 2014-03-02: 10:00:00 via INTRAVENOUS

## 2014-03-02 MED ORDER — ONDANSETRON HCL 4 MG/2ML IJ SOLN: 4.0000 mg | Freq: Four times a day (QID) | INTRAMUSCULAR | Status: DC | PRN

## 2014-03-02 MED ORDER — BUPROPION HCL ER (SR) 100 MG PO TB12
100.0000 mg | ORAL_TABLET | Freq: Two times a day (BID) | ORAL | Status: DC
  Administered 2014-03-02 – 2014-03-08 (×12): 100 mg via ORAL
  Filled 2014-03-02 (×15): qty 1

## 2014-03-02 MED ORDER — DEXTROSE 5 % IV SOLN
1.0000 g | INTRAVENOUS | Status: DC
  Administered 2014-03-02 – 2014-03-07 (×6): 1 g via INTRAVENOUS
  Filled 2014-03-02 (×6): qty 10

## 2014-03-02 MED ORDER — ASPIRIN EC 81 MG PO TBEC
81.0000 mg | DELAYED_RELEASE_TABLET | Freq: Every day | ORAL | Status: DC
  Administered 2014-03-03 – 2014-03-08 (×6): 81 mg via ORAL
  Filled 2014-03-02 (×7): qty 1

## 2014-03-02 MED ORDER — CARBIDOPA-LEVODOPA 25-100 MG PO TABS
1.0000 | ORAL_TABLET | Freq: Three times a day (TID) | ORAL | Status: DC
  Administered 2014-03-02 – 2014-03-08 (×18): 1 via ORAL
  Filled 2014-03-02 (×23): qty 1

## 2014-03-02 MED ORDER — ACETAMINOPHEN 325 MG PO TABS
650.0000 mg | ORAL_TABLET | Freq: Four times a day (QID) | ORAL | Status: DC | PRN
Start: 2014-03-02 — End: 2014-03-08

## 2014-03-02 MED ORDER — SODIUM CHLORIDE 0.9 % IJ SOLN
3.0000 mL | Freq: Two times a day (BID) | INTRAMUSCULAR | Status: DC
  Administered 2014-03-02 – 2014-03-08 (×7): 3 mL via INTRAVENOUS

## 2014-03-02 MED ORDER — CHLORHEXIDINE GLUCONATE 0.12 % MT SOLN
15.0000 mL | Freq: Two times a day (BID) | OROMUCOSAL | Status: DC
  Administered 2014-03-02 – 2014-03-08 (×12): 15 mL via OROMUCOSAL
  Filled 2014-03-02 (×15): qty 15

## 2014-03-02 MED ORDER — DEXTROSE 5 % IV SOLN
500.0000 mg | INTRAVENOUS | Status: DC
  Administered 2014-03-02 – 2014-03-05 (×4): 500 mg via INTRAVENOUS
  Filled 2014-03-02 (×3): qty 500

## 2014-03-02 MED ORDER — DONEPEZIL HCL 5 MG PO TABS
5.0000 mg | ORAL_TABLET | Freq: Every day | ORAL | Status: DC
  Administered 2014-03-03 – 2014-03-08 (×6): 5 mg via ORAL
  Filled 2014-03-02 (×7): qty 1

## 2014-03-02 MED ORDER — BIOTENE DRY MOUTH MT LIQD
15.0000 mL | Freq: Two times a day (BID) | OROMUCOSAL | Status: DC
  Administered 2014-03-02 – 2014-03-08 (×11): 15 mL via OROMUCOSAL

## 2014-03-02 MED ORDER — LEVOFLOXACIN IN D5W 500 MG/100ML IV SOLN
500.0000 mg | Freq: Once | INTRAVENOUS | Status: AC
  Administered 2014-03-02: 500 mg via INTRAVENOUS
  Filled 2014-03-02: qty 100

## 2014-03-02 MED ORDER — SODIUM CHLORIDE 0.9 % IV SOLN
1000.0000 mL | Freq: Once | INTRAVENOUS | Status: AC
  Administered 2014-03-02: 1000 mL via INTRAVENOUS

## 2014-03-02 MED ORDER — ONDANSETRON HCL 4 MG PO TABS: 4.0000 mg | ORAL_TABLET | Freq: Four times a day (QID) | ORAL | Status: DC | PRN

## 2014-03-02 MED ORDER — IPRATROPIUM-ALBUTEROL 0.5-2.5 (3) MG/3ML IN SOLN: 3.0000 mL | RESPIRATORY_TRACT | Status: DC | PRN

## 2014-03-02 MED ORDER — IPRATROPIUM-ALBUTEROL 0.5-2.5 (3) MG/3ML IN SOLN
3.0000 mL | Freq: Three times a day (TID) | RESPIRATORY_TRACT | Status: DC
  Administered 2014-03-03 – 2014-03-06 (×11): 3 mL via RESPIRATORY_TRACT
  Filled 2014-03-02 (×11): qty 3

## 2014-03-02 MED ORDER — ACETAMINOPHEN 325 MG PO TABS: 650.0000 mg | ORAL_TABLET | Freq: Once | ORAL | Status: DC

## 2014-03-02 MED ORDER — SODIUM CHLORIDE 0.9 % IV SOLN: 1000.0000 mL | INTRAVENOUS | Status: DC

## 2014-03-02 NOTE — ED Notes (Signed)
Called [employer] Currently holding Step Down Beds.

## 2014-03-02 NOTE — ED Notes (Signed)
Food tray DYS 3 given to patient.

## 2014-03-02 NOTE — Progress Notes (Signed)
03/02/14 1500  SLP Visit Information  SLP Received On 03/02/14  General Information  HPI 80 year old male, world war veteran with past medical history significant for hypertension, dementia who presented to [facility] ED 03/02/2014 due to worsening shortness of breath started 2 days prior to this admission. [name] at the bedside provided the history because patient is lethargic. She reports she recently had a upper respiratory viral infection when seen by her PCP. Chest x-ray done on admission showed air space disease concerning for possible mild pneumonia. Pt had an outpatient MBS on 01/30/14 recommended Dys 3/thin. Pt with standing secretions, weak swallow and decreased UES opening. Pt had trace aspriation of thin, but thickener increased residuals.   Type of Study Bedside swallow evaluation  Previous Swallow Assessment MBS, see HPI  Diet Prior to this Study NPO  Temperature Spikes Noted Yes  Respiratory Status venti-mask  History of Recent Intubation No  Behavior/Cognition Alert;Cooperative;Pleasant mood  Oral Cavity - Dentition Dentures, not available  Self-Feeding Abilities Able to feed self;Needs assist  Patient Positioning Partially reclined (Bed wont go fully upright)  Baseline Vocal Quality Clear  Volitional Cough Strong  Volitional Swallow Able to elicit  Oral Motor/Sensory Function  Overall Oral Motor/Sensory Function Other (comment) (symmetrial, good ROM, general weakness)  Thin Liquid  Thin Liquid Impaired  Presentation Cup;Self Fed  Pharyngeal  Phase Impairments Suspected delayed Swallow;Decreased hyoid-laryngeal movement;Wet Vocal Quality;Throat Clearing - Immediate  Nectar Thick Liquid  Nectar Thick Liquid NT  Honey Thick Liquid  Honey Thick Liquid NT  Puree  Puree NT (none available. )  Solid  Solid NT (dentures not present, recent MBS recommended Dys 3)  SLP - End of Session  Patient left in bed;with family/visitor present  SLP Assessment  Clinical Impression  Statement Pt demonstrates impaired swallowing, identified during MBS last month. Pt continues to struggle with standing secretions in pharynx, weak swallow and immediate throat clearing, likely indicative of trace penetration/aspiration of thin liquids. MBS found that thickening liquids worsened aspiration risk due to increased secretions. Pts function is consistent with prior findings though now pt has concerning dx of mild pna which mayor may not be related to aspiration of po/secretions. Discussed risk with family who confirms that pt should continue eating and drinking with risk given that the alternative of not eating would imapct his quality of life. Offered strategies to reduce risk of aspiration and infection including appropriate posture, cueing pt to clear secretions and providing thorough oral care. Discussed findings with MD and will start pt on dys 3 (mech soft) diet with thin liquids and oral care protocol (with CHG). Pt would benefit from treatment that may reduce secretions if MD has any ideas.   Risk for Aspiration Severe  Other Related Risk Factors History of pneumonia;History of dysphagia;Decreased management of secretions;Decreased respiratory status  Swallow Evaluation Recommendations  Diet Recommendations Dysphagia 3 (Mechanical Soft);Thin liquid  Liquid Administration via Cup;No straw  Medication Administration Whole meds with puree  Supervision Staff to assist with self feeding;Trained caregiver to feed patient;Full supervision/cueing for compensatory strategies  Compensations Slow rate;Small sips/bites;Clear throat intermittently  Postural Changes and/or Swallow Maneuvers Seated upright 90 degrees  Oral Care Recommendations Oral care Q4 per protocol  Other Recommendations Have oral suction available  Follow up Recommendations 24 hour supervision/assistance  Treatment Plan  Treatment Plan Recommendations Therapy as outlined in treatment plan below  Speech Therapy Frequency min  2x/week  Treatment Duration 1 week  Interventions Aspiration precaution training;Compensatory techniques;Patient/family education;Trials of upgraded texture/liquids;Diet toleration management  by SLP  Prognosis  Prognosis for Safe Diet Advancement Fair  Individuals Consulted  Consulted and Agree with Results and Recommendations Patient;Family member/caregiver;MD  Family Member Consulted wife, daughters  SLP Time Calculation  SLP Start Time 55  SLP Stop Time 1600  SLP Time Calculation (min) 30 min  SLP Evaluations  $ SLP Speech Visit 1 Procedure  SLP Evaluations  $BSS Swallow 1 Procedure  $Swallowing Treatment 1 Procedure

## 2014-03-02 NOTE — ED Notes (Addendum)
Per EMS, pts family reported that the he has been sick x 2 days, with congestion. Pt is A-fib on the monitor with a rate of 80-90. Marland Kitchen Pt alert to self, place, and situation. Marland Kitchen Pts SpO2 was 82% on RA upon EMS arrival, pts SpO2 on nonrebreather went up to 96-97%. Pt has hx of dementia.

## 2014-03-02 NOTE — ED Notes (Signed)
Called at ordered [facility] 3/thin diet.

## 2014-03-02 NOTE — ED Notes (Signed)
Called and said bedside swallow evaluation will be completed in the afternoon.

## 2014-03-02 NOTE — ED Notes (Signed)
Unable to stop fluid in [facility] states:  0.9 % sodium chloride infusion Order locked by Algie Coffer, RN since [phone] at workstation [facility].Fluids stopped at [facility]

## 2014-03-02 NOTE — ED Provider Notes (Signed)
CSN: [id number]     Arrival date & time 03/02/14  0418 History   First MD Initiated Contact with Patient 03/02/14 [phone]     Chief Complaint  Patient presents with  . Shortness of Breath     (Consider location/radiation/quality/duration/timing/severity/associated sxs/prior Treatment) HPI Comments: LEVEL 5 CAVEAT -  FOR CONFUSION. Pt comes in with cc of dib. Hx of CAD, HTN, HL, parkinsons. Wife reports that patient has had some viral syndrome as of late. However, over the last 2 days he has been having increased cough, with a fever, and weakness. She brought him in to the ER as patient was having some trouble breathing and was sounding "gargly" with each breaths. Pt is oriented x 2 (not to time), and has no complains. O2 sats at arrival were 81% on room air.  The history is provided by the spouse and the patient.    Past Medical History  Diagnosis Date  . Hypertension   . Hyperlipidemia   . Carotid artery occlusion   . AAA (abdominal aortic aneurysm)   . Valvular heart disease   . Hyperlipidemia   . Cancer     "skin cancer removed from top of head"  . Arthritis     "in his spine"  . Refusal of blood transfusions as patient is Jehovah's Witness 08/31/11  . PTSD (post-traumatic stress disorder)   . Depression     "terrible; from the PTSD"  . Anxiety   . Nightmares   . Spinal stenosis   . Memory loss   . Osteoarthritis     bilateral knees  . Aortic sclerosis     insufficiency mild echo, 10/2007  . Hypercholesteremia   . Renal disease     stage lll  . Dementia, vascular Oct. 2013  . Atrial fibrillation   . Back pain   . Thyrotoxicosis     without mention of goiter or other cause, without mention of thyrotixic crisis or storm  . Orthostatic hypotension   . Cardiac dysrhythmia   . Kidney disorder   . Ureteral disorder   . Movement disorder   . 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 Wrist surgery  2003    left; S/P fracture;  "put a plate in it"  . Abdominal aortic aneurysm repair  2002  . Carotid endarterectomy  2004    left  . Total knee arthroplasty  2000's    bilaterally  . Cataract extraction w/ intraocular lens  implant, bilateral  ~ 2010  . Joint replacement  2003 and 2004    Right and left knee  . Replacement total knee  12/2006  . Eye surgery    . Fracture surgery Left     wrist   Family History  Problem Relation Age of Onset  . Heart disease Father     Heart Disease before age 12  . Heart attack Father   . Stroke Mother   . Arthritis Mother   . Hypertension Mother   . Heart attack Mother   . Diabetes Maternal Aunt   . Diabetes Brother   . Heart attack Brother   . Heart attack Sister    History  Substance Use Topics  . Smoking status: Former Smoker    Types: Cigarettes  . Smokeless tobacco: Never Used     Comment: "stopped smoking cigarettes 1969"  . Alcohol Use: No     Comment: quit 2013    Review of  Systems  Unable to perform ROS: Dementia  Constitutional: Positive for activity change.  Respiratory: Positive for cough and shortness of breath.   Cardiovascular: Negative for chest pain.  Gastrointestinal: Negative for nausea, vomiting and abdominal pain.  Hematological: Does not bruise/bleed easily.  All other systems reviewed and are negative.     Allergies  Aceon and Morphine and related  Home Medications   Prior to Admission medications   Medication Sig Start Date End Date Taking? Authorizing Provider  acetaminophen (TYLENOL) 325 MG tablet Take 2 tablets (650 mg total) by mouth every 6 (six) hours as needed (or Fever >/= 101). 08/05/12  Yes Srikar Janna Arch, MD  amLODipine (NORVASC) 5 MG tablet Take 5 mg by mouth daily.   Yes Historical Provider, MD  aspirin 81 MG tablet Take 81 mg by mouth daily.     Yes Historical Provider, MD  buPROPion (WELLBUTRIN SR) 100 MG 12 hr tablet Take 100 mg by mouth 2 (two) times daily.   Yes Historical Provider, MD  carbidopa-levodopa  (SINEMET IR) 25-100 MG per tablet Take 1 tablet by mouth 3 (three) times daily.   Yes Historical Provider, MD  donepezil (ARICEPT) 10 MG tablet Take 5 mg by mouth daily.   Yes Historical Provider, MD  lisinopril (PRINIVIL,ZESTRIL) 5 MG tablet Take 5 mg by mouth daily as needed. For blood pressure greater then 150 07/11/12  Yes Historical Provider, MD   BP 134/43  Pulse 70  Temp(Src) 100.7 F (38.2 C) (Rectal)  Resp 17  SpO2 97% Physical Exam  Nursing note and vitals reviewed. Constitutional: He is oriented to person, place, and time. He appears well-developed.  HENT:  Head: Normocephalic and atraumatic.  Eyes: Conjunctivae and EOM are normal. Pupils are equal, round, and reactive to light.  Neck: Normal range of motion. Neck supple.  Cardiovascular: Normal rate and regular rhythm.   Murmur heard. Pulmonary/Chest: He is in respiratory distress. He has wheezes.  Diffuse rhonchi.  Abdominal: Soft. Bowel sounds are normal. He exhibits no distension. There is no tenderness. There is no rebound and no guarding.  Neurological: He is alert and oriented to person, place, and time.  Skin: Skin is warm.    ED Course  Procedures (including critical care time) Labs Review Labs Reviewed  CBC WITH DIFFERENTIAL - Abnormal; Notable for the following:    Hemoglobin 12.5 (*)    Platelets 146 (*)    Neutrophils Relative % 87 (*)    Lymphocytes Relative 7 (*)    Lymphs Abs 0.5 (*)    All other components within normal limits  COMPREHENSIVE METABOLIC PANEL - Abnormal; Notable for the following:    Glucose, Bld 111 (*)    BUN 29 (*)    Total Protein 5.9 (*)    Albumin 2.9 (*)    GFR calc non Af Amer 45 (*)    GFR calc Af Amer 52 (*)    All other components within normal limits  PRO B NATRIURETIC PEPTIDE - Abnormal; Notable for the following:    Pro B Natriuretic peptide (BNP) 1416.0 (*)    All other components within normal limits  I-STAT ARTERIAL BLOOD GAS, ED - Abnormal; Notable for the  following:    pO2, Arterial 380.0 (*)    Bicarbonate 26.4 (*)    All other components within normal limits  CULTURE, BLOOD (ROUTINE X 2)  CULTURE, BLOOD (ROUTINE X 2)  URINE CULTURE  APTT  TROPONIN I  LACTIC ACID, PLASMA  PROTIME-INR  URINALYSIS,  ROUTINE W REFLEX MICROSCOPIC  BLOOD GAS, ARTERIAL  I-STAT CG4 LACTIC ACID, ED    Imaging Review Dg Chest Port 1 View  03/02/2014   CLINICAL DATA:  Fever and shortness of breath.  Sepsis.  EXAM: PORTABLE CHEST - 1 VIEW  COMPARISON:  Chest radiograph performed 08/04/2012  FINDINGS: The lungs are well-aerated. Mild bibasilar airspace opacities raise concern for mild pneumonia. Mild vascular congestion is noted. There is no evidence of pleural effusion or pneumothorax.  The cardiomediastinal silhouette is borderline normal in size. No acute osseous abnormalities are seen. There is chronic deformity of the proximal left humerus.  IMPRESSION: Mild bibasilar airspace assess raise concern for mild pneumonia. Vascular congestion noted.   Electronically Signed   By: Garald Balding M.D.   On: 03/02/2014 05:23     EKG Interpretation   Date/Time:  Friday Mar 02 2014 04:34:24 EDT Ventricular Rate:  74 PR Interval:  182 QRS Duration: 105 QT Interval:  380 QTC Calculation: 422 R Axis:   -39 Text Interpretation:  Sinus rhythm Left axis deviation RSR' in V1 or V2,  right VCD or RVH Confirmed by Kathrynn Humble, MD, Thelma Comp [phone]) on 03/02/2014  7:35:42 AM      MDM   Final diagnoses:  Acute respiratory failure  CAP (community acquired pneumonia)  Severe Sepsis  Pt comes in with cc of dib, cough, fever. Noted to be in hypoxic resp failure. Has 3/4 SIRS criteria with a normal lactate. CXR shows PNA. Severe sepsis, with end organa damage being the lungs.  Pt placed on venti mask - 55%. O2 sats in the mid 90s. RR improved from 30 to 18. Pt has diastolic dysfunction and is noted to have some pulm edema. Given the sepsis picture, will not give lasix  immediately, and infact 1.5 liters of bolus given with slow infusion in the ER.  Pt to step down icu.  CRITICAL CARE Performed by: Amarrion Pastorino   Total critical care time: 45 min  Critical care time was exclusive of separately billable procedures and treating other patients.  Critical care was necessary to treat or prevent imminent or life-threatening deterioration.  Critical care was time spent personally by me on the following activities: development of treatment plan with patient and/or surrogate as well as nursing, discussions with consultants, evaluation of patient's response to treatment, examination of patient, obtaining history from patient or surrogate, ordering and performing treatments and interventions, ordering and review of laboratory studies, ordering and review of radiographic studies, pulse oximetry and re-evaluation of patient's condition.     Varney Biles, MD 03/02/14 [phone]

## 2014-03-02 NOTE — ED Notes (Signed)
Speech therapy at bedside for swallow screen.

## 2014-03-02 NOTE — ED Notes (Signed)
Phlebotomy at bedside.

## 2014-03-02 NOTE — H&P (Signed)
[facility] History and Physical  [name] [name] JYN:[id number] DOB: May 24, 1923 DOA: 03/02/2014  Referring physician: ER physician PCP: Mathews Argyle, MD   Chief Complaint: Shortness of breath  HPI:  80 year old male, world war veteran with past medical history significant for hypertension, dementia who presented to [facility] ED 03/02/2014 due to worsening shortness of breath started 2 days prior to this admission. [name] at the bedside provided the history because patient is lethargic. She reports she recently had a upper respiratory viral infection when seen by her PCP. She thinks that her husband has gotten viral infection from heart. She noted 2 days ago that his breathing was different and he used more stomach muscles to breathe while his breathing was shallow. There was no respiratory stridor. Patient did not complain of chest pain. There was no cough or fever. Patient was mostly resting but even with resting she noted his breathing was different. No complaints of abdominal pain or nausea or vomiting. No diarrhea. No reports of blood in the stool or urine. No loss of consciousness.  In ED, blood pressure was 108/56 and then 154/48, heart rate 59-70, T max 100.7 F and oxygen saturation 91% with nasal cannula oxygen support. He is now saturating 97% with Ventimask. His chest x-ray showed possible mild pneumonia. He was given Levaquin in ED and admitted to step down for further management.  Assessment & Plan    Principal Problem:   Acute respiratory failure with hypoxia  Likely secondary to pneumonia. Chest x-ray done on admission showed air space disease concerning for possible mild pneumonia.  Patient was given Levaquin in the ED. We will start azithromycin and Rocephin per pneumonia order set and admission to step down unit  Followup blood culture results, respiratory culture results, strep pneumonia and Legionella, H. influenza and respiratory viral panel  Patient  requires Ventimask to keep oxygen saturation above 90%  Will place order for DuoNeb every 4 hours as scheduled and every 2 hours as needed  Admission to step down unit Active Problems:   Hypertension  Patient takes Norvasc at home as well as lisinopril. Norvasc was ordered but lisinopril was listed as an allergy so we held it   CAP (community acquired pneumonia)  Patient was given Levaquin in ED but we started azithromycin and Rocephin per pneumonia order set / protocol for step down admission  As mentioned above, please followup blood work as part of the pneumonia order set   Dementia  Continue Aricept and Sinemet  Continue Wellbutrin   DVT prophylaxis: SCDs bilaterally since patient has slight thrombocytopenia  Radiological Exams on Admission: Dg Chest Port 1 View 03/02/2014   IMPRESSION: Mild bibasilar airspace assess raise concern for mild pneumonia. Vascular congestion noted.     EKG: Sinus rhythm  Code Status: Full Family Communication: Plan of care discussed with the family at the bedside Disposition Plan: Admit for further evaluation to step down unit  Robbie Lis, MD  [facility] Pager [phone]  Review of Systems:  Unable to obtain. Patient wakes up briefly when called his name but drowsy.  Past Medical History  Diagnosis Date  . Hypertension   . Hyperlipidemia   . Carotid artery occlusion   . AAA (abdominal aortic aneurysm)   . Valvular heart disease   . Hyperlipidemia   . Cancer     "skin cancer removed from top of head"  . Arthritis     "in his spine"  . Refusal of blood transfusions as patient is  Jehovah's Witness 08/31/11  . PTSD (post-traumatic stress disorder)   . Depression     "terrible; from the PTSD"  . Anxiety   . Nightmares   . Spinal stenosis   . Memory loss   . Osteoarthritis     bilateral knees  . Aortic sclerosis     insufficiency mild echo, 10/2007  . Hypercholesteremia   . Renal disease     stage lll  . Dementia,  vascular Oct. 2013  . Atrial fibrillation   . Back pain   . Thyrotoxicosis     without mention of goiter or other cause, without mention of thyrotixic crisis or storm  . Orthostatic hypotension   . Cardiac dysrhythmia   . Kidney disorder   . Ureteral disorder   . Movement disorder   . 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 Wrist surgery  2003    left; S/P fracture; "put a plate in it"  . Abdominal aortic aneurysm repair  2002  . Carotid endarterectomy  2004    left  . Total knee arthroplasty  2000's    bilaterally  . Cataract extraction w/ intraocular lens  implant, bilateral  ~ 2010  . Joint replacement  2003 and 2004    Right and left knee  . Replacement total knee  12/2006  . Eye surgery    . Fracture surgery Left     wrist   Social History:  reports that he has quit smoking. His smoking use included Cigarettes. He smoked 0.00 packs per day. He has never used smokeless tobacco. He reports that he does not drink alcohol or use illicit drugs.  Allergies  Allergen Reactions  . Aceon [Perindopril]     diarrhea  . Morphine And Related     Causes confusion    Family History:  Family History  Problem Relation Age of Onset  . Heart disease Father     Heart Disease before age 36  . Heart attack Father   . Stroke Mother   . Arthritis Mother   . Hypertension Mother   . Heart attack Mother   . Diabetes Maternal Aunt   . Diabetes Brother   . Heart attack Brother   . Heart attack Sister      Prior to Admission medications   Medication Sig Start Date End Date Taking? Authorizing Provider  acetaminophen (TYLENOL) 325 MG tablet Take 2 tablets (650 mg total) by mouth every 6 (six) hours as needed (or Fever >/= 101). 08/05/12  Yes Srikar Janna Arch, MD  amLODipine (NORVASC) 5 MG tablet Take 5 mg by mouth daily.   Yes Historical Provider, MD  aspirin 81 MG tablet Take 81 mg by mouth daily.     Yes Historical Provider, MD  buPROPion  (WELLBUTRIN SR) 100 MG 12 hr tablet Take 100 mg by mouth 2 (two) times daily.   Yes Historical Provider, MD  carbidopa-levodopa (SINEMET IR) 25-100 MG per tablet Take 1 tablet by mouth 3 (three) times daily.   Yes Historical Provider, MD  donepezil (ARICEPT) 10 MG tablet Take 5 mg by mouth daily.   Yes Historical Provider, MD  lisinopril (PRINIVIL,ZESTRIL) 5 MG tablet Take 5 mg by mouth daily as needed. For blood pressure greater then 150 07/11/12  Yes Historical Provider, MD   Physical Exam: Filed Vitals:   03/02/14 0615 03/02/14 0700 03/02/14 0715 03/02/14 0730  BP: 134/43 122/46 131/44 154/48  Pulse: 70 64  63   Temp:      TempSrc:      Resp: 17 17 17 22   SpO2: 97% 92% 94% 97%    Physical Exam  Constitutional: Appears well-developed and well-nourished. Sleeping, no acute distress  HENT: Normocephalic. No tonsillar erythema or exudates; has a Ventimask on Eyes: Conjunctivae are normal. PERRLA, no scleral icterus.  Neck: Neck supple. No JVD. No tracheal deviation. No thyromegaly.  CVS: Irregular, bradycardic, S1/S2 appreciated Pulmonary: Diminished breath sounds, rhonchi appreciated Abdominal: Soft. BS +,  no distension, tenderness, rebound or guarding.  Musculoskeletal: Normal range of motion. No edema and no tenderness.  Lymphadenopathy: No lymphadenopathy noted, cervical, inguinal. Neuro: And Normal reflexes, muscle tone coordination. No focal neurologic deficits. Skin: Skin is warm and dry. Ecchymoses on upper extremities  Psychiatric: Normal mood and affect.   Labs on Admission:  Basic Metabolic Panel:  Recent Labs Lab 03/02/14 0503  NA 141  K 4.5  CL 105  CO2 24  GLUCOSE 111*  BUN 29*  CREATININE 1.34  CALCIUM 8.8   Liver Function Tests:  Recent Labs Lab 03/02/14 0503  AST 26  ALT 9  ALKPHOS 79  BILITOT 0.4  PROT 5.9*  ALBUMIN 2.9*   No results found for this basename: LIPASE, AMYLASE,  in the last 168 hours No results found for this basename: AMMONIA,   in the last 168 hours CBC:  Recent Labs Lab 03/02/14 0503  WBC 8.0  NEUTROABS 7.0  HGB 12.5*  HCT 39.0  MCV 90.1  PLT 146*   Cardiac Enzymes:  Recent Labs Lab 03/02/14 0503  TROPONINI <0.30   BNP: No components found with this basename: POCBNP,  CBG: No results found for this basename: GLUCAP,  in the last 168 hours  If 7PM-7AM, please contact night-coverage [email or web address] Password [facility] 03/02/2014, 7:51 AM

## 2014-03-02 NOTE — Progress Notes (Signed)
ANTIBIOTIC CONSULT NOTE - INITIAL  Pharmacy Consult for renal adjust antibiotics Indication: pneumonia  Allergies  Allergen Reactions  . Aceon [Perindopril]     diarrhea  . Morphine And Related     Causes confusion    Patient Measurements:   Vital Signs: Temp: 100.7 F (38.2 C) (05/29 0438) Temp src: Rectal (05/29 0438) BP: 154/48 mmHg (05/29 0730) Pulse Rate: 63 (05/29 0715) Intake/Output from previous day:   Intake/Output from this shift: Total I/O In: 1600 [I.V.:1600] Out: -   Labs:  Recent Labs  03/02/14 0503  WBC 8.0  HGB 12.5*  PLT 146*  CREATININE 1.34   The CrCl is unknown because both a height and weight (above a minimum accepted value) are required for this calculation. No results found for this basename: VANCOTROUGH, VANCOPEAK, VANCORANDOM, GENTTROUGH, GENTPEAK, GENTRANDOM, TOBRATROUGH, TOBRAPEAK, TOBRARND, AMIKACINPEAK, AMIKACINTROU, AMIKACIN,  in the last 72 hours   Microbiology: No results found for this or any previous visit (from the past 720 hour(s)).  Medical History: Past Medical History  Diagnosis Date  . Hypertension   . Hyperlipidemia   . Carotid artery occlusion   . AAA (abdominal aortic aneurysm)   . Valvular heart disease   . Hyperlipidemia   . Cancer     "skin cancer removed from top of head"  . Arthritis     "in his spine"  . Refusal of blood transfusions as patient is Jehovah's Witness 08/31/11  . PTSD (post-traumatic stress disorder)   . Depression     "terrible; from the PTSD"  . Anxiety   . Nightmares   . Spinal stenosis   . Memory loss   . Osteoarthritis     bilateral knees  . Aortic sclerosis     insufficiency mild echo, 10/2007  . Hypercholesteremia   . Renal disease     stage lll  . Dementia, vascular Oct. 2013  . Atrial fibrillation   . Back pain   . Thyrotoxicosis     without mention of goiter or other cause, without mention of thyrotixic crisis or storm  . Orthostatic hypotension   . Cardiac  dysrhythmia   . Kidney disorder   . Ureteral disorder   . Movement disorder   . 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  CrCl ~50 ml/min  Current antibiotics do not require adjustment based on renal function  Goal of Therapy:  Appropriate dosing of antibiotics  Plan:  Cont antibiotics as ordered  Antwanette Wesche Poteet Kashonda Sarkisyan 03/02/2014,7:53 AM

## 2014-03-02 NOTE — ED Notes (Signed)
Pts SpO2 was at 90% on venturi mask at 40%. Pts venturi mask increased to 55%.

## 2014-03-03 DIAGNOSIS — J189 Pneumonia, unspecified organism: Secondary | ICD-10-CM

## 2014-03-03 DIAGNOSIS — J9600 Acute respiratory failure, unspecified whether with hypoxia or hypercapnia: Secondary | ICD-10-CM

## 2014-03-03 DIAGNOSIS — I10 Essential (primary) hypertension: Secondary | ICD-10-CM

## 2014-03-03 LAB — LEGIONELLA ANTIGEN, URINE: Legionella Antigen, Urine: NEGATIVE

## 2014-03-03 LAB — COMPREHENSIVE METABOLIC PANEL
AST: 18 U/L (ref 0–37)
Albumin: 2.4 g/dL — ABNORMAL LOW (ref 3.5–5.2)
Alkaline Phosphatase: 65 U/L (ref 39–117)
BUN: 31 mg/dL — AB (ref 6–23)
CO2: 24 meq/L (ref 19–32)
Calcium: 8.8 mg/dL (ref 8.4–10.5)
Chloride: 105 mEq/L (ref 96–112)
Creatinine, Ser: 1.23 mg/dL (ref 0.50–1.35)
GFR calc Af Amer: 58 mL/min — ABNORMAL LOW (ref >90)
GFR, EST NON AFRICAN AMERICAN: 50 mL/min — AB (ref >90)
Glucose, Bld: 82 mg/dL (ref 70–99)
Potassium: 4.2 mEq/L (ref 3.7–5.3)
SODIUM: 141 meq/L (ref 137–147)
Total Bilirubin: 0.3 mg/dL (ref 0.3–1.2)
Total Protein: 5.2 g/dL — ABNORMAL LOW (ref 6.0–8.3)

## 2014-03-03 LAB — CBC WITH DIFFERENTIAL/PLATELET
BASOS ABS: 0 10*3/uL (ref 0.0–0.1)
Basophils Relative: 0 % (ref 0–1)
EOS PCT: 0 % (ref 0–5)
Eosinophils Absolute: 0 10*3/uL (ref 0.0–0.7)
HCT: 35.2 % — ABNORMAL LOW (ref 39.0–52.0)
Hemoglobin: 11.5 g/dL — ABNORMAL LOW (ref 13.0–17.0)
LYMPHS ABS: 0.9 10*3/uL (ref 0.7–4.0)
LYMPHS PCT: 8 % — AB (ref 12–46)
MCH: 29.3 pg (ref 26.0–34.0)
MCHC: 32.7 g/dL (ref 30.0–36.0)
MCV: 89.6 fL (ref 78.0–100.0)
Monocytes Absolute: 0.3 10*3/uL (ref 0.1–1.0)
Monocytes Relative: 3 % (ref 3–12)
NEUTROS PCT: 89 % — AB (ref 43–77)
Neutro Abs: 10 10*3/uL — ABNORMAL HIGH (ref 1.7–7.7)
Platelets: 136 10*3/uL — ABNORMAL LOW (ref 150–400)
RBC: 3.93 MIL/uL — AB (ref 4.22–5.81)
RDW: 15 % (ref 11.5–15.5)
WBC: 11.2 10*3/uL — ABNORMAL HIGH (ref 4.0–10.5)

## 2014-03-03 LAB — CBC
HCT: 34.7 % — ABNORMAL LOW (ref 39.0–52.0)
Hemoglobin: 11.4 g/dL — ABNORMAL LOW (ref 13.0–17.0)
MCH: 29.3 pg (ref 26.0–34.0)
MCHC: 32.9 g/dL (ref 30.0–36.0)
MCV: 89.2 fL (ref 78.0–100.0)
Platelets: 134 10*3/uL — ABNORMAL LOW (ref 150–400)
RBC: 3.89 MIL/uL — ABNORMAL LOW (ref 4.22–5.81)
RDW: 15.1 % (ref 11.5–15.5)
WBC: 10.7 10*3/uL — AB (ref 4.0–10.5)

## 2014-03-03 LAB — GLUCOSE, CAPILLARY: GLUCOSE-CAPILLARY: 87 mg/dL (ref 70–99)

## 2014-03-03 LAB — URINE CULTURE
Colony Count: NO GROWTH
Culture: NO GROWTH

## 2014-03-03 MED ORDER — ENSURE COMPLETE PO LIQD
237.0000 mL | Freq: Two times a day (BID) | ORAL | Status: DC
  Administered 2014-03-03 – 2014-03-08 (×9): 237 mL via ORAL

## 2014-03-03 NOTE — Progress Notes (Signed)
[name] [id number]  Transfer Data: 03/03/2014 2:34 PM  Attending Provider: Rise Patience, MD  OHY:[id number],RSW THOMAS, MD  Code Status: Full  [name] [name] is a 80 y.o. male patient transferred from [facility]  -No acute distress noted.  -No complaints of shortness of breath.  -No complaints of chest pain.  Cardiac Monitoring:  Box # 14 in place.  Cardiac monitor yields:normal sinus rhythm.  Blood pressure 142/68, pulse 66, temperature 97.6 F (36.4 C), temperature source Oral, resp. rate 18, height 5\' 8"  (1.727 m), weight 70.4 kg (155 lb 3.3 oz), SpO2 91.00%.  ?  IV Fluids: IV in place, occlusive dsg intact without redness, IV cath antecubital left, condition patent and no redness  normal saline.  Allergies: Aceon and Morphine and related  Past Medical History:  has a past medical history of Hypertension; Hyperlipidemia; Carotid artery occlusion; AAA (abdominal aortic aneurysm); Valvular heart disease; Hyperlipidemia; Cancer; Arthritis; Refusal of blood transfusions as patient is Jehovah's Witness (08/31/11); PTSD (post-traumatic stress disorder); Depression; Anxiety; Nightmares; Spinal stenosis; Memory loss; Osteoarthritis; Aortic sclerosis; Hypercholesteremia; Renal disease; Dementia, vascular (Oct. 2013); Atrial fibrillation; Back pain; Thyrotoxicosis; Orthostatic hypotension; Cardiac dysrhythmia; Kidney disorder; Ureteral disorder; Movement disorder; and Stroke (2013).  Past Surgical History:  has past surgical history that includes Wrist surgery (2003); Abdominal aortic aneurysm repair (2002); Carotid endarterectomy (2004); Total knee arthroplasty (2000's); Cataract extraction w/ intraocular lens  implant, bilateral (~ 2010); Joint replacement (2003 and 2004); Replacement total knee (12/2006); Eye surgery; and Fracture surgery (Left).  Social History:  reports that he has quit smoking. His smoking use included Cigarettes. He smoked 0.00 packs per day. He has never used smokeless  tobacco. He reports that he does not drink alcohol or use illicit drugs.  Skin: intact  Patient/Family orientated to room. Information packet given to patient/family. Admission inpatient armband information verified with patient/family to include name and date of birth and placed on patient arm. Side rails up x 2, fall assessment and education completed with patient/family. Patient/family able to verbalize understanding of risk associated with falls and verbalized understanding to call for assistance before getting out of bed. Call light within reach. Patient/family able to voice and demonstrate understanding of unit orientation instructions.  Will continue to evaluate and treat per MD orders.

## 2014-03-03 NOTE — Progress Notes (Signed)
[facility] PROGRESS NOTE  [name] [name]:[id number] DOB: 09/21/1923 DOA: 03/02/2014 PCP: [name] Argyle, MD  Assessment/Plan: Acute respiratory failure secondary to Sepsis with CAP Likely secondary to pneumonia. Chest x-ray done on admission showed air space disease concerning for possible mild pneumonia.  Presenting leukocytosis of just under 12k with temp of just over 100F, increased respiratory rate, and cxr findings of PNA Patient was given Levaquin in the ED. We cont azithromycin and Rocephin Followup blood culture results, respiratory culture results, strep pneumonia and Legionella, H. influenza and respiratory viral panel  Patient initially required Ventimask, now 4LNC Started DuoNeb every 4 hours as scheduled and every 2 hours as needed  Hypertension  Patient takes Norvasc at home as well as lisinopril. Norvasc was ordered but lisinopril was listed as an allergy so was held BP stable thus far Dementia  Continue Aricept and Sinemet  Continue Wellbutrin DVT Prophylaxis SCD's  Code Status: Full Family Communication: Pt and family at bedside Disposition Plan: Pending   Consultants:    Procedures:    Antibiotics:  Azithromycin 5/29>>>  Rocephin 5/29>>>  HPI/Subjective: Feels better. No major events noted overnight  Objective: Filed Vitals:   03/03/14 0200 03/03/14 0400 03/03/14 0600 03/03/14 0800  BP: 138/45 147/42 150/49 155/93  Pulse: 70 66 70 71  Temp:  97.9 F (36.6 C)  99.9 F (37.7 C)  TempSrc:  Axillary  Axillary  Resp:  19 22 23   Height:      Weight:      SpO2: 92% 92% 95% 97%    Intake/Output Summary (Last 24 hours) at 03/03/14 0801 Last data filed at 03/03/14 0600  Gross per 24 hour  Intake    120 ml  Output    300 ml  Net   -180 ml   Filed Weights   03/02/14 1909  Weight: 70.4 kg (155 lb 3.3 oz)    Exam:   General:  Awake, in nad  Cardiovascular: regular, s1, s2  Respiratory: normal resp effort, no  wheezing  Abdomen: soft, nondistended  Musculoskeletal: perfused, no clubbing   Data Reviewed: Basic Metabolic Panel:  Recent Labs Lab 03/02/14 0503 03/02/14 1818 03/03/14 0319  NA 141 140 141  K 4.5 4.2 4.2  CL 105 103 105  CO2 24 25 24   GLUCOSE 111* 116* 82  BUN 29* 29* 31*  CREATININE 1.34 1.25 1.23  CALCIUM 8.8 8.9 8.8  MG  --  2.0  --   PHOS  --  3.5  --    Liver Function Tests:  Recent Labs Lab 03/02/14 0503 03/02/14 1818 03/03/14 0319  AST 26 22 18   ALT 9 12 <5  ALKPHOS 79 75 65  BILITOT 0.4 0.4 0.3  PROT 5.9* 6.0 5.2*  ALBUMIN 2.9* 2.7* 2.4*   No results found for this basename: LIPASE, AMYLASE,  in the last 168 hours No results found for this basename: AMMONIA,  in the last 168 hours CBC:  Recent Labs Lab 03/02/14 0503 03/02/14 2348 03/03/14 0319  WBC 8.0 11.2* 10.7*  NEUTROABS 7.0 10.0*  --   HGB 12.5* 11.5* 11.4*  HCT 39.0 35.2* 34.7*  MCV 90.1 89.6 89.2  PLT 146* 136* 134*   Cardiac Enzymes:  Recent Labs Lab 03/02/14 0503  TROPONINI <0.30   BNP (last 3 results)  Recent Labs  03/02/14 0503  PROBNP 1416.0*   CBG: No results found for this basename: GLUCAP,  in the last 168 hours  Recent Results (from the past 240 hour(s))  MRSA PCR SCREENING     Status: None   Collection Time    03/02/14  7:24 PM      Result Value Ref Range Status   MRSA by PCR NEGATIVE  NEGATIVE Final   Comment:            The GeneXpert MRSA Assay (FDA     approved for NASAL specimens     only), is one component of a     comprehensive MRSA colonization     surveillance program. It is not     intended to diagnose MRSA     infection nor to guide or     monitor treatment for     MRSA infections.     Studies: Dg Chest Port 1 View  03/02/2014   CLINICAL DATA:  Fever and shortness of breath.  Sepsis.  EXAM: PORTABLE CHEST - 1 VIEW  COMPARISON:  Chest radiograph performed 08/04/2012  FINDINGS: The lungs are well-aerated. Mild bibasilar airspace opacities  raise concern for mild pneumonia. Mild vascular congestion is noted. There is no evidence of pleural effusion or pneumothorax.  The cardiomediastinal silhouette is borderline normal in size. No acute osseous abnormalities are seen. There is chronic deformity of the proximal left humerus.  IMPRESSION: Mild bibasilar airspace assess raise concern for mild pneumonia. Vascular congestion noted.   Electronically Signed   By: Garald Balding M.D.   On: 03/02/2014 05:23    Scheduled Meds: . amLODipine  5 mg Oral Daily  . antiseptic oral rinse  15 mL Mouth Rinse q12n4p  . aspirin EC  81 mg Oral Daily  . azithromycin  500 mg Intravenous Q24H  . buPROPion  100 mg Oral BID  . carbidopa-levodopa  1 tablet Oral TID  . cefTRIAXone (ROCEPHIN)  IV  1 g Intravenous Q24H  . chlorhexidine  15 mL Mouth Rinse BID  . donepezil  5 mg Oral Daily  . ipratropium-albuterol  3 mL Nebulization TID  . sodium chloride  3 mL Intravenous Q12H   Continuous Infusions: . sodium chloride 10 mL/hr at 03/02/14 1018    Principal Problem:   Acute respiratory failure with hypoxia Active Problems:   Hypertension   Dementia with behavioral disturbance   CAP (community acquired pneumonia)   Time spent: 92min   [name] [name]  [facility] Pager [phone]. If 7PM-7AM, please contact night-coverage at [email or web address], password [email or web address] 03/03/2014, 8:01 AM  LOS: 1 day

## 2014-03-03 NOTE — Progress Notes (Signed)
INITIAL NUTRITION ASSESSMENT  DOCUMENTATION CODES Per approved criteria  -Not Applicable   INTERVENTION: - Ensure Complete BID - RD to continue to monitor   NUTRITION DIAGNOSIS: Inadequate oral intake related to poor appetite, lethargy, dementia as evidenced by 25% meal intake.   Goal: Pt to consume >90% of meals/supplements  Monitor:  Weights, labs, intake  Reason for Assessment: Malnutrition screening tool   80 y.o. male  Admitting Dx: Acute respiratory failure with hypoxia  ASSESSMENT: Pt with past medical history significant for hypertension, dementia who presented to [facility] ED 03/02/2014 due to worsening shortness of breath started 2 days prior to this admission. Has been followed by outpatient and inpatient SLP. On dysphagia 3/thin liquid diet.  -Pt asleep, wife provided history -She reports pt eating soft/mashed/moist foods at home, 2 meals/day breakfast and dinner, usually only a snack for lunch as pt does not eat well for caregiver but eats well during meals wife is present for -Wife uses food processor to blend foods. Pt on thin liquids at home and during admission.  -Drinks 2 protein shakes/day from [facility], wife unsure of name or protein content -Usual body weight 170 pounds, currently 155 pounds, past weight trend shows weight down only 8 pounds in the past 7 months, not significant -PO intake during admission 25%, wife agreeable to pt getting Ensure Complete  Height: Ht Readings from Last 1 Encounters:  03/02/14 5\' 8"  (1.727 m)    Weight: Wt Readings from Last 1 Encounters:  03/02/14 155 lb 3.3 oz (70.4 kg)    Ideal Body Weight: 154 lbs  % Ideal Body Weight: 101%  Wt Readings from Last 10 Encounters:  03/02/14 155 lb 3.3 oz (70.4 kg)  07/28/13 163 lb (73.936 kg)  05/10/13 169 lb (76.658 kg)  05/09/13 172 lb 6.4 oz (78.2 kg)  08/05/12 172 lb (78.019 kg)  07/19/12 174 lb 6.4 oz (79.107 kg)  08/31/11 188 lb (85.276 kg)  07/07/11 188 lb (85.276 kg)     Usual Body Weight: 170 lbs per wife  % Usual Body Weight: 91%  BMI:  Body mass index is 23.6 kg/(m^2).  Estimated Nutritional Needs: Kcal: 1950-9326 Protein: 85-105g Fluid: 1.7-1.9L/day  Skin: Intact  Diet Order: Dysphagia 3, thin  EDUCATION NEEDS: -No education needs identified at this time   Intake/Output Summary (Last 24 hours) at 03/03/14 1138 Last data filed at 03/03/14 1031  Gross per 24 hour  Intake    180 ml  Output     50 ml  Net    130 ml    Last BM: PTA  Labs:   Recent Labs Lab 03/02/14 0503 03/02/14 1818 03/03/14 0319  NA 141 140 141  K 4.5 4.2 4.2  CL 105 103 105  CO2 24 25 24   BUN 29* 29* 31*  CREATININE 1.34 1.25 1.23  CALCIUM 8.8 8.9 8.8  MG  --  2.0  --   PHOS  --  3.5  --   GLUCOSE 111* 116* 82    CBG (last 3)   Recent Labs  03/03/14 0826  GLUCAP 87    Scheduled Meds: . amLODipine  5 mg Oral Daily  . antiseptic oral rinse  15 mL Mouth Rinse q12n4p  . aspirin EC  81 mg Oral Daily  . azithromycin  500 mg Intravenous Q24H  . buPROPion  100 mg Oral BID  . carbidopa-levodopa  1 tablet Oral TID  . cefTRIAXone (ROCEPHIN)  IV  1 g Intravenous Q24H  . chlorhexidine  15  mL Mouth Rinse BID  . donepezil  5 mg Oral Daily  . ipratropium-albuterol  3 mL Nebulization TID  . sodium chloride  3 mL Intravenous Q12H    Continuous Infusions: . sodium chloride 10 mL/hr at 03/02/14 1018    Past Medical History  Diagnosis Date  . Hypertension   . Hyperlipidemia   . Carotid artery occlusion   . AAA (abdominal aortic aneurysm)   . Valvular heart disease   . Hyperlipidemia   . Cancer     "skin cancer removed from top of head"  . Arthritis     "in his spine"  . Refusal of blood transfusions as patient is Jehovah's Witness 08/31/11  . PTSD (post-traumatic stress disorder)   . Depression     "terrible; from the PTSD"  . Anxiety   . Nightmares   . Spinal stenosis   . Memory loss   . Osteoarthritis     bilateral knees  . Aortic  sclerosis     insufficiency mild echo, 10/2007  . Hypercholesteremia   . Renal disease     stage lll  . Dementia, vascular Oct. 2013  . Atrial fibrillation   . Back pain   . Thyrotoxicosis     without mention of goiter or other cause, without mention of thyrotixic crisis or storm  . Orthostatic hypotension   . Cardiac dysrhythmia   . Kidney disorder   . Ureteral disorder   . Movement disorder   . 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 Wrist surgery  2003    left; S/P fracture; "put a plate in it"  . Abdominal aortic aneurysm repair  2002  . Carotid endarterectomy  2004    left  . Total knee arthroplasty  2000's    bilaterally  . Cataract extraction w/ intraocular lens  implant, bilateral  ~ 2010  . Joint replacement  2003 and 2004    Right and left knee  . Replacement total knee  12/2006  . Eye surgery    . Fracture surgery Left     wrist    Mikey College MS, RD, LDN [phone] Weekend/After Hours Pager

## 2014-03-04 DIAGNOSIS — I10 Essential (primary) hypertension: Secondary | ICD-10-CM

## 2014-03-04 LAB — URINE CULTURE
COLONY COUNT: NO GROWTH
CULTURE: NO GROWTH

## 2014-03-04 LAB — GLUCOSE, CAPILLARY: Glucose-Capillary: 94 mg/dL (ref 70–99)

## 2014-03-04 MED ORDER — LISINOPRIL 5 MG PO TABS
5.0000 mg | ORAL_TABLET | Freq: Every day | ORAL | Status: DC
  Administered 2014-03-04 – 2014-03-08 (×5): 5 mg via ORAL
  Filled 2014-03-04 (×5): qty 1

## 2014-03-04 MED ORDER — BISACODYL 10 MG RE SUPP: 10.0000 mg | Freq: Every day | RECTAL | Status: DC | PRN

## 2014-03-04 MED ORDER — POLYETHYLENE GLYCOL 3350 17 G PO PACK
17.0000 g | PACK | Freq: Every day | ORAL | Status: DC
  Administered 2014-03-04 – 2014-03-08 (×5): 17 g via ORAL
  Filled 2014-03-04 (×5): qty 1

## 2014-03-04 MED ORDER — SENNOSIDES-DOCUSATE SODIUM 8.6-50 MG PO TABS: 1.0000 | ORAL_TABLET | Freq: Every evening | ORAL | Status: DC | PRN

## 2014-03-04 NOTE — Progress Notes (Signed)
[facility] PROGRESS NOTE  HARLEY FITZWATER UEA:[id number] DOB: [birth year] DOA: 03/02/2014 PCP: Mathews Argyle, MD  Assessment/Plan: Acute respiratory failure secondary to Sepsis with CAP Likely secondary to pneumonia. Chest x-ray done on admission showed air space disease concerning for possible mild pneumonia.  Presenting leukocytosis of just under 12k with temp of just over 100F, increased respiratory rate, and cxr findings of PNA Patient was given Levaquin in the ED. We cont azithromycin and Rocephin Followup blood culture results, respiratory culture results, strep pneumonia and Legionella, H. influenza and respiratory viral panel  Patient initially required Ventimask, now 4LNC Started DuoNeb every 4 hours as scheduled and every 2 hours as needed  Reports feeling better Hypertension  Patient takes Norvasc at home as well as lisinopril. Norvasc was ordered but lisinopril was listed as an allergy so was held BP rising. ACEI allergy that is documented with rxn of diarrha As pt was taking lisinopril prior to admit, will resume home med Dementia  Continue Aricept and Sinemet  Continue Wellbutrin DVT Prophylaxis SCD's  Code Status: Full Family Communication: Pt and family in room Disposition Plan: Pending   Consultants:    Procedures:    Antibiotics:  Azithromycin 5/29>>>  Rocephin 5/29>>>  HPI/Subjective: Feels better. Reports being constipated  Objective: Filed Vitals:   03/03/14 1421 03/03/14 2124 03/03/14 2130 03/04/14 0448  BP: 142/68  161/67 155/65  Pulse: 66 67 71 68  Temp: 97.6 F (36.4 C)  97.5 F (36.4 C) 98 F (36.7 C)  TempSrc: Oral  Oral Axillary  Resp: 18 16 18 16   Height:      Weight:    73.3 kg (161 lb 9.6 oz)  SpO2: 91% 93% 98% 95%    Intake/Output Summary (Last 24 hours) at 03/04/14 0857 Last data filed at 03/04/14 0813  Gross per 24 hour  Intake    657 ml  Output    750 ml  Net    -93 ml   Filed Weights   03/02/14 1909  03/04/14 0448  Weight: 70.4 kg (155 lb 3.3 oz) 73.3 kg (161 lb 9.6 oz)    Exam:   General:  Awake, in nad  Cardiovascular: regular, s1, s2  Respiratory: normal resp effort, no wheezing  Abdomen: soft, nondistended  Musculoskeletal: perfused, no clubbing   Data Reviewed: Basic Metabolic Panel:  Recent Labs Lab 03/02/14 0503 03/02/14 1818 03/03/14 0319  NA 141 140 141  K 4.5 4.2 4.2  CL 105 103 105  CO2 24 25 24   GLUCOSE 111* 116* 82  BUN 29* 29* 31*  CREATININE 1.34 1.25 1.23  CALCIUM 8.8 8.9 8.8  MG  --  2.0  --   PHOS  --  3.5  --    Liver Function Tests:  Recent Labs Lab 03/02/14 0503 03/02/14 1818 03/03/14 0319  AST 26 22 18   ALT 9 12 <5  ALKPHOS 79 75 65  BILITOT 0.4 0.4 0.3  PROT 5.9* 6.0 5.2*  ALBUMIN 2.9* 2.7* 2.4*   No results found for this basename: LIPASE, AMYLASE,  in the last 168 hours No results found for this basename: AMMONIA,  in the last 168 hours CBC:  Recent Labs Lab 03/02/14 0503 03/02/14 2348 03/03/14 0319  WBC 8.0 11.2* 10.7*  NEUTROABS 7.0 10.0*  --   HGB 12.5* 11.5* 11.4*  HCT 39.0 35.2* 34.7*  MCV 90.1 89.6 89.2  PLT 146* 136* 134*   Cardiac Enzymes:  Recent Labs Lab 03/02/14 0503  TROPONINI <0.30   BNP (  last 3 results)  Recent Labs  03/02/14 0503  PROBNP 1416.0*   CBG:  Recent Labs Lab 03/03/14 0826 03/04/14 0745  GLUCAP 87 94    Recent Results (from the past 240 hour(s))  CULTURE, BLOOD (ROUTINE X 2)     Status: None   Collection Time    03/02/14  5:03 AM      Result Value Ref Range Status   Specimen Description BLOOD RIGHT WRIST   Final   Special Requests BOTTLES DRAWN AEROBIC AND ANAEROBIC 5MLS   Final   Culture  Setup Time     Final   Value: 03/02/2014 11:18     Performed at [facility]   Culture     Final   Value:        BLOOD CULTURE RECEIVED NO GROWTH TO DATE CULTURE WILL BE HELD FOR 5 DAYS BEFORE ISSUING A FINAL NEGATIVE REPORT     Performed at [facility]    Report Status PENDING   Incomplete  CULTURE, BLOOD (ROUTINE X 2)     Status: None   Collection Time    03/02/14  5:08 AM      Result Value Ref Range Status   Specimen Description BLOOD RIGHT ARM   Final   Special Requests BOTTLES DRAWN AEROBIC ONLY 5CCS   Final   Culture  Setup Time     Final   Value: 03/02/2014 11:18     Performed at [facility]   Culture     Final   Value:        BLOOD CULTURE RECEIVED NO GROWTH TO DATE CULTURE WILL BE HELD FOR 5 DAYS BEFORE ISSUING A FINAL NEGATIVE REPORT     Performed at [facility]   Report Status PENDING   Incomplete  URINE CULTURE     Status: None   Collection Time    03/02/14  8:32 AM      Result Value Ref Range Status   Specimen Description URINE, CATHETERIZED   Final   Special Requests NONE   Final   Culture  Setup Time     Final   Value: 03/02/2014 14:16     Performed at [place]     Final   Value: NO GROWTH     Performed at [facility]   Culture     Final   Value: NO GROWTH     Performed at [facility]   Report Status 03/03/2014 FINAL   Final  MRSA PCR SCREENING     Status: None   Collection Time    03/02/14  7:24 PM      Result Value Ref Range Status   MRSA by PCR NEGATIVE  NEGATIVE Final   Comment:            The GeneXpert MRSA Assay (FDA     approved for NASAL specimens     only), is one component of a     comprehensive MRSA colonization     surveillance program. It is not     intended to diagnose MRSA     infection nor to guide or     monitor treatment for     MRSA infections.  URINE CULTURE     Status: None   Collection Time    03/02/14  9:06 PM      Result Value Ref Range Status   Specimen Description URINE, RANDOM   Final   Special  Requests NONE   Final   Culture  Setup Time     Final   Value: 03/03/2014 01:55     Performed at [place]     Final   Value: NO GROWTH     Performed at [facility]   Culture      Final   Value: NO GROWTH     Performed at [facility]   Report Status 03/04/2014 FINAL   Final     Studies: No results found.  Scheduled Meds: . amLODipine  5 mg Oral Daily  . antiseptic oral rinse  15 mL Mouth Rinse q12n4p  . aspirin EC  81 mg Oral Daily  . azithromycin  500 mg Intravenous Q24H  . buPROPion  100 mg Oral BID  . carbidopa-levodopa  1 tablet Oral TID  . cefTRIAXone (ROCEPHIN)  IV  1 g Intravenous Q24H  . chlorhexidine  15 mL Mouth Rinse BID  . donepezil  5 mg Oral Daily  . feeding supplement (ENSURE COMPLETE)  237 mL Oral BID BM  . ipratropium-albuterol  3 mL Nebulization TID  . sodium chloride  3 mL Intravenous Q12H   Continuous Infusions: . sodium chloride 10 mL/hr at 03/02/14 1018    Principal Problem:   Acute respiratory failure with hypoxia Active Problems:   Hypertension   Dementia with behavioral disturbance   CAP (community acquired pneumonia)   Time spent: 62min   Amauria Younts K Gary Gabrielsen  [facility] Pager [phone]. If 7PM-7AM, please contact night-coverage at [email or web address], password [facility] 03/04/2014, 8:57 AM  LOS: 2 days

## 2014-03-05 ENCOUNTER — Inpatient Hospital Stay (HOSPITAL_COMMUNITY): Payer: Federal, State, Local not specified - PPO

## 2014-03-05 DIAGNOSIS — I517 Cardiomegaly: Secondary | ICD-10-CM

## 2014-03-05 LAB — CBC
HCT: 36.9 % — ABNORMAL LOW (ref 39.0–52.0)
Hemoglobin: 12.2 g/dL — ABNORMAL LOW (ref 13.0–17.0)
MCH: 29 pg (ref 26.0–34.0)
MCHC: 33.1 g/dL (ref 30.0–36.0)
MCV: 87.9 fL (ref 78.0–100.0)
PLATELETS: 167 10*3/uL (ref 150–400)
RBC: 4.2 MIL/uL — ABNORMAL LOW (ref 4.22–5.81)
RDW: 14.9 % (ref 11.5–15.5)
WBC: 7.2 10*3/uL (ref 4.0–10.5)

## 2014-03-05 LAB — RESPIRATORY VIRUS PANEL
Adenovirus: NOT DETECTED
INFLUENZA A H3: NOT DETECTED
Influenza A H1: NOT DETECTED
Influenza A: NOT DETECTED
Influenza B: NOT DETECTED
Metapneumovirus: DETECTED — AB
PARAINFLUENZA 2 A: NOT DETECTED
Parainfluenza 1: NOT DETECTED
Parainfluenza 3: NOT DETECTED
RESPIRATORY SYNCYTIAL VIRUS A: NOT DETECTED
RESPIRATORY SYNCYTIAL VIRUS B: NOT DETECTED
Rhinovirus: NOT DETECTED

## 2014-03-05 LAB — BASIC METABOLIC PANEL
BUN: 18 mg/dL (ref 6–23)
CO2: 28 mEq/L (ref 19–32)
CREATININE: 0.78 mg/dL (ref 0.50–1.35)
Calcium: 8.9 mg/dL (ref 8.4–10.5)
Chloride: 104 mEq/L (ref 96–112)
GFR calc Af Amer: >90 mL/min (ref >90)
GFR, EST NON AFRICAN AMERICAN: 78 mL/min — AB (ref >90)
Glucose, Bld: 93 mg/dL (ref 70–99)
Potassium: 4 mEq/L (ref 3.7–5.3)
SODIUM: 142 meq/L (ref 137–147)

## 2014-03-05 MED ORDER — FUROSEMIDE 10 MG/ML IJ SOLN
40.0000 mg | Freq: Every day | INTRAMUSCULAR | Status: DC
  Administered 2014-03-05: 40 mg via INTRAVENOUS
  Filled 2014-03-05 (×2): qty 4

## 2014-03-05 MED ORDER — AZITHROMYCIN 500 MG PO TABS
500.0000 mg | ORAL_TABLET | Freq: Every day | ORAL | Status: DC
  Administered 2014-03-06: 500 mg via ORAL
  Filled 2014-03-05 (×2): qty 1

## 2014-03-05 NOTE — Progress Notes (Signed)
Utilization review completed.  

## 2014-03-05 NOTE — Progress Notes (Signed)
Speech Language Pathology Treatment: Dysphagia  Patient Details Name: [name] [name] MRN: [id number] DOB: 1923/08/29 Today's Date: 03/05/2014 Time: 9373-4287 SLP Time Calculation (min): 26 min  Assessment / Plan / Recommendation Clinical Impression  Pt demonstrates persistent evidence of aspiration consistent with dx of chronic dysphagia. SLP provided further verbal instruction and demonstration to pts wife regarding oral care and cueing for intermittent throat clearing and cough. Reiterated aspiration risk with PO intake. Pts diet has been downgraded to puree, which he enjoys and does very well with per wife. She plans to use a food processor at home to prepare his meals. Given that aspiration risk will be ongoing palliative care consult may be helpful to establish a plan of care and reduce further hospital hospital admissions.    HPI HPI: 80 year old male, world war veteran with past medical history significant for hypertension, dementia who presented to [facility] ED 03/02/2014 due to worsening shortness of breath started 2 days prior to this admission. [name] at the bedside provided the history because patient is lethargic. She reports she recently had a upper respiratory viral infection when seen by her PCP. Chest x-ray done on admission showed air space disease concerning for possible mild pneumonia. Pt had an outpatient MBS on 01/30/14 recommended Dys 3/thin. Pt with standing secretions, weak swallow and decreased UES opening. Pt had trace aspriation of thin, but thickener increased residuals.    Pertinent Vitals NA  SLP Plan  Continue with current plan of care    Recommendations Diet recommendations: Dysphagia 1 (puree);Thin liquid Liquids provided via: Cup Medication Administration: Whole meds with puree Supervision: Staff to assist with self feeding;Trained caregiver to feed patient;Full supervision/cueing for compensatory strategies Compensations: Slow rate;Small sips/bites;Clear throat  intermittently Postural Changes and/or Swallow Maneuvers: Seated upright 90 degrees              Oral Care Recommendations: Oral care Q4 per protocol Follow up Recommendations: 24 hour supervision/assistance Plan: Continue with current plan of care    GO    [facility], MA CCC-SLP [place] Tarek Cravens 03/05/2014, 9:32 AM

## 2014-03-05 NOTE — Progress Notes (Signed)
  Echocardiogram 2D Echocardiogram has been performed.  Valinda Hoar 03/05/2014, 4:03 PM

## 2014-03-05 NOTE — Progress Notes (Signed)
[facility] PROGRESS NOTE  [name] [name] KDX:[id number] DOB: December 28, 1922 DOA: 03/02/2014 PCP: Mathews Argyle, MD  Assessment/Plan: Acute respiratory failure secondary to Sepsis with CAP Likely secondary to pneumonia. Chest x-ray done on admission showed air space disease concerning for possible mild pneumonia.  Presenting leukocytosis of just under 12k with temp of just over 100F, increased respiratory rate, and cxr findings of PNA Patient was given Levaquin in the ED. We cont azithromycin and Rocephin Followup blood culture results, respiratory culture results, strep pneumonia and Legionella, H. influenza and respiratory viral panel  Patient initially required Ventimask, had been on 4LNC, but up to 5L this AM Started DuoNeb every 4 hours as scheduled and every 2 hours as needed  Will repeat CXR as o2 requirements have changed Hypertension  Patient takes Norvasc at home as well as lisinopril. Norvasc was ordered but lisinopril was listed as an allergy so was held BP rising. ACEI allergy that is documented with rxn of diarrha As pt was taking lisinopril prior to admit, will resume home med Dementia  Continue Aricept and Sinemet  Continue Wellbutrin DVT Prophylaxis SCD's  Code Status: Full Family Communication: Pt and wife in room Disposition Plan: Pending   Consultants:    Procedures:    Antibiotics:  Azithromycin 5/29>>>  Rocephin 5/29>>>  HPI/Subjective: Reports feeling better.  Objective: Filed Vitals:   03/04/14 2015 03/05/14 0500 03/05/14 0548 03/05/14 0829  BP: 161/64  158/66   Pulse: 79  72   Temp: 98.1 F (36.7 C)  98.1 F (36.7 C)   TempSrc: Oral  Oral   Resp: 18  18   Height:      Weight:  75.7 kg (166 lb 14.2 oz)    SpO2: 94%  95% 88%    Intake/Output Summary (Last 24 hours) at 03/05/14 1105 Last data filed at 03/05/14 0927  Gross per 24 hour  Intake    613 ml  Output   1350 ml  Net   -737 ml   Filed Weights   03/02/14 1909  03/04/14 0448 03/05/14 0500  Weight: 70.4 kg (155 lb 3.3 oz) 73.3 kg (161 lb 9.6 oz) 75.7 kg (166 lb 14.2 oz)    Exam:   General:  Awake, in nad  Cardiovascular: regular, s1, s2  Respiratory: normal resp effort, no wheezing  Abdomen: soft, nondistended  Musculoskeletal: perfused, no clubbing   Data Reviewed: Basic Metabolic Panel:  Recent Labs Lab 03/02/14 0503 03/02/14 1818 03/03/14 0319  NA 141 140 141  K 4.5 4.2 4.2  CL 105 103 105  CO2 24 25 24   GLUCOSE 111* 116* 82  BUN 29* 29* 31*  CREATININE 1.34 1.25 1.23  CALCIUM 8.8 8.9 8.8  MG  --  2.0  --   PHOS  --  3.5  --    Liver Function Tests:  Recent Labs Lab 03/02/14 0503 03/02/14 1818 03/03/14 0319  AST 26 22 18   ALT 9 12 <5  ALKPHOS 79 75 65  BILITOT 0.4 0.4 0.3  PROT 5.9* 6.0 5.2*  ALBUMIN 2.9* 2.7* 2.4*   No results found for this basename: LIPASE, AMYLASE,  in the last 168 hours No results found for this basename: AMMONIA,  in the last 168 hours CBC:  Recent Labs Lab 03/02/14 0503 03/02/14 2348 03/03/14 0319  WBC 8.0 11.2* 10.7*  NEUTROABS 7.0 10.0*  --   HGB 12.5* 11.5* 11.4*  HCT 39.0 35.2* 34.7*  MCV 90.1 89.6 89.2  PLT 146* 136* 134*  Cardiac Enzymes:  Recent Labs Lab 03/02/14 0503  TROPONINI <0.30   BNP (last 3 results)  Recent Labs  03/02/14 0503  PROBNP 1416.0*   CBG:  Recent Labs Lab 03/03/14 0826 03/04/14 0745  GLUCAP 87 94    Recent Results (from the past 240 hour(s))  CULTURE, BLOOD (ROUTINE X 2)     Status: None   Collection Time    03/02/14  5:03 AM      Result Value Ref Range Status   Specimen Description BLOOD RIGHT WRIST   Final   Special Requests BOTTLES DRAWN AEROBIC AND ANAEROBIC 5MLS   Final   Culture  Setup Time     Final   Value: 03/02/2014 11:18     Performed at [facility]   Culture     Final   Value:        BLOOD CULTURE RECEIVED NO GROWTH TO DATE CULTURE WILL BE HELD FOR 5 DAYS BEFORE ISSUING A FINAL NEGATIVE REPORT      Performed at [facility]   Report Status PENDING   Incomplete  CULTURE, BLOOD (ROUTINE X 2)     Status: None   Collection Time    03/02/14  5:08 AM      Result Value Ref Range Status   Specimen Description BLOOD RIGHT ARM   Final   Special Requests BOTTLES DRAWN AEROBIC ONLY 5CCS   Final   Culture  Setup Time     Final   Value: 03/02/2014 11:18     Performed at [facility]   Culture     Final   Value:        BLOOD CULTURE RECEIVED NO GROWTH TO DATE CULTURE WILL BE HELD FOR 5 DAYS BEFORE ISSUING A FINAL NEGATIVE REPORT     Performed at [facility]   Report Status PENDING   Incomplete  URINE CULTURE     Status: None   Collection Time    03/02/14  8:32 AM      Result Value Ref Range Status   Specimen Description URINE, CATHETERIZED   Final   Special Requests NONE   Final   Culture  Setup Time     Final   Value: 03/02/2014 14:16     Performed at [place]     Final   Value: NO GROWTH     Performed at [facility]   Culture     Final   Value: NO GROWTH     Performed at [facility]   Report Status 03/03/2014 FINAL   Final  MRSA PCR SCREENING     Status: None   Collection Time    03/02/14  7:24 PM      Result Value Ref Range Status   MRSA by PCR NEGATIVE  NEGATIVE Final   Comment:            The GeneXpert MRSA Assay (FDA     approved for NASAL specimens     only), is one component of a     comprehensive MRSA colonization     surveillance program. It is not     intended to diagnose MRSA     infection nor to guide or     monitor treatment for     MRSA infections.  URINE CULTURE     Status: None   Collection Time    03/02/14  9:06 PM      Result Value Ref  Range Status   Specimen Description URINE, RANDOM   Final   Special Requests NONE   Final   Culture  Setup Time     Final   Value: 03/03/2014 01:55     Performed at [place]     Final   Value: NO GROWTH     Performed at  [facility]   Culture     Final   Value: NO GROWTH     Performed at [facility]   Report Status 03/04/2014 FINAL   Final     Studies: No results found.  Scheduled Meds: . amLODipine  5 mg Oral Daily  . antiseptic oral rinse  15 mL Mouth Rinse q12n4p  . aspirin EC  81 mg Oral Daily  . [START ON 03/06/2014] azithromycin  500 mg Oral Daily  . buPROPion  100 mg Oral BID  . carbidopa-levodopa  1 tablet Oral TID  . cefTRIAXone (ROCEPHIN)  IV  1 g Intravenous Q24H  . chlorhexidine  15 mL Mouth Rinse BID  . donepezil  5 mg Oral Daily  . feeding supplement (ENSURE COMPLETE)  237 mL Oral BID BM  . ipratropium-albuterol  3 mL Nebulization TID  . lisinopril  5 mg Oral Daily  . polyethylene glycol  17 g Oral Daily  . sodium chloride  3 mL Intravenous Q12H   Continuous Infusions: . sodium chloride 10 mL/hr at 03/02/14 1018    Principal Problem:   Acute respiratory failure with hypoxia Active Problems:   Hypertension   Dementia with behavioral disturbance   CAP (community acquired pneumonia)   Time spent: 25min   Sander Remedios K Magin Balbi  [facility] Pager [phone]. If 7PM-7AM, please contact night-coverage at [email or web address], password [facility] 03/05/2014, 11:05 AM  LOS: 3 days

## 2014-03-06 LAB — BASIC METABOLIC PANEL
BUN: 18 mg/dL (ref 6–23)
CO2: 29 mEq/L (ref 19–32)
CREATININE: 0.9 mg/dL (ref 0.50–1.35)
Calcium: 8.9 mg/dL (ref 8.4–10.5)
Chloride: 102 mEq/L (ref 96–112)
GFR calc non Af Amer: 73 mL/min — ABNORMAL LOW (ref >90)
GFR, EST AFRICAN AMERICAN: 85 mL/min — AB (ref >90)
GLUCOSE: 89 mg/dL (ref 70–99)
POTASSIUM: 4.1 meq/L (ref 3.7–5.3)
Sodium: 140 mEq/L (ref 137–147)

## 2014-03-06 LAB — GLUCOSE, CAPILLARY: Glucose-Capillary: 95 mg/dL (ref 70–99)

## 2014-03-06 MED ORDER — FUROSEMIDE 10 MG/ML IJ SOLN: 60.0000 mg | Freq: Every day | INTRAMUSCULAR | Status: DC

## 2014-03-06 MED ORDER — FUROSEMIDE 10 MG/ML IJ SOLN
60.0000 mg | Freq: Every day | INTRAMUSCULAR | Status: DC
  Administered 2014-03-07 – 2014-03-08 (×2): 60 mg via INTRAVENOUS
  Filled 2014-03-06 (×2): qty 6

## 2014-03-06 MED ORDER — FUROSEMIDE 10 MG/ML IJ SOLN
80.0000 mg | Freq: Once | INTRAMUSCULAR | Status: AC
  Administered 2014-03-06: 80 mg via INTRAVENOUS
  Filled 2014-03-06: qty 8

## 2014-03-06 NOTE — Progress Notes (Signed)
[facility] PROGRESS NOTE  [name] [name] LPF:[id number] DOB: Feb 25, 1923 DOA: 03/02/2014 PCP: Mathews Argyle, MD  Assessment/Plan: Acute respiratory failure secondary to Sepsis with CAP Likely secondary to pneumonia. Chest x-ray done on admission showed air space disease concerning for possible mild pneumonia.  Presenting leukocytosis of just under 12k with temp of just over 100F, increased respiratory rate, and cxr findings of PNA Patient was given Levaquin in the ED. Have cont azithromycin and Rocephin upon admission Followup blood culture results, respiratory culture results, strep pneumonia and Legionella, H. influenza and respiratory viral panel  Patient initially required Ventimask, had been on 4LNC, but has required 5L [place] since 6/1 Started DuoNeb every 4 hours as scheduled and every 2 hours as needed  Repeat CXR with evidence of worsening pulm edema Started IV lasix - follow i/o 2D echo with EF 50-55% with grade 1 diastolic dysfunction Hypertension  BP suboptimally controlled Cont home meds for now Lasix added per above for diuresis Dementia  Continue Aricept and Sinemet  Continue Wellbutrin DVT Prophylaxis SCD's  Code Status: Full Family Communication: Pt and family in room Disposition Plan: Pending   Consultants:    Procedures:    Antibiotics:  Azithromycin 5/29>>>  Rocephin 5/29>>>  HPI/Subjective: No acute events. No complaints.  Objective: Filed Vitals:   03/05/14 1450 03/05/14 2007 03/05/14 2025 03/06/14 0638  BP: 171/73  176/65 153/70  Pulse: 82  79 72  Temp: 99.1 F (37.3 C)  98.3 F (36.8 C) 97.4 F (36.3 C)  TempSrc: Oral  Oral Axillary  Resp: $Remo'18  18 18  'cBKvT$ Height:      Weight:    71.5 kg (157 lb 10.1 oz)  SpO2: 96% 93% 96% 93%    Intake/Output Summary (Last 24 hours) at 03/06/14 0805 Last data filed at 03/05/14 2026  Gross per 24 hour  Intake    120 ml  Output   1800 ml  Net  -1680 ml   Filed Weights   03/04/14 0448  03/05/14 0500 03/06/14 0638  Weight: 73.3 kg (161 lb 9.6 oz) 75.7 kg (166 lb 14.2 oz) 71.5 kg (157 lb 10.1 oz)    Exam:   General:  Awake, in nad  Cardiovascular: regular, s1, s2  Respiratory: normal resp effort, no wheezing  Abdomen: soft, nondistended  Musculoskeletal: perfused, no clubbing   Data Reviewed: Basic Metabolic Panel:  Recent Labs Lab 03/02/14 0503 03/02/14 1818 03/03/14 0319 03/05/14 1358 03/06/14 0645  NA 141 140 141 142 140  K 4.5 4.2 4.2 4.0 4.1  CL 105 103 105 104 102  CO2 $Re'24 25 24 28 29  'sNL$ GLUCOSE 111* 116* 82 93 89  BUN 29* 29* 31* 18 18  CREATININE 1.34 1.25 1.23 0.78 0.90  CALCIUM 8.8 8.9 8.8 8.9 8.9  MG  --  2.0  --   --   --   PHOS  --  3.5  --   --   --    Liver Function Tests:  Recent Labs Lab 03/02/14 0503 03/02/14 1818 03/03/14 0319  AST $Re'26 22 18  'nYz$ ALT 9 12 <5  ALKPHOS 79 75 65  BILITOT 0.4 0.4 0.3  PROT 5.9* 6.0 5.2*  ALBUMIN 2.9* 2.7* 2.4*   No results found for this basename: LIPASE, AMYLASE,  in the last 168 hours No results found for this basename: AMMONIA,  in the last 168 hours CBC:  Recent Labs Lab 03/02/14 0503 03/02/14 2348 03/03/14 0319 03/05/14 1358  WBC 8.0 11.2* 10.7* 7.2  NEUTROABS  7.0 10.0*  --   --   HGB 12.5* 11.5* 11.4* 12.2*  HCT 39.0 35.2* 34.7* 36.9*  MCV 90.1 89.6 89.2 87.9  PLT 146* 136* 134* 167   Cardiac Enzymes:  Recent Labs Lab 03/02/14 0503  TROPONINI <0.30   BNP (last 3 results)  Recent Labs  03/02/14 0503  PROBNP 1416.0*   CBG:  Recent Labs Lab 03/03/14 0826 03/04/14 0745 03/06/14 0748  GLUCAP 87 94 95    Recent Results (from the past 240 hour(s))  CULTURE, BLOOD (ROUTINE X 2)     Status: None   Collection Time    03/02/14  5:03 AM      Result Value Ref Range Status   Specimen Description BLOOD RIGHT WRIST   Final   Special Requests BOTTLES DRAWN AEROBIC AND ANAEROBIC 5MLS   Final   Culture  Setup Time     Final   Value: 03/02/2014 11:18     Performed at  [facility]   Culture     Final   Value:        BLOOD CULTURE RECEIVED NO GROWTH TO DATE CULTURE WILL BE HELD FOR 5 DAYS BEFORE ISSUING A FINAL NEGATIVE REPORT     Performed at [facility]   Report Status PENDING   Incomplete  CULTURE, BLOOD (ROUTINE X 2)     Status: None   Collection Time    03/02/14  5:08 AM      Result Value Ref Range Status   Specimen Description BLOOD RIGHT ARM   Final   Special Requests BOTTLES DRAWN AEROBIC ONLY 5CCS   Final   Culture  Setup Time     Final   Value: 03/02/2014 11:18     Performed at [facility]   Culture     Final   Value:        BLOOD CULTURE RECEIVED NO GROWTH TO DATE CULTURE WILL BE HELD FOR 5 DAYS BEFORE ISSUING A FINAL NEGATIVE REPORT     Performed at [facility]   Report Status PENDING   Incomplete  URINE CULTURE     Status: None   Collection Time    03/02/14  8:32 AM      Result Value Ref Range Status   Specimen Description URINE, CATHETERIZED   Final   Special Requests NONE   Final   Culture  Setup Time     Final   Value: 03/02/2014 14:16     Performed at [place]     Final   Value: NO GROWTH     Performed at [facility]   Culture     Final   Value: NO GROWTH     Performed at [facility]   Report Status 03/03/2014 FINAL   Final  RESPIRATORY VIRUS PANEL     Status: Abnormal   Collection Time    03/02/14 10:49 AM      Result Value Ref Range Status   Source - [facility] NASAL SWAB   Corrected   Comment: CORRECTED ON 06/01 AT 1805: PREVIOUSLY REPORTED AS NASAL SWAB   Respiratory Syncytial Virus A NOT DETECTED   Final   Respiratory Syncytial Virus B NOT DETECTED   Final   Influenza A NOT DETECTED   Final   Influenza B NOT DETECTED   Final   Parainfluenza 1 NOT DETECTED   Final   Parainfluenza 2 NOT DETECTED   Final   Parainfluenza  3 NOT DETECTED   Final   Metapneumovirus DETECTED (*)  Final   Rhinovirus NOT DETECTED   Final   Adenovirus NOT DETECTED    Final   Influenza A H1 NOT DETECTED   Final   Influenza A H3 NOT DETECTED   Final   Comment: (NOTE)           Normal Reference Range for each Analyte: NOT DETECTED     Testing performed using the Luminex xTAG Respiratory Viral Panel test     kit.     This test was developed and its performance characteristics determined     by [facility]. It has not been cleared or approved by the [place]     Food and Drug Administration. This test is used for clinical purposes.     It should not be regarded as investigational or for research. This     laboratory is certified under the [place] (CLIA) as qualified to perform high complexity     clinical laboratory testing.     Performed at [facility] PCR SCREENING     Status: None   Collection Time    03/02/14  7:24 PM      Result Value Ref Range Status   MRSA by PCR NEGATIVE  NEGATIVE Final   Comment:            The GeneXpert MRSA Assay (FDA     approved for NASAL specimens     only), is one component of a     comprehensive MRSA colonization     surveillance program. It is not     intended to diagnose MRSA     infection nor to guide or     monitor treatment for     MRSA infections.  URINE CULTURE     Status: None   Collection Time    03/02/14  9:06 PM      Result Value Ref Range Status   Specimen Description URINE, RANDOM   Final   Special Requests NONE   Final   Culture  Setup Time     Final   Value: 03/03/2014 01:55     Performed at [place]     Final   Value: NO GROWTH     Performed at [facility]   Culture     Final   Value: NO GROWTH     Performed at [facility]   Report Status 03/04/2014 FINAL   Final     Studies: Dg Chest Port 1 View  03/05/2014   CLINICAL DATA:  Pneumonia, shortness of breath  EXAM: PORTABLE CHEST - 1 VIEW  COMPARISON:  03/02/2014  FINDINGS: Cardiomegaly evident with increased vascular congestion  and basilar airspace opacities versus edema. CHF is favored over pneumonia. No effusion or pneumothorax. Atherosclerosis of the aorta. Degenerative changes noted of these spine.  IMPRESSION: Cardiomegaly with increased vascular congestion and basilar airspace disease versus edema. CHF is favored over pneumonia.   Electronically Signed   By: Daryll Brod M.D.   On: 03/05/2014 11:39    Scheduled Meds: . amLODipine  5 mg Oral Daily  . antiseptic oral rinse  15 mL Mouth Rinse q12n4p  . aspirin EC  81 mg Oral Daily  . azithromycin  500 mg Oral Daily  . buPROPion  100 mg Oral BID  . carbidopa-levodopa  1 tablet Oral TID  .  cefTRIAXone (ROCEPHIN)  IV  1 g Intravenous Q24H  . chlorhexidine  15 mL Mouth Rinse BID  . donepezil  5 mg Oral Daily  . feeding supplement (ENSURE COMPLETE)  237 mL Oral BID BM  . furosemide  60 mg Intravenous Daily  . ipratropium-albuterol  3 mL Nebulization TID  . lisinopril  5 mg Oral Daily  . polyethylene glycol  17 g Oral Daily  . sodium chloride  3 mL Intravenous Q12H   Continuous Infusions: . sodium chloride Stopped (03/05/14 0700)    Principal Problem:   Acute respiratory failure with hypoxia Active Problems:   Hypertension   Dementia with behavioral disturbance   CAP (community acquired pneumonia)   Time spent: 59min   Stephen K Chiu  [facility] Pager [phone]. If 7PM-7AM, please contact night-coverage at [email or web address], password [facility] 03/06/2014, 8:05 AM  LOS: 4 days

## 2014-03-07 ENCOUNTER — Inpatient Hospital Stay (HOSPITAL_COMMUNITY): Payer: Federal, State, Local not specified - PPO

## 2014-03-07 LAB — BASIC METABOLIC PANEL
BUN: 22 mg/dL (ref 6–23)
CO2: 29 mEq/L (ref 19–32)
Calcium: 9 mg/dL (ref 8.4–10.5)
Chloride: 103 mEq/L (ref 96–112)
Creatinine, Ser: 0.93 mg/dL (ref 0.50–1.35)
GFR, EST AFRICAN AMERICAN: 84 mL/min — AB (ref >90)
GFR, EST NON AFRICAN AMERICAN: 72 mL/min — AB (ref >90)
Glucose, Bld: 86 mg/dL (ref 70–99)
POTASSIUM: 4 meq/L (ref 3.7–5.3)
Sodium: 142 mEq/L (ref 137–147)

## 2014-03-07 LAB — GLUCOSE, CAPILLARY: Glucose-Capillary: 91 mg/dL (ref 70–99)

## 2014-03-07 MED ORDER — ENOXAPARIN SODIUM 40 MG/0.4ML ~~LOC~~ SOLN
40.0000 mg | SUBCUTANEOUS | Status: DC
  Administered 2014-03-07: 40 mg via SUBCUTANEOUS
  Filled 2014-03-07 (×2): qty 0.4

## 2014-03-07 MED ORDER — LEVOFLOXACIN 500 MG PO TABS
500.0000 mg | ORAL_TABLET | Freq: Every day | ORAL | Status: DC
  Administered 2014-03-07 – 2014-03-08 (×2): 500 mg via ORAL
  Filled 2014-03-07 (×2): qty 1

## 2014-03-07 NOTE — Progress Notes (Signed)
MD Broadus John notified of patients low diastolic BP. MD verbal OK to give pt all his medications.

## 2014-03-07 NOTE — Evaluation (Signed)
Physical Therapy Evaluation Patient Details Name: [name] [name] MRN: [id number] DOB: [birth year] Today's Date: 03/07/2014   History of Present Illness  80 y.o. male admitted to [facility] on 03/02/14 with SOB.  Pt dx with acute respiratory failure due to CHF, PNA.  Pt with significant PMHx of Parkinson's, dementia, PTSD, orthostatic hypotension, A-fib, back pain, stroke, bil TKA, and left wrist surgery.   Clinical Impression  Pt is weak and deconditioned from being in the bed for 5 days now. He also has a very tender right forefoot with signs of trauma and bruising.  He is appropriate for SNF level rehab at d/c.   PT to follow acutely for deficits listed below.       Follow Up Recommendations SNF    Equipment Recommendations  Rolling walker with 5" wheels;Wheelchair (measurements PT);Wheelchair cushion (measurements PT)    Recommendations for Other Services   NA    Precautions / Restrictions Precautions Precautions: Fall      Mobility  Bed Mobility Overal bed mobility: Needs Assistance Bed Mobility: Supine to Sit;Sit to Supine     Supine to sit: Mod assist Sit to supine: Min assist   General bed mobility comments: Mod assist to support trunk to get to EOB.  Pt unable to scoot from middle of the bed to edge without significant help.  Sit to supine was better, but he used momentum and gravity to help get his trunk down and lift his legs up.  He needed mod assist to get repositioned in the bed.    Transfers Overall transfer level: Needs assistance Equipment used: Rolling walker (2 wheeled) Transfers: Sit to/from Stand Sit to Stand: Mod assist         General transfer comment: Stood multiple times EOB with RW.  Mod assist to support trunk during transitions. Pt reporting right foot pain (bruising and swollen-RN made aware MD paged).  Pt with significant posterior lean in standing.   Ambulation/Gait             General Gait Details: attempted to take steps with RW away from the  bed and pt unable.           Balance Overall balance assessment: Needs assistance Sitting-balance support: Feet supported;Bilateral upper extremity supported Sitting balance-Leahy Scale: Fair   Postural control: Posterior lean Standing balance support: Bilateral upper extremity supported Standing balance-Leahy Scale: Poor                               Pertinent Vitals/Pain O2 sats 90% on RA during mobility.      Home Living Family/patient expects to be discharged to:: Private residence Living Arrangements: Spouse/significant other               Additional Comments: pt is a poor historian    Prior Function                    Extremity/Trunk Assessment   Upper Extremity Assessment: Generalized weakness           Lower Extremity Assessment: Generalized weakness      Cervical / Trunk Assessment: Kyphotic  Communication   Communication: HOH  Cognition Arousal/Alertness: Awake/alert Behavior During Therapy: WFL for tasks assessed/performed Overall Cognitive Status: No family/caregiver present to determine baseline cognitive functioning                      General Comments General comments (skin integrity, edema,  etc.): O2 left off during session.  O2 sats 90% at lowest with PT.  RN made aware, O2 re-applied, but at 2 L instead of 3 L           Assessment/Plan    PT Assessment Patient needs continued PT services  PT Diagnosis Difficulty walking;Abnormality of gait;Generalized weakness;Acute pain;Altered mental status   PT Problem List Decreased strength;Decreased activity tolerance;Decreased mobility;Decreased balance;Decreased knowledge of use of DME;Cardiopulmonary status limiting activity;Pain  PT Treatment Interventions DME instruction;Gait training;Functional mobility training;Therapeutic activities;Therapeutic exercise;Balance training;Neuromuscular re-education;Cognitive remediation;Patient/family education   PT Goals  (Current goals can be found in the Care Plan section) Acute Rehab PT Goals Patient Stated Goal: to decrase pain in his right foot PT Goal Formulation: Patient unable to participate in goal setting Time For Goal Achievement: 03/21/14 Potential to Achieve Goals: Good    Frequency Min 2X/week    End of Session Equipment Utilized During Treatment: Gait belt Activity Tolerance: Patient limited by pain;Patient limited by fatigue Patient left: in bed;with call bell/phone within reach Nurse Communication: Mobility status         Time: [id number] PT Time Calculation (min): 18 min   Charges:   PT Evaluation $Initial PT Evaluation Tier I: 1 Procedure PT Treatments $Therapeutic Activity: 8-22 mins        Traver Meckes B. [place], [place], DPT [phone]   03/07/2014, 5:55 PM

## 2014-03-07 NOTE — Progress Notes (Signed)
[facility] PROGRESS NOTE  [name] [name] LNL:[id number] DOB: 07-Dec-1922 DOA: 03/02/2014 PCP: Mathews Argyle, MD   80 year old male, world war veteran with past medical history significant for hypertension, dementia who presented to [facility] ED 03/02/2014 due to worsening shortness of breath started 2 days prior to this admission. Initially treated for pneumonia, subsequent CXR done due to worsening hypoxia showed CHF now being diuresed  Assessment/Plan: Acute respiratory failure secondary to Sepsis/CAP and Diastolic CHF -Improving -wean O2 -Change Abx to Po levaquin for 3 more days -blood culture negative, Urine strep pneumonia and Legionella negative,  influenza panel negative, FU resp panel  Acute Diastolic CHF -improving with diuresis -ECHO with EF 55%, LVH and grade 1 DD -continue IV lasix today, change to Po tomorrow -Follow I/Os, weights  Hypertension  -continue amlodipine and lisinopril  Dementia  -Continue Aricept and Sinemet  -Continue Wellbutrin  Parkinson's disease -continue Sinemet  DYsphagia -s/p speech eval on D1 diet  DVT Prophylaxis-lovenox  Ambulate, Pt/OT, OOB  Code Status: Full Family Communication: Pt and family in room Disposition Plan: Pending   Consultants:    Procedures:    Antibiotics:  Azithromycin 5/29>>>  Rocephin 5/29>>>  HPI/Subjective: No acute events, feels a little better  Objective: Filed Vitals:   03/07/14 0533 03/07/14 0624 03/07/14 1019 03/07/14 1045  BP: 175/66 151/55 130/50 142/52  Pulse: 68  77   Temp: 98.6 F (37 C)     TempSrc: Oral     Resp: 20     Height:      Weight:      SpO2: 93%       Intake/Output Summary (Last 24 hours) at 03/07/14 1258 Last data filed at 03/07/14 0500  Gross per 24 hour  Intake    360 ml  Output   1750 ml  Net  -1390 ml   Filed Weights   03/04/14 0448 03/05/14 0500 03/06/14 0638  Weight: 73.3 kg (161 lb 9.6 oz) 75.7 kg (166 lb 14.2 oz) 71.5 kg (157 lb 10.1 oz)     Exam:   General:  Awake, in nad  Cardiovascular: regular, s1, s2  Respiratory: normal resp effort, no wheezing  Abdomen: soft, nondistended  Musculoskeletal: perfused, no clubbing   Data Reviewed: Basic Metabolic Panel:  Recent Labs Lab 03/02/14 0503 03/02/14 1818 03/03/14 0319 03/05/14 1358 03/06/14 0645 03/07/14 0625  NA 141 140 141 142 140 142  K 4.5 4.2 4.2 4.0 4.1 4.0  CL 105 103 105 104 102 103  CO2 $Re'24 25 24 28 29 29  'iNG$ GLUCOSE 111* 116* 82 93 89 86  BUN 29* 29* 31* $Remov'18 18 22  'qbRToV$ CREATININE 1.34 1.25 1.23 0.78 0.90 0.93  CALCIUM 8.8 8.9 8.8 8.9 8.9 9.0  MG  --  2.0  --   --   --   --   PHOS  --  3.5  --   --   --   --    Liver Function Tests:  Recent Labs Lab 03/02/14 0503 03/02/14 1818 03/03/14 0319  AST $Re'26 22 18  'vjj$ ALT 9 12 <5  ALKPHOS 79 75 65  BILITOT 0.4 0.4 0.3  PROT 5.9* 6.0 5.2*  ALBUMIN 2.9* 2.7* 2.4*   No results found for this basename: LIPASE, AMYLASE,  in the last 168 hours No results found for this basename: AMMONIA,  in the last 168 hours CBC:  Recent Labs Lab 03/02/14 0503 03/02/14 2348 03/03/14 0319 03/05/14 1358  WBC 8.0 11.2* 10.7* 7.2  NEUTROABS 7.0 10.0*  --   --  HGB 12.5* 11.5* 11.4* 12.2*  HCT 39.0 35.2* 34.7* 36.9*  MCV 90.1 89.6 89.2 87.9  PLT 146* 136* 134* 167   Cardiac Enzymes:  Recent Labs Lab 03/02/14 0503  TROPONINI <0.30   BNP (last 3 results)  Recent Labs  03/02/14 0503  PROBNP 1416.0*   CBG:  Recent Labs Lab 03/03/14 0826 03/04/14 0745 03/06/14 0748 03/07/14 0824  GLUCAP 87 94 95 91    Recent Results (from the past 240 hour(s))  CULTURE, BLOOD (ROUTINE X 2)     Status: None   Collection Time    03/02/14  5:03 AM      Result Value Ref Range Status   Specimen Description BLOOD RIGHT WRIST   Final   Special Requests BOTTLES DRAWN AEROBIC AND ANAEROBIC 5MLS   Final   Culture  Setup Time     Final   Value: 03/02/2014 11:18     Performed at [facility]   Culture     Final    Value:        BLOOD CULTURE RECEIVED NO GROWTH TO DATE CULTURE WILL BE HELD FOR 5 DAYS BEFORE ISSUING A FINAL NEGATIVE REPORT     Performed at [facility]   Report Status PENDING   Incomplete  CULTURE, BLOOD (ROUTINE X 2)     Status: None   Collection Time    03/02/14  5:08 AM      Result Value Ref Range Status   Specimen Description BLOOD RIGHT ARM   Final   Special Requests BOTTLES DRAWN AEROBIC ONLY 5CCS   Final   Culture  Setup Time     Final   Value: 03/02/2014 11:18     Performed at [facility]   Culture     Final   Value:        BLOOD CULTURE RECEIVED NO GROWTH TO DATE CULTURE WILL BE HELD FOR 5 DAYS BEFORE ISSUING A FINAL NEGATIVE REPORT     Performed at [facility]   Report Status PENDING   Incomplete  URINE CULTURE     Status: None   Collection Time    03/02/14  8:32 AM      Result Value Ref Range Status   Specimen Description URINE, CATHETERIZED   Final   Special Requests NONE   Final   Culture  Setup Time     Final   Value: 03/02/2014 14:16     Performed at [place]     Final   Value: NO GROWTH     Performed at [facility]   Culture     Final   Value: NO GROWTH     Performed at [facility]   Report Status 03/03/2014 FINAL   Final  RESPIRATORY VIRUS PANEL     Status: Abnormal   Collection Time    03/02/14 10:49 AM      Result Value Ref Range Status   Source - [facility] NASAL SWAB   Corrected   Comment: CORRECTED ON 06/01 AT 1805: PREVIOUSLY REPORTED AS NASAL SWAB   Respiratory Syncytial Virus A NOT DETECTED   Final   Respiratory Syncytial Virus B NOT DETECTED   Final   Influenza A NOT DETECTED   Final   Influenza B NOT DETECTED   Final   Parainfluenza 1 NOT DETECTED   Final   Parainfluenza 2 NOT DETECTED   Final   Parainfluenza 3 NOT DETECTED   Final  Metapneumovirus DETECTED (*)  Final   Rhinovirus NOT DETECTED   Final   Adenovirus NOT DETECTED   Final   Influenza A H1 NOT DETECTED   Final    Influenza A H3 NOT DETECTED   Final   Comment: (NOTE)           Normal Reference Range for each Analyte: NOT DETECTED     Testing performed using the Luminex xTAG Respiratory Viral Panel test     kit.     This test was developed and its performance characteristics determined     by [facility]. It has not been cleared or approved by the [place]     Food and Drug Administration. This test is used for clinical purposes.     It should not be regarded as investigational or for research. This     laboratory is certified under the [place] (CLIA) as qualified to perform high complexity     clinical laboratory testing.     Performed at [facility] [facility] PCR SCREENING     Status: None   Collection Time    03/02/14  7:24 PM      Result Value Ref Range Status   MRSA by PCR NEGATIVE  NEGATIVE Final   Comment:            The GeneXpert MRSA Assay (FDA     approved for NASAL specimens     only), is one component of a     comprehensive MRSA colonization     surveillance program. It is not     intended to diagnose MRSA     infection nor to guide or     monitor treatment for     MRSA infections.  URINE CULTURE     Status: None   Collection Time    03/02/14  9:06 PM      Result Value Ref Range Status   Specimen Description URINE, RANDOM   Final   Special Requests NONE   Final   Culture  Setup Time     Final   Value: 03/03/2014 01:55     Performed at [place]     Final   Value: NO GROWTH     Performed at [facility]   Culture     Final   Value: NO GROWTH     Performed at [facility]   Report Status 03/04/2014 FINAL   Final     Studies: No results found.  Scheduled Meds: . amLODipine  5 mg Oral Daily  . antiseptic oral rinse  15 mL Mouth Rinse q12n4p  . aspirin EC  81 mg Oral Daily  . buPROPion  100 mg Oral BID  . carbidopa-levodopa  1 tablet Oral TID  . chlorhexidine  15 mL  Mouth Rinse BID  . donepezil  5 mg Oral Daily  . feeding supplement (ENSURE COMPLETE)  237 mL Oral BID BM  . furosemide  60 mg Intravenous Daily  . levofloxacin  500 mg Oral Daily  . lisinopril  5 mg Oral Daily  . polyethylene glycol  17 g Oral Daily  . sodium chloride  3 mL Intravenous Q12H   Continuous Infusions: . sodium chloride Stopped (03/05/14 0700)    Principal Problem:   Acute respiratory failure with hypoxia Active Problems:   Hypertension   Dementia with behavioral disturbance   CAP (community acquired pneumonia)  Time spent: 48min   Chevelle Coulson  [facility] Pager [phone]. If 7PM-7AM, please contact night-coverage at [email or web address], password [facility] 03/07/2014, 12:58 PM  LOS: 5 days

## 2014-03-07 NOTE — Progress Notes (Signed)
Patient complained of pain to right foot with weight/pressure. MD Broadus John notified. Orders received

## 2014-03-07 NOTE — Progress Notes (Signed)
NUTRITION FOLLOW UP  Intervention:   - Ensure Complete BID - RD to continue to monitor   Nutrition Dx:   Inadequate oral intake related to poor appetite, lethargy, dementia as evidenced by 25% meal intake - ongoing   Goal:   Pt to consume >90% of meals/supplements - not met  Monitor:   Weights, labs, intake  Assessment:   Pt with past medical history significant for hypertension, dementia who presented to [facility] ED 03/02/2014 due to worsening shortness of breath started 2 days prior to this admission. Has been followed by outpatient and inpatient SLP. On dysphagia 3/thin liquid diet.   5/30: -Pt asleep, wife provided history  -She reports pt eating soft/mashed/moist foods at home, 2 meals/day breakfast and dinner, usually only a snack for lunch as pt does not eat well for caregiver but eats well during meals wife is present for  -Wife uses food processor to blend foods. Pt on thin liquids at home and during admission.  -Drinks 2 protein shakes/day from [facility], wife unsure of name or protein content  -Usual body weight 170 pounds, currently 155 pounds, past weight trend shows weight down only 8 pounds in the past 7 months, not significant  -PO intake during admission 25%, wife agreeable to pt getting Ensure Complete  6/3: -Pt reports minimal intake however he is eating what he wants to -Family interested in pt getting Ensure Complete, will order    Height: Ht Readings from Last 1 Encounters:  03/02/14 $RemoveB'5\' 8"'[id number]$  (1.727 m)    Weight Status:   Wt Readings from Last 1 Encounters:  03/06/14 157 lb 10.1 oz (71.5 kg)  Admit              155 lb 3.3 oz (70.4 kg)    Re-estimated needs:  Kcal: 1750-1950  Protein: 85-105g  Fluid: 1.7-1.9L/day   Skin: RUE  Diet Order: Dysphagia 1, thin   Intake/Output Summary (Last 24 hours) at 03/07/14 1620 Last data filed at 03/07/14 1413  Gross per 24 hour  Intake    684 ml  Output   2150 ml  Net  -1466 ml    Last BM: 6/1   Labs:   Recent  Labs Lab 03/02/14 0503 03/02/14 1818  03/05/14 1358 03/06/14 0645 03/07/14 0625  NA 141 140  < > 142 140 142  K 4.5 4.2  < > 4.0 4.1 4.0  CL 105 103  < > 104 102 103  CO2 24 25  < > $R'28 29 29  'pF$ BUN 29* 29*  < > $R'18 18 22  'OX$ CREATININE 1.34 1.25  < > 0.78 0.90 0.93  CALCIUM 8.8 8.9  < > 8.9 8.9 9.0  MG  --  2.0  --   --   --   --   PHOS  --  3.5  --   --   --   --   GLUCOSE 111* 116*  < > 93 89 86  < > = values in this interval not displayed.  CBG (last 3)   Recent Labs  03/06/14 0748 03/07/14 0824  GLUCAP 95 91    Scheduled Meds: . amLODipine  5 mg Oral Daily  . antiseptic oral rinse  15 mL Mouth Rinse q12n4p  . aspirin EC  81 mg Oral Daily  . buPROPion  100 mg Oral BID  . carbidopa-levodopa  1 tablet Oral TID  . chlorhexidine  15 mL Mouth Rinse BID  . donepezil  5 mg Oral Daily  .  enoxaparin (LOVENOX) injection  40 mg Subcutaneous Q24H  . feeding supplement (ENSURE COMPLETE)  237 mL Oral BID BM  . furosemide  60 mg Intravenous Daily  . levofloxacin  500 mg Oral Daily  . lisinopril  5 mg Oral Daily  . polyethylene glycol  17 g Oral Daily  . sodium chloride  3 mL Intravenous Q12H    [name] Stable MS, RD, [place] [phone] Pager [phone] Weekend/After Hours Pager

## 2014-03-08 LAB — CBC
HEMATOCRIT: 35.3 % — AB (ref 39.0–52.0)
Hemoglobin: 11.7 g/dL — ABNORMAL LOW (ref 13.0–17.0)
MCH: 29.5 pg (ref 26.0–34.0)
MCHC: 33.1 g/dL (ref 30.0–36.0)
MCV: 88.9 fL (ref 78.0–100.0)
Platelets: 224 10*3/uL (ref 150–400)
RBC: 3.97 MIL/uL — ABNORMAL LOW (ref 4.22–5.81)
RDW: 14.6 % (ref 11.5–15.5)
WBC: 8.3 10*3/uL (ref 4.0–10.5)

## 2014-03-08 LAB — CULTURE, BLOOD (ROUTINE X 2)
Culture: NO GROWTH
Culture: NO GROWTH

## 2014-03-08 LAB — BASIC METABOLIC PANEL
BUN: 31 mg/dL — AB (ref 6–23)
CHLORIDE: 102 meq/L (ref 96–112)
CO2: 32 meq/L (ref 19–32)
CREATININE: 1.1 mg/dL (ref 0.50–1.35)
Calcium: 9.1 mg/dL (ref 8.4–10.5)
GFR calc Af Amer: 67 mL/min — ABNORMAL LOW (ref >90)
GFR calc non Af Amer: 57 mL/min — ABNORMAL LOW (ref >90)
Glucose, Bld: 90 mg/dL (ref 70–99)
Potassium: 3.8 mEq/L (ref 3.7–5.3)
Sodium: 144 mEq/L (ref 137–147)

## 2014-03-08 LAB — GLUCOSE, CAPILLARY: Glucose-Capillary: 115 mg/dL — ABNORMAL HIGH (ref 70–99)

## 2014-03-08 MED ORDER — LEVOFLOXACIN 500 MG PO TABS: 500.0000 mg | ORAL_TABLET | Freq: Every day | ORAL | Status: DC

## 2014-03-08 MED ORDER — FUROSEMIDE 40 MG PO TABS: 40.0000 mg | ORAL_TABLET | Freq: Every day | ORAL | Status: DC

## 2014-03-08 MED ORDER — SENNOSIDES-DOCUSATE SODIUM 8.6-50 MG PO TABS: 1.0000 | ORAL_TABLET | Freq: Every evening | ORAL | Status: DC | PRN

## 2014-03-08 NOTE — Discharge Summary (Signed)
Physician Discharge Summary  [name] [name] ZOX:[id number] DOB: [birth year] DOA: 03/02/2014  PCP: Mathews Argyle, MD  Admit date: 03/02/2014 Discharge date: 03/08/2014  Time spent: 45 minutes  Recommendations for Outpatient Follow-up:  1. PCP in 1 week 2. Bmet in 1week 3. Needs ongoing discussion regarding Palliative care  Discharge Diagnoses:  Principal Problem:   Acute respiratory failure with hypoxia Active Problems:   Hypertension   Dementia with behavioral disturbance   CAP (community acquired pneumonia)   Parkinsons disease   Dysphagia   Deconditioning  Discharge Condition: stable  Diet recommendation: Dysphagia 1 diet with thin liquids  Filed Weights   03/05/14 0500 03/06/14 0638 03/08/14 0540  Weight: 75.7 kg (166 lb 14.2 oz) 71.5 kg (157 lb 10.1 oz) 73.5 kg (162 lb 0.6 oz)    History of present illness:  80 year old male, world war veteran with past medical history significant for hypertension, dementia who presented to [facility] ED 03/02/2014 due to worsening shortness of breath started 2 days prior to this admission. [name] at the bedside provided the history because patient is lethargic. She reports she recently had a upper respiratory viral infection when seen by her PCP. She thinks that her husband has gotten viral infection from heart. She noted 2 days ago that his breathing was different and he used more stomach muscles to breathe while his breathing was shallow. There was no respiratory stridor. Patient did not complain of chest pain. There was no cough or fever. Patient was mostly resting but even with resting she noted his breathing was different. No complaints of abdominal pain or nausea or vomiting. No diarrhea. No reports of blood in the stool or urine. No loss of consciousness.  In ED, blood pressure was 108/56 and then 154/48, heart rate 59-70, T max 100.7 F and oxygen saturation 91% with nasal cannula oxygen support. He is now saturating 97% with  Ventimask. His chest x-ray showed possible mild pneumonia  Hospital Course:   Acute respiratory failure secondary to Sepsis/CAP and Diastolic CHF  -Improved, unable to completely wean of O2 by discharge -was on broad spectrum Abx initially and then changed to PO levaquin -Continue PO levaquin for 3 more days  -blood culture negative, Urine strep pneumonia and Legionella negative, influenza panel negative  Acute Diastolic CHF  -improving with diuresis  -ECHO with EF 55%, LVH and grade 1 DD  -changed to Po Lasix at discharge -monitor weight at SNF -Bmet in 1 week   Hypertension  -continue amlodipine and lisinopril   Dementia  -Continue Aricept and Sinemet  -Continue Wellbutrin   Parkinson's disease  -continue Sinemet   DYsphagia  -s/p speech eval on D1 diet   ETHICS: would encourage PCP to initiate discussion regarding Palliative care given advanced age, Dementia and Parkinson's disease  Discharge Exam: Filed Vitals:   03/08/14 0931  BP: 129/55  Pulse: 65  Temp:   Resp:     General: AAOx to self and place Cardiovascular: S1S2/RRR Respiratory: scattered ronchi  Discharge Instructions You were cared for by a hospitalist during your hospital stay. If you have any questions about your discharge medications or the care you received while you were in the hospital after you are discharged, you can call the unit and asked to speak with the hospitalist on call if the hospitalist that took care of you is not available. Once you are discharged, your primary care physician will handle any further medical issues. Please note that NO REFILLS for any discharge medications will be  authorized once you are discharged, as it is imperative that you return to your primary care physician (or establish a relationship with a primary care physician if you do not have one) for your aftercare needs so that they can reassess your need for medications and monitor your lab values.  Discharge  Instructions   Diet - low sodium heart healthy    Complete by:  As directed      Increase activity slowly    Complete by:  As directed             Medication List         acetaminophen 325 MG tablet  Commonly known as:  TYLENOL  Take 2 tablets (650 mg total) by mouth every 6 (six) hours as needed (or Fever >/= 101).     amLODipine 5 MG tablet  Commonly known as:  NORVASC  Take 5 mg by mouth daily.     aspirin 81 MG tablet  Take 81 mg by mouth daily.     buPROPion 100 MG 12 hr tablet  Commonly known as:  WELLBUTRIN SR  Take 100 mg by mouth 2 (two) times daily.     carbidopa-levodopa 25-100 MG per tablet  Commonly known as:  SINEMET IR  Take 1 tablet by mouth 3 (three) times daily.     donepezil 10 MG tablet  Commonly known as:  ARICEPT  Take 5 mg by mouth daily.     furosemide 40 MG tablet  Commonly known as:  LASIX  Take 1 tablet (40 mg total) by mouth daily.     levofloxacin 500 MG tablet  Commonly known as:  LEVAQUIN  Take 1 tablet (500 mg total) by mouth daily. For 3 days     lisinopril 5 MG tablet  Commonly known as:  PRINIVIL,ZESTRIL  Take 5 mg by mouth daily as needed. For blood pressure greater then 150     senna-docusate 8.6-50 MG per tablet  Commonly known as:  Senokot-S  Take 1 tablet by mouth at bedtime as needed for mild constipation.       Allergies  Allergen Reactions  . Aceon [Perindopril]     diarrhea  . Morphine And Related     Causes confusion      The results of significant diagnostics from this hospitalization (including imaging, microbiology, ancillary and laboratory) are listed below for reference.    Significant Diagnostic Studies: Dg Chest Port 1 View  03/05/2014   CLINICAL DATA:  Pneumonia, shortness of breath  EXAM: PORTABLE CHEST - 1 VIEW  COMPARISON:  03/02/2014  FINDINGS: Cardiomegaly evident with increased vascular congestion and basilar airspace opacities versus edema. CHF is favored over pneumonia. No effusion or  pneumothorax. Atherosclerosis of the aorta. Degenerative changes noted of these spine.  IMPRESSION: Cardiomegaly with increased vascular congestion and basilar airspace disease versus edema. CHF is favored over pneumonia.   Electronically Signed   By: Daryll Brod M.D.   On: 03/05/2014 11:39   Dg Chest Port 1 View  03/02/2014   CLINICAL DATA:  Fever and shortness of breath.  Sepsis.  EXAM: PORTABLE CHEST - 1 VIEW  COMPARISON:  Chest radiograph performed 08/04/2012  FINDINGS: The lungs are well-aerated. Mild bibasilar airspace opacities raise concern for mild pneumonia. Mild vascular congestion is noted. There is no evidence of pleural effusion or pneumothorax.  The cardiomediastinal silhouette is borderline normal in size. No acute osseous abnormalities are seen. There is chronic deformity of the proximal left humerus.  IMPRESSION:  Mild bibasilar airspace assess raise concern for mild pneumonia. Vascular congestion noted.   Electronically Signed   By: Garald Balding M.D.   On: 03/02/2014 05:23   Dg Foot Complete Right  03/07/2014   CLINICAL DATA:  Right foot injury.  EXAM: RIGHT FOOT COMPLETE - 3+ VIEW  COMPARISON:  None.  FINDINGS: No acute fracture or dislocation is identified. Degenerative changes are present in all of the toes and involving tarsal bones. There are arterial vascular calcifications present. No bony lesions or destruction. Soft tissues show no foreign body.  IMPRESSION: No acute fracture. Degenerative changes are present throughout the toes.   Electronically Signed   By: Aletta Edouard M.D.   On: 03/07/2014 21:16    Microbiology: Recent Results (from the past 240 hour(s))  CULTURE, BLOOD (ROUTINE X 2)     Status: None   Collection Time    03/02/14  5:03 AM      Result Value Ref Range Status   Specimen Description BLOOD RIGHT WRIST   Final   Special Requests BOTTLES DRAWN AEROBIC AND ANAEROBIC 5MLS   Final   Culture  Setup Time     Final   Value: 03/02/2014 11:18     Performed at  [facility]   Culture     Final   Value: NO GROWTH 5 DAYS     Performed at [facility]   Report Status 03/08/2014 FINAL   Final  CULTURE, BLOOD (ROUTINE X 2)     Status: None   Collection Time    03/02/14  5:08 AM      Result Value Ref Range Status   Specimen Description BLOOD RIGHT ARM   Final   Special Requests BOTTLES DRAWN AEROBIC ONLY 5CCS   Final   Culture  Setup Time     Final   Value: 03/02/2014 11:18     Performed at [facility]   Culture     Final   Value: NO GROWTH 5 DAYS     Performed at [facility]   Report Status 03/08/2014 FINAL   Final  URINE CULTURE     Status: None   Collection Time    03/02/14  8:32 AM      Result Value Ref Range Status   Specimen Description URINE, CATHETERIZED   Final   Special Requests NONE   Final   Culture  Setup Time     Final   Value: 03/02/2014 14:16     Performed at [place]     Final   Value: NO GROWTH     Performed at [facility]   Culture     Final   Value: NO GROWTH     Performed at [facility]   Report Status 03/03/2014 FINAL   Final  RESPIRATORY VIRUS PANEL     Status: Abnormal   Collection Time    03/02/14 10:49 AM      Result Value Ref Range Status   Source - RVPAN NASAL SWAB   Corrected   Comment: CORRECTED ON 06/01 AT 1805: PREVIOUSLY REPORTED AS NASAL SWAB   Respiratory Syncytial Virus A NOT DETECTED   Final   Respiratory Syncytial Virus B NOT DETECTED   Final   Influenza A NOT DETECTED   Final   Influenza B NOT DETECTED   Final   Parainfluenza 1 NOT DETECTED   Final   Parainfluenza 2 NOT DETECTED   Final  Parainfluenza 3 NOT DETECTED   Final   Metapneumovirus DETECTED (*)  Final   Rhinovirus NOT DETECTED   Final   Adenovirus NOT DETECTED   Final   Influenza A H1 NOT DETECTED   Final   Influenza A H3 NOT DETECTED   Final   Comment: (NOTE)           Normal Reference Range for each Analyte: NOT DETECTED     Testing performed  using the Luminex xTAG Respiratory Viral Panel test     kit.     This test was developed and its performance characteristics determined     by [facility]. It has not been cleared or approved by the [place]     Food and Drug Administration. This test is used for clinical purposes.     It should not be regarded as investigational or for research. This     laboratory is certified under the [place] (CLIA) as qualified to perform high complexity     clinical laboratory testing.     Performed at [facility] PCR SCREENING     Status: None   Collection Time    03/02/14  7:24 PM      Result Value Ref Range Status   MRSA by PCR NEGATIVE  NEGATIVE Final   Comment:            The GeneXpert MRSA Assay (FDA     approved for NASAL specimens     only), is one component of a     comprehensive MRSA colonization     surveillance program. It is not     intended to diagnose MRSA     infection nor to guide or     monitor treatment for     MRSA infections.  URINE CULTURE     Status: None   Collection Time    03/02/14  9:06 PM      Result Value Ref Range Status   Specimen Description URINE, RANDOM   Final   Special Requests NONE   Final   Culture  Setup Time     Final   Value: 03/03/2014 01:55     Performed at [place]     Final   Value: NO GROWTH     Performed at [facility]   Culture     Final   Value: NO GROWTH     Performed at [facility]   Report Status 03/04/2014 FINAL   Final     Labs: Basic Metabolic Panel:  Recent Labs Lab 03/02/14 0503 03/02/14 1818 03/03/14 0319 03/05/14 1358 03/06/14 0645 03/07/14 0625 03/08/14 0522  NA 141 140 141 142 140 142 144  K 4.5 4.2 4.2 4.0 4.1 4.0 3.8  CL 105 103 105 104 102 103 102  CO2 $Re'24 25 24 28 29 29 'MWi$ 32  GLUCOSE 111* 116* 82 93 89 86 90  BUN 29* 29* 31* $Remov'18 18 22 'fCgzOM$ 31*  CREATININE 1.34 1.25 1.23 0.78 0.90 0.93 1.10  CALCIUM 8.8  8.9 8.8 8.9 8.9 9.0 9.1  MG  --  2.0  --   --   --   --   --   PHOS  --  3.5  --   --   --   --   --    Liver Function Tests:  Recent Labs Lab 03/02/14 0503 03/02/14 1818 03/03/14 0319  AST '26 22 18  '$ ALT 9 12 <5  ALKPHOS 79 75 65  BILITOT 0.4 0.4 0.3  PROT 5.9* 6.0 5.2*  ALBUMIN 2.9* 2.7* 2.4*   No results found for this basename: LIPASE, AMYLASE,  in the last 168 hours No results found for this basename: AMMONIA,  in the last 168 hours CBC:  Recent Labs Lab 03/02/14 0503 03/02/14 2348 03/03/14 0319 03/05/14 1358 03/08/14 0522  WBC 8.0 11.2* 10.7* 7.2 8.3  NEUTROABS 7.0 10.0*  --   --   --   HGB 12.5* 11.5* 11.4* 12.2* 11.7*  HCT 39.0 35.2* 34.7* 36.9* 35.3*  MCV 90.1 89.6 89.2 87.9 88.9  PLT 146* 136* 134* 167 224   Cardiac Enzymes:  Recent Labs Lab 03/02/14 0503  TROPONINI <0.30   BNP: BNP (last 3 results)  Recent Labs  03/02/14 0503  PROBNP 1416.0*   CBG:  Recent Labs Lab 03/03/14 0826 03/04/14 0745 03/06/14 0748 03/07/14 0824 03/08/14 0802  GLUCAP 87 94 95 91 115*       Signed:  Domenic Polite  [facility] 03/08/2014, 11:38 AM

## 2014-03-08 NOTE — Progress Notes (Signed)
Speech Language Pathology Treatment: Dysphagia- Late Entry for 6/3 Patient Details Name: [name] [name] MRN: [id number] DOB: [birth year] Today's Date: 03/08/2014 Time: 2202-5427 SLP Time Calculation (min): 24 min  Assessment / Plan / Recommendation Clinical Impression  Pt. observed with thin liquids via cup with wife at bedside.  He exhibited delayed throat clear and suspected delayed swallow initiation and required moderate verbal/visual cues to perform intermittent throat clear.  Pt. Is at increased aspiration risk after MBS results of trace aspiration with thin (thicker liquids increased residuals).  Swallow precautions will be important to mitigate risk.   HPI HPI: 80 year old male, world war veteran with past medical history significant for hypertension, dementia who presented to [facility] ED 03/02/2014 due to worsening shortness of breath started 2 days prior to this admission. [name] at the bedside provided the history because patient is lethargic. She reports she recently had a upper respiratory viral infection when seen by her PCP. Chest x-ray done on admission showed air space disease concerning for possible mild pneumonia. Pt had an outpatient MBS on 01/30/14 recommended Dys 3/thin. Pt with standing secretions, weak swallow and decreased UES opening. Pt had trace aspriation of thin, but thickener increased residuals.    Pertinent Vitals WDL  SLP Plan  Continue with current plan of care    Recommendations Diet recommendations: Dysphagia 1 (puree);Thin liquid Liquids provided via: Cup;No straw Medication Administration: Whole meds with puree Supervision: Staff to assist with self feeding;Trained caregiver to feed patient;Full supervision/cueing for compensatory strategies Compensations: Slow rate;Small sips/bites;Clear throat intermittently Postural Changes and/or Swallow Maneuvers: Seated upright 90 degrees              Oral Care Recommendations: Oral care BID Follow up  Recommendations: Skilled Nursing facility Plan: Continue with current plan of care    GO     Houston Siren M.Ed [employer] [phone]  03/08/2014

## 2014-03-08 NOTE — Care Management Note (Signed)
    Page 1 of 1   03/08/2014     2:20:26 PM CARE MANAGEMENT NOTE 03/08/2014  Patient:  KORE, MADLOCK   Account Number:  [id number]  Date Initiated:  03/08/2014  Documentation initiated by:  Tomi Bamberger  Subjective/Objective Assessment:   dx pna  admit- lives with spouse.     Action/Plan:   Anticipated DC Date:  03/08/2014   Anticipated DC Plan:  SKILLED NURSING FACILITY  In-house referral  Clinical Social Worker      DC Planning Services  CM consult      Choice offered to / List presented to:             Status of service:  Completed, signed off Medicare Important Message given?   (If response is "NO", the following Medicare IM given date fields will be blank) Date Medicare IM given:   Date Additional Medicare IM given:    Discharge Disposition:  [facility]  Per UR Regulation:  Reviewed for med. necessity/level of care/duration of stay  If discussed at [place] of Stay Meetings, dates discussed:    Comments:

## 2014-03-08 NOTE — Progress Notes (Signed)
Speech Language Pathology Treatment: Dysphagia  Patient Details Name: [name] [name] MRN: [id number] DOB: May 02, 1923 Today's Date: 03/08/2014 Time: 4098-1191 SLP Time Calculation (min): 8 min  Assessment / Plan / Recommendation Clinical Impression  Education reviewed with pt. And wife regarding swallow precautions prior to discharge to SNF today.  No po observation due to recent completion of lunch and dentures cleaned.  Wife voiced understanding of precautions and recommendation of continue ST at SNF.   HPI HPI: 80 year old male, world war veteran with past medical history significant for hypertension, dementia who presented to [facility] ED 03/02/2014 due to worsening shortness of breath started 2 days prior to this admission. [name] at the bedside provided the history because patient is lethargic. She reports she recently had a upper respiratory viral infection when seen by her PCP. Chest x-ray done on admission showed air space disease concerning for possible mild pneumonia. Pt had an outpatient MBS on 01/30/14 recommended Dys 3/thin. Pt with standing secretions, weak swallow and decreased UES opening. Pt had trace aspriation of thin, but thickener increased residuals.    Pertinent Vitals WDL  SLP Plan  Other (Comment) (pt. going to SNF today)    Recommendations Diet recommendations: Dysphagia 1 (puree);Thin liquid Liquids provided via: Cup;No straw Medication Administration: Whole meds with puree Supervision: Staff to assist with self feeding;Trained caregiver to feed patient;Full supervision/cueing for compensatory strategies Compensations: Slow rate;Small sips/bites;Clear throat intermittently Postural Changes and/or Swallow Maneuvers: Seated upright 90 degrees              Oral Care Recommendations: Oral care BID Follow up Recommendations: Skilled Nursing facility Plan: Other (Comment) (pt. going to SNF today)    GO     [place] Mon.Ed [employer]  [phone]  03/08/2014

## 2014-03-08 NOTE — Evaluation (Signed)
Occupational Therapy Evaluation Patient Details Name: [name] [name] MRN: [id number] DOB: 02-05-1923 Today's Date: 03/08/2014    History of Present Illness 80 y.o. male admitted to [facility] on 03/02/14 with SOB.  Pt dx with acute respiratory failure due to CHF, PNA.  Pt with significant PMHx of Parkinson's, dementia, PTSD, orthostatic hypotension, A-fib, back pain, stroke, bil TKA, and left wrist surgery.    Clinical Impression   PT admitted with SOB. Pt currently with functional limitiations due to the deficits listed below (see OT problem list). Pt has (A) of wife at home. Pt will benefit from skilled OT to increase their independence and safety with adls and balance to allow discharge [place]. Spoke directly with wife regarding patients current transfer ability and she reports being able to manage at home. Based on wife's report HHOT is recommmend at this time.     Follow Up Recommendations  Home health OT;Supervision/Assistance - 24 hour    Equipment Recommendations  None recommended by OT    Recommendations for Other Services       Precautions / Restrictions Precautions Precautions: Fall      Mobility Bed Mobility Overal bed mobility: Needs Assistance Bed Mobility: Supine to Sit     Supine to sit: Mod assist;HOB elevated (uses HOB at home)     General bed mobility comments: pt has electric bed remote at home to elevate HOB and grabs bedside table to roll. pt pulls on rollator at EOB per wife  Transfers Overall transfer level: Needs assistance Equipment used: Rolling walker (2 wheeled);2 person hand held assist Transfers: Sit to/from Stand Sit to Stand: Mod assist         General transfer comment: Pt with posterior lean initially and progressed to sit<>Stand from recliner min (A). Pt needed cues for safety, hand placement and arm rest on the chair     Balance Overall balance assessment: Needs assistance Sitting-balance support: Bilateral upper extremity supported;Feet  supported Sitting balance-Leahy Scale: Fair   Postural control: Posterior lean Standing balance support: Bilateral upper extremity supported;During functional activity Standing balance-Leahy Scale: Poor                              ADL Overall ADL's : Needs assistance/impaired                         Toilet Transfer: Moderate assistance;BSC;RW Toilet Transfer Details (indicate cue type and reason): pt requires cues for hand placement to provide pulling on RW         Functional mobility during ADLs: Moderate assistance;Rolling walker General ADL Comments: Pt uses rollator at baseline. pt with posterior lean with initial sit<>stand. pt completed sit<>stand from 2 surfaces and with incr attempts decr posterior lean. Pt with baseline cognitive deficits and needed redirection during session. Pt currently reports "im not sick"       Vision                     Perception     Praxis      Pertinent Vitals/Pain VSS     Hand Dominance Right   Extremity/Trunk Assessment Upper Extremity Assessment Upper Extremity Assessment: Generalized weakness   Lower Extremity Assessment Lower Extremity Assessment: Defer to PT evaluation   Cervical / Trunk Assessment Cervical / Trunk Assessment: Kyphotic   Communication Communication Communication: HOH (has hearing aides)   Cognition Arousal/Alertness: Awake/alert Behavior During Therapy: WFL for  tasks assessed/performed Overall Cognitive Status: History of cognitive impairments - at baseline (wife present on eval)       Memory: Decreased short-term memory (reports "im not sick I dont know why I am here")             General Comments       Exercises       Shoulder Instructions      Home Living Family/patient expects to be discharged to:: Private residence Living Arrangements: Spouse/significant other Available Help at Discharge: Family Type of Home: House Home Access: Other (comment) (lift  chair into house and upstairs)     Home Layout: Two level Alternate Level Stairs-Number of Steps: uses lift chair   Bathroom Shower/Tub: Walk-in shower   Bathroom Toilet: Handicapped height     Home Equipment: Environmental consultant - 4 wheels;Shower seat;Bedside commode;Hand held shower head;Grab bars - tub/shower;Grab bars - toilet   Additional Comments: pt is a poor historian      Prior Functioning/Environment Level of Independence: Needs assistance  Gait / Transfers Assistance Needed: ambulates around the house with rollator and uses lift chair for inside/ upstair transfers ADL's / Homemaking Assistance Needed: pt ambulates into shower. Pt sits in chair and wife applies soap to patient. Pt is able to complete UB bathing. pt uses grab bar to static stand and let water wash off soap.        OT Diagnosis: Generalized weakness;Cognitive deficits   OT Problem List: Decreased strength;Decreased activity tolerance;Impaired balance (sitting and/or standing);Decreased cognition;Decreased safety awareness;Decreased knowledge of use of DME or AE;Decreased knowledge of precautions;Cardiopulmonary status limiting activity   OT Treatment/Interventions: Self-care/ADL training;Therapeutic exercise;DME and/or AE instruction;Therapeutic activities;Cognitive remediation/compensation;Patient/family education;Balance training    OT Goals(Current goals can be found in the care plan section) Acute Rehab OT Goals Patient Stated Goal: none stated OT Goal Formulation: With patient/family Time For Goal Achievement: 03/22/14 Potential to Achieve Goals: Good  OT Frequency: Min 2X/week   Barriers to D/C:            Co-evaluation              End of Session Equipment Utilized During Treatment: Gait belt;Rolling walker Nurse Communication: Mobility status;Precautions  Activity Tolerance: Patient tolerated treatment well Patient left: in chair;with call bell/phone within reach;with family/visitor present    Time: 1011-1036 OT Time Calculation (min): 25 min Charges:  OT General Charges $OT Visit: 1 Procedure OT Evaluation $Initial OT Evaluation Tier I: 1 Procedure OT Treatments $Self Care/Home Management : 8-22 mins G-Codes:    Peri Maris 2014-03-09, 12:18 PM Pager: [phone]

## 2014-03-08 NOTE — Progress Notes (Signed)
NURSING PROGRESS NOTE  [name] [name] [id number] Discharge Data: 03/08/2014 4:15 PM Attending Provider: Domenic Polite, MD IFO:YDXAJOINO,MVE [name], MD     [name] to be D/C'd Skilled nursing facility per MD order.  Report given to RN at [facility]. AVS given via EMS. All IV's discontinued with no bleeding noted. All belongings returned to patient for patient to take.  Last Vital Signs:  Blood pressure 128/71, pulse 69, temperature 97.7 F (36.5 C), temperature source Oral, resp. rate 18, height 5\' 8"  (1.727 m), weight 73.5 kg (162 lb 0.6 oz), SpO2 97.00%.  Discharge Medication List   Medication List         acetaminophen 325 MG tablet  Commonly known as:  TYLENOL  Take 2 tablets (650 mg total) by mouth every 6 (six) hours as needed (or Fever >/= 101).     amLODipine 5 MG tablet  Commonly known as:  NORVASC  Take 5 mg by mouth daily.     aspirin 81 MG tablet  Take 81 mg by mouth daily.     buPROPion 100 MG 12 hr tablet  Commonly known as:  WELLBUTRIN SR  Take 100 mg by mouth 2 (two) times daily.     carbidopa-levodopa 25-100 MG per tablet  Commonly known as:  SINEMET IR  Take 1 tablet by mouth 3 (three) times daily.     donepezil 10 MG tablet  Commonly known as:  ARICEPT  Take 5 mg by mouth daily.     furosemide 40 MG tablet  Commonly known as:  LASIX  Take 1 tablet (40 mg total) by mouth daily.     levofloxacin 500 MG tablet  Commonly known as:  LEVAQUIN  Take 1 tablet (500 mg total) by mouth daily. For 3 days     lisinopril 5 MG tablet  Commonly known as:  PRINIVIL,ZESTRIL  Take 5 mg by mouth daily as needed. For blood pressure greater then 150     senna-docusate 8.6-50 MG per tablet  Commonly known as:  Senokot-S  Take 1 tablet by mouth at bedtime as needed for mild constipation.         Wallie Renshaw, RN

## 2014-03-08 NOTE — Progress Notes (Signed)
Report given to Sam, Therapist, sports at [place].

## 2014-03-08 NOTE — Clinical Social Work Note (Signed)
Clinical Social Work Department CLINICAL SOCIAL WORK PLACEMENT NOTE 03/08/2014  Patient:  [name] [name], [name] [name]  Account Number:  [id number] Admit date:  03/02/2014  Clinical Social Worker:  Lovey Newcomer  Date/time:  03/08/2014 03:17 PM  Clinical Social Work is seeking post-discharge placement for this patient at the following level of care:   [place]   (*CSW will update this form in Epic as items are completed)   03/08/2014  Patient/family provided with [place] Department of Clinical Social Work's list of facilities offering this level of care within the geographic area requested by the patient (or if unable, by the patient's family).  03/08/2014  Patient/family informed of their freedom to choose among providers that offer the needed level of care, that participate in Medicare, Medicaid or managed care program needed by the patient, have an available bed and are willing to accept the patient.  03/08/2014  Patient/family informed of [facility]' ownership interest in [facility], as well as of the fact that they are under no obligation to receive care at this facility.  PASARR submitted to EDS on 03/08/2014 PASARR number received from EDS on 03/08/2014  FL2 transmitted to all facilities in geographic area requested by pt/family on  03/08/2014 FL2 transmitted to all facilities within larger geographic area on   Patient informed that his/her managed care company has contracts with or will negotiate with  certain facilities, including the following:     Patient/family informed of bed offers received:  03/08/2014 Patient chooses bed at [facility] Physician recommends and patient chooses bed at    Patient to be transferred to [place] on  03/08/2014 Patient to be transferred to facility by Ambulance  The following physician request were entered in Epic:   Additional Comments: Per Md patient ready to DC to  [facility]. RN, patient's wife Nevin Bloodgood, and facility aware of patient's discharge. [name] completing paperwork at facility. CSW has requested ambulance transport for patient. DC packet on chart.  CSW signing off at this time.    Liz Beach MSW, [place], [place], [phone]

## 2014-03-08 NOTE — Clinical Social Work Psychosocial (Signed)
Clinical Social Work Department BRIEF PSYCHOSOCIAL ASSESSMENT 03/08/2014  Patient:  [name] [name], [name] [name]     Account Number:  [id number]     Admit date:  03/02/2014  Clinical Social Worker:  Lovey Newcomer  Date/Time:  03/08/2014 10:00 AM  Referred by:  Physician  Date Referred:  03/08/2014 Referred for  SNF Placement   Other Referral:   Interview type:  Family Other interview type:   Wife interviewed at bedside. Patient somewhat confused and not able to contribute to assessment.    PSYCHOSOCIAL DATA Living Status:  WIFE Admitted from facility:   Level of care:   Primary support name:  [name] [name] Primary support relationship to patient:  SPOUSE Degree of support available:   Support is strong.    CURRENT CONCERNS Current Concerns  Post-Acute Placement   Other Concerns:    SOCIAL WORK ASSESSMENT / PLAN CSW met with patient and wife at bedside to complete assessment. [name] [name] [name] states that patient is from home with her. [name] [name] currently works for the [employer] and plans to retire in one month. [name] [name] agrees with PT's recommendations for patient to go to SNF at discharge prior to returning home. [name] [name] reports that patient has had a pasat SNF stay at [facility]. She has a preference for [facility]. CSW explained SNF search/placement process and answered [name] [name]'s questions. CSW explained that patient is ready for discharge, so choice of facility will need to be made quickly and CSW cannot guarantee a bed at her preferred facilities. [name] [name] verbalized understanding. CSW inquired about how [name] [name] and the patient have been managing at home. She states that she is usually able to care for the patient as she has the necessary equipment at home, but when he gets sick and becomes deconditioned, she is unable to meet his care needs. [name] [name] is happy with the care her husband has received at the hospital and is happy to know that he will be able to go to SNF at discharge.  She is optimistic in her husband's ability to return home after a short SNF stay.   Assessment/plan status:  Psychosocial Support/Ongoing Assessment of Needs Other assessment/ plan:   Complete FL2, Fax, PASRR. Claiborne Billings with [facility] has offerred a bed for patient and states that she will assist [name] [name] in having patient's Medicare A made primary (BCBS FED EMP PPO plan is currently primary) for SNF coverage.   Information/referral to community resources:   CSW contact information and SNF list given to [name] [name].    [name]/FAMILY'S RESPONSE TO PLAN OF CARE: [name] [name] plans for patient to discharge to SNF prior to returning home. [name] [name] was engaged in assessment and appreciative of CSW's assistance. CSW will assist with DC.       [name] [name] MSW, [place], [place], [phone]

## 2014-06-21 ENCOUNTER — Encounter (HOSPITAL_COMMUNITY): Payer: Self-pay | Admitting: Emergency Medicine

## 2014-06-21 ENCOUNTER — Emergency Department (HOSPITAL_COMMUNITY): Payer: Medicare Other

## 2014-06-21 ENCOUNTER — Inpatient Hospital Stay (HOSPITAL_COMMUNITY)
Admission: EM | Admit: 2014-06-21 | Discharge: 2014-06-26 | DRG: 177 | Disposition: A | Discharge status: Home / Self Care | Payer: Medicare Other | Attending: Internal Medicine | Admitting: Internal Medicine

## 2014-06-21 DIAGNOSIS — I700 Atherosclerosis of aorta: Secondary | ICD-10-CM | POA: Diagnosis present

## 2014-06-21 DIAGNOSIS — G20 Parkinson's disease: Secondary | ICD-10-CM | POA: Diagnosis present

## 2014-06-21 DIAGNOSIS — R1311 Dysphagia, oral phase: Secondary | ICD-10-CM | POA: Diagnosis present

## 2014-06-21 DIAGNOSIS — Z66 Do not resuscitate: Secondary | ICD-10-CM | POA: Diagnosis present

## 2014-06-21 DIAGNOSIS — R21 Rash and other nonspecific skin eruption: Secondary | ICD-10-CM

## 2014-06-21 DIAGNOSIS — Z8673 Personal history of transient ischemic attack (TIA), and cerebral infarction without residual deficits: Secondary | ICD-10-CM | POA: Diagnosis not present

## 2014-06-21 DIAGNOSIS — J189 Pneumonia, unspecified organism: Secondary | ICD-10-CM

## 2014-06-21 DIAGNOSIS — IMO0002 Reserved for concepts with insufficient information to code with codable children: Secondary | ICD-10-CM

## 2014-06-21 DIAGNOSIS — I10 Essential (primary) hypertension: Secondary | ICD-10-CM | POA: Diagnosis present

## 2014-06-21 DIAGNOSIS — I714 Abdominal aortic aneurysm, without rupture, unspecified: Secondary | ICD-10-CM | POA: Diagnosis present

## 2014-06-21 DIAGNOSIS — E43 Unspecified severe protein-calorie malnutrition: Secondary | ICD-10-CM | POA: Diagnosis present

## 2014-06-21 DIAGNOSIS — G9340 Encephalopathy, unspecified: Secondary | ICD-10-CM

## 2014-06-21 DIAGNOSIS — E785 Hyperlipidemia, unspecified: Secondary | ICD-10-CM | POA: Diagnosis present

## 2014-06-21 DIAGNOSIS — G20A1 Parkinson's disease without dyskinesia, without mention of fluctuations: Secondary | ICD-10-CM | POA: Diagnosis present

## 2014-06-21 DIAGNOSIS — J9600 Acute respiratory failure, unspecified whether with hypoxia or hypercapnia: Secondary | ICD-10-CM | POA: Diagnosis present

## 2014-06-21 DIAGNOSIS — R4182 Altered mental status, unspecified: Secondary | ICD-10-CM | POA: Diagnosis present

## 2014-06-21 DIAGNOSIS — I4891 Unspecified atrial fibrillation: Secondary | ICD-10-CM | POA: Diagnosis present

## 2014-06-21 DIAGNOSIS — E876 Hypokalemia: Secondary | ICD-10-CM | POA: Diagnosis present

## 2014-06-21 DIAGNOSIS — F0150 Vascular dementia without behavioral disturbance: Secondary | ICD-10-CM | POA: Diagnosis present

## 2014-06-21 DIAGNOSIS — J9601 Acute respiratory failure with hypoxia: Secondary | ICD-10-CM

## 2014-06-21 DIAGNOSIS — J690 Pneumonitis due to inhalation of food and vomit: Secondary | ICD-10-CM | POA: Diagnosis present

## 2014-06-21 DIAGNOSIS — R1313 Dysphagia, pharyngeal phase: Secondary | ICD-10-CM | POA: Diagnosis present

## 2014-06-21 DIAGNOSIS — F0391 Unspecified dementia with behavioral disturbance: Secondary | ICD-10-CM | POA: Diagnosis present

## 2014-06-21 DIAGNOSIS — D72829 Elevated white blood cell count, unspecified: Secondary | ICD-10-CM | POA: Diagnosis present

## 2014-06-21 DIAGNOSIS — F03918 Unspecified dementia, unspecified severity, with other behavioral disturbance: Secondary | ICD-10-CM

## 2014-06-21 DIAGNOSIS — Z48812 Encounter for surgical aftercare following surgery on the circulatory system: Secondary | ICD-10-CM

## 2014-06-21 DIAGNOSIS — I6529 Occlusion and stenosis of unspecified carotid artery: Secondary | ICD-10-CM

## 2014-06-21 HISTORY — DX: Unspecified macular degeneration: H35.30

## 2014-06-21 HISTORY — DX: Procedure and treatment not carried out because of patient's decision for reasons of belief and group pressure: Z53.1

## 2014-06-21 HISTORY — DX: Myoneural disorder, unspecified: G70.9

## 2014-06-21 LAB — I-STAT TROPONIN, ED: Troponin i, poc: 0.03 ng/mL (ref 0.00–0.08)

## 2014-06-21 LAB — COMPREHENSIVE METABOLIC PANEL
AST: 18 U/L (ref 0–37)
Albumin: 3.4 g/dL — ABNORMAL LOW (ref 3.5–5.2)
Alkaline Phosphatase: 87 U/L (ref 39–117)
Anion gap: 16 — ABNORMAL HIGH (ref 5–15)
BUN: 21 mg/dL (ref 6–23)
CALCIUM: 9.7 mg/dL (ref 8.4–10.5)
CO2: 24 mEq/L (ref 19–32)
CREATININE: 0.99 mg/dL (ref 0.50–1.35)
Chloride: 104 mEq/L (ref 96–112)
GFR calc Af Amer: 82 mL/min — ABNORMAL LOW (ref >90)
GFR calc non Af Amer: 70 mL/min — ABNORMAL LOW (ref >90)
GLUCOSE: 164 mg/dL — AB (ref 70–99)
Potassium: 3.8 mEq/L (ref 3.7–5.3)
SODIUM: 144 meq/L (ref 137–147)
TOTAL PROTEIN: 6.9 g/dL (ref 6.0–8.3)
Total Bilirubin: 0.3 mg/dL (ref 0.3–1.2)

## 2014-06-21 LAB — CBC WITH DIFFERENTIAL/PLATELET
BASOS ABS: 0 10*3/uL (ref 0.0–0.1)
Basophils Relative: 0 % (ref 0–1)
EOS ABS: 0.1 10*3/uL (ref 0.0–0.7)
EOS PCT: 1 % (ref 0–5)
HCT: 43.4 % (ref 39.0–52.0)
Hemoglobin: 13.9 g/dL (ref 13.0–17.0)
LYMPHS ABS: 1 10*3/uL (ref 0.7–4.0)
Lymphocytes Relative: 10 % — ABNORMAL LOW (ref 12–46)
MCH: 28.8 pg (ref 26.0–34.0)
MCHC: 32 g/dL (ref 30.0–36.0)
MCV: 90 fL (ref 78.0–100.0)
Monocytes Absolute: 0.3 10*3/uL (ref 0.1–1.0)
Monocytes Relative: 3 % (ref 3–12)
Neutro Abs: 9.5 10*3/uL — ABNORMAL HIGH (ref 1.7–7.7)
Neutrophils Relative %: 86 % — ABNORMAL HIGH (ref 43–77)
Platelets: 139 10*3/uL — ABNORMAL LOW (ref 150–400)
RBC: 4.82 MIL/uL (ref 4.22–5.81)
RDW: 14.4 % (ref 11.5–15.5)
WBC: 10.9 10*3/uL — ABNORMAL HIGH (ref 4.0–10.5)

## 2014-06-21 LAB — URINALYSIS, ROUTINE W REFLEX MICROSCOPIC
Bilirubin Urine: NEGATIVE
GLUCOSE, UA: 100 mg/dL — AB
Hgb urine dipstick: NEGATIVE
KETONES UR: 15 mg/dL — AB
Leukocytes, UA: NEGATIVE
NITRITE: NEGATIVE
Protein, ur: 30 mg/dL — AB
Specific Gravity, Urine: 1.02 (ref 1.005–1.030)
Urobilinogen, UA: 0.2 mg/dL (ref 0.0–1.0)
pH: 6.5 (ref 5.0–8.0)

## 2014-06-21 LAB — CBG MONITORING, ED: GLUCOSE-CAPILLARY: 144 mg/dL — AB (ref 70–99)

## 2014-06-21 LAB — POC OCCULT BLOOD, ED: Fecal Occult Bld: NEGATIVE

## 2014-06-21 LAB — I-STAT CG4 LACTIC ACID, ED: Lactic Acid, Venous: 2.16 mmol/L (ref 0.5–2.2)

## 2014-06-21 LAB — URINE MICROSCOPIC-ADD ON

## 2014-06-21 MED ORDER — ONDANSETRON HCL 4 MG/2ML IJ SOLN: 4.0000 mg | Freq: Four times a day (QID) | INTRAMUSCULAR | Status: DC | PRN

## 2014-06-21 MED ORDER — ASPIRIN 81 MG PO TABS: 81.0000 mg | ORAL_TABLET | Freq: Every day | ORAL | Status: DC

## 2014-06-21 MED ORDER — POLYETHYLENE GLYCOL 3350 17 G PO PACK
17.0000 g | PACK | Freq: Every day | ORAL | Status: DC | PRN
  Filled 2014-06-21: qty 1

## 2014-06-21 MED ORDER — DEXTROSE-NACL 5-0.45 % IV SOLN
INTRAVENOUS | Status: AC
  Administered 2014-06-21 – 2014-06-22 (×3): via INTRAVENOUS

## 2014-06-21 MED ORDER — DEXTROSE 5 % IV SOLN
1.0000 g | Freq: Once | INTRAVENOUS | Status: AC
  Administered 2014-06-21: 1 g via INTRAVENOUS
  Filled 2014-06-21: qty 10

## 2014-06-21 MED ORDER — HYDRALAZINE HCL 20 MG/ML IJ SOLN
5.0000 mg | Freq: Four times a day (QID) | INTRAMUSCULAR | Status: DC | PRN
  Administered 2014-06-21: 5 mg via INTRAVENOUS
  Filled 2014-06-21 (×2): qty 1

## 2014-06-21 MED ORDER — SODIUM CHLORIDE 0.9 % IV SOLN
1.5000 g | Freq: Three times a day (TID) | INTRAVENOUS | Status: DC
  Administered 2014-06-21 – 2014-06-22 (×3): 1.5 g via INTRAVENOUS
  Filled 2014-06-21 (×4): qty 1.5

## 2014-06-21 MED ORDER — QUETIAPINE FUMARATE 25 MG PO TABS
25.0000 mg | ORAL_TABLET | Freq: Every evening | ORAL | Status: DC | PRN
  Administered 2014-06-23 – 2014-06-24 (×2): 25 mg via ORAL
  Filled 2014-06-21 (×3): qty 1

## 2014-06-21 MED ORDER — CARBIDOPA-LEVODOPA 25-100 MG PO TABS
1.0000 | ORAL_TABLET | Freq: Three times a day (TID) | ORAL | Status: DC
  Administered 2014-06-21 – 2014-06-26 (×10): 1 via ORAL
  Filled 2014-06-21 (×19): qty 1

## 2014-06-21 MED ORDER — HEPARIN SODIUM (PORCINE) 5000 UNIT/ML IJ SOLN
5000.0000 [IU] | Freq: Three times a day (TID) | INTRAMUSCULAR | Status: DC
  Administered 2014-06-21 – 2014-06-26 (×15): 5000 [IU] via SUBCUTANEOUS
  Filled 2014-06-21 (×18): qty 1

## 2014-06-21 MED ORDER — ONDANSETRON HCL 4 MG/2ML IJ SOLN: 4.0000 mg | Freq: Three times a day (TID) | INTRAMUSCULAR | Status: DC | PRN

## 2014-06-21 MED ORDER — DEXTROSE 5 % IV SOLN
500.0000 mg | Freq: Once | INTRAVENOUS | Status: AC
  Administered 2014-06-21: 500 mg via INTRAVENOUS
  Filled 2014-06-21: qty 500

## 2014-06-21 MED ORDER — DONEPEZIL HCL 5 MG PO TABS
5.0000 mg | ORAL_TABLET | Freq: Every day | ORAL | Status: DC
  Administered 2014-06-21 – 2014-06-26 (×4): 5 mg via ORAL
  Filled 2014-06-21 (×6): qty 1

## 2014-06-21 MED ORDER — SENNOSIDES-DOCUSATE SODIUM 8.6-50 MG PO TABS: 1.0000 | ORAL_TABLET | Freq: Every evening | ORAL | Status: DC | PRN

## 2014-06-21 MED ORDER — ACETAMINOPHEN 325 MG PO TABS
650.0000 mg | ORAL_TABLET | Freq: Four times a day (QID) | ORAL | Status: DC | PRN
  Administered 2014-06-25: 650 mg via ORAL
  Filled 2014-06-21: qty 2

## 2014-06-21 MED ORDER — ALBUTEROL SULFATE (2.5 MG/3ML) 0.083% IN NEBU: 2.5000 mg | INHALATION_SOLUTION | RESPIRATORY_TRACT | Status: DC | PRN

## 2014-06-21 MED ORDER — SODIUM CHLORIDE 0.9 % IV SOLN: INTRAVENOUS | Status: AC

## 2014-06-21 MED ORDER — ACETAMINOPHEN 650 MG RE SUPP: 650.0000 mg | Freq: Four times a day (QID) | RECTAL | Status: DC | PRN

## 2014-06-21 MED ORDER — HALOPERIDOL LACTATE 5 MG/ML IJ SOLN
1.0000 mg | Freq: Four times a day (QID) | INTRAMUSCULAR | Status: DC | PRN
  Administered 2014-06-23 (×2): 1 mg via INTRAVENOUS
  Filled 2014-06-21 (×2): qty 1

## 2014-06-21 MED ORDER — ACETAMINOPHEN 325 MG PO TABS: 650.0000 mg | ORAL_TABLET | Freq: Four times a day (QID) | ORAL | Status: DC | PRN

## 2014-06-21 MED ORDER — ONDANSETRON HCL 4 MG PO TABS: 4.0000 mg | ORAL_TABLET | Freq: Four times a day (QID) | ORAL | Status: DC | PRN

## 2014-06-21 NOTE — ED Provider Notes (Signed)
CSN: [id number]     Arrival date & time 06/21/14  0601 History   First MD Initiated Contact with Patient 06/21/14 [phone]     Chief Complaint  Patient presents with  . Altered Mental Status     (Consider location/radiation/quality/duration/timing/severity/associated sxs/prior Treatment) HPI [name] [name] is an 80 year old male with extensive medical history including dementia, A. fib, aortic sclerosis presenting with "altered mental status" as reported by his wife since this morning. His wife states patient was acting lethargic yesterday, went to sleep after lunch, refused to eat dinner and then was up all night with a productive cough. [name] states this morning patient was difficult to arouse, and acted "very drowsy". In the ER, patient continues to be drowsy, is able to be aroused with voice, and will answer yes or no questions appropriately. Patient obeying simple verbal commands, however wife states that this is decreased mental status from patient's baseline. [name] states that he is typically conversational, and "more alert". Patient with admission to the hospital in 6/15 for community-acquired pneumonia. Past Medical History  Diagnosis Date  . Hypertension   . Hyperlipidemia   . Carotid artery occlusion   . AAA (abdominal aortic aneurysm)   . Valvular heart disease   . Hyperlipidemia   . Cancer     "skin cancer removed from top of head"  . Arthritis     "in his spine"  . Refusal of blood transfusions as patient is Jehovah's Witness 08/31/11  . PTSD (post-traumatic stress disorder)   . Depression     "terrible; from the PTSD"  . Anxiety   . Nightmares   . Spinal stenosis   . Memory loss   . Osteoarthritis     bilateral knees  . Aortic sclerosis     insufficiency mild echo, 10/2007  . Hypercholesteremia   . Renal disease     stage lll  . Dementia, vascular Oct. 2013  . Atrial fibrillation   . Back pain   . Thyrotoxicosis     without mention of goiter or  other cause, without mention of thyrotixic crisis or storm  . Orthostatic hypotension   . Cardiac dysrhythmia   . Kidney disorder   . Ureteral disorder   . Movement disorder   . 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 Transfusion of blood product refused for religious reason   . Neuromuscular disorder     parkinsons  . Macular degeneration    Past Surgical History  Procedure Laterality Date  . Wrist surgery  2003    left; S/P fracture; "put a plate in it"  . Abdominal aortic aneurysm repair  2002  . Carotid endarterectomy  2004    left  . Total knee arthroplasty  2000's    bilaterally  . Cataract extraction w/ intraocular lens  implant, bilateral  ~ 2010  . Joint replacement  2003 and 2004    Right and left knee  . Replacement total knee  12/2006  . Eye surgery    . Fracture surgery Left     wrist   Family History  Problem Relation Age of Onset  . Heart disease Father     Heart Disease before age 71  . Heart attack Father   . Stroke Mother   . Arthritis Mother   . Hypertension Mother   . Heart attack Mother   . Diabetes Maternal Aunt   . Diabetes Brother   . Heart attack Brother   . Heart attack Sister  History  Substance Use Topics  . Smoking status: Former Smoker    Types: Cigarettes  . Smokeless tobacco: Never Used     Comment: "stopped smoking cigarettes 1969"  . Alcohol Use: No     Comment: quit 2013    Review of Systems  Unable to perform ROS: Dementia      Allergies  Aceon and Morphine and related  Home Medications   Prior to Admission medications   Medication Sig Start Date End Date Taking? Authorizing Provider  acetaminophen (TYLENOL) 325 MG tablet Take 650 mg by mouth daily as needed for headache (pain).   Yes Historical Provider, MD  amLODipine (NORVASC) 5 MG tablet Take 2.5 mg by mouth daily as needed (if SBP >171).    Yes Historical Provider, MD  carbidopa-levodopa (SINEMET IR) 25-100 MG per tablet Take 1 tablet by mouth 3 (three) times daily.  [id number]   Yes Historical Provider, MD  polyethylene glycol (MIRALAX / GLYCOLAX) packet Take 17 g by mouth daily as needed for mild constipation.   Yes Historical Provider, MD  fluconazole (DIFLUCAN) 100 MG tablet Take 100 mg by mouth daily. Finished 5 day couse    Historical Provider, MD   BP 197/72  Pulse 64  Temp(Src) 98 F (36.7 C) (Rectal)  Resp 17  Ht 5\' 8"  (1.727 m)  Wt 137 lb 8 oz (62.37 kg)  BMI 20.91 kg/m2  SpO2 100% Physical Exam  Constitutional: Vital signs are normal. He appears lethargic. No distress.  Frail-appearing elderly male lying semi-Fowlers, asleep on the stretcher.  HENT:  Head: Normocephalic and atraumatic.  Mouth/Throat: Oropharynx is clear and moist. No oropharyngeal exudate.  Eyes: Right eye exhibits no discharge. Left eye exhibits no discharge. No scleral icterus.  Neck: Normal range of motion.  Cardiovascular: Normal rate and regular rhythm.   Murmur heard.  Systolic murmur is present with a grade of 2/6  Pulses:      Radial pulses are 2+ on the right side, and 2+ on the left side.  Murmur best heard at right upper sternal border  Pulmonary/Chest: Effort normal and breath sounds normal. No accessory muscle usage. Not tachypneic. No respiratory distress.  Abdominal: Soft. Normal appearance and bowel sounds are normal. There is no tenderness.  Musculoskeletal: Normal range of motion. He exhibits no edema and no tenderness.  Neurological: He appears lethargic. Coordination normal. GCS eye subscore is 3. GCS verbal subscore is 1. GCS motor subscore is 6.  Patient drowsy. Patient able to be aroused with voice and to make eye contact. Patient mumbles answers when asked questions. He can shake his head yes or no to questions appropriately. Patient able to lift his arms, squeeze hands, with equal strength noted and able to follow simple commands. Patient uncooperative with further neuro exam.   Skin: Skin is warm and dry. No rash noted. He is not  diaphoretic.  Psychiatric: He has a normal mood and affect.    ED Course  Procedures (including critical care time) Labs Review Labs Reviewed  CBC WITH DIFFERENTIAL - Abnormal; Notable for the following:    WBC 10.9 (*)    Platelets 139 (*)    Neutrophils Relative % 86 (*)    Neutro Abs 9.5 (*)    Lymphocytes Relative 10 (*)    All other components within normal limits  COMPREHENSIVE METABOLIC PANEL - Abnormal; Notable for the following:    Glucose, Bld 164 (*)    Albumin 3.4 (*)    GFR calc non Af  Amer 70 (*)    GFR calc Af Amer 82 (*)    Anion gap 16 (*)    All other components within normal limits  URINALYSIS, ROUTINE W REFLEX MICROSCOPIC - Abnormal; Notable for the following:    Glucose, UA 100 (*)    Ketones, ur 15 (*)    Protein, ur 30 (*)    All other components within normal limits  CBG MONITORING, ED - Abnormal; Notable for the following:    Glucose-Capillary 144 (*)    All other components within normal limits  URINE CULTURE  CULTURE, BLOOD (ROUTINE X 2)  CULTURE, BLOOD (ROUTINE X 2)  URINE MICROSCOPIC-ADD ON  COMPREHENSIVE METABOLIC PANEL  I-STAT CG4 LACTIC ACID, ED  Randolm Idol, ED  POC OCCULT BLOOD, ED    Imaging Review Dg Chest Port 1 View  06/21/2014   CLINICAL DATA:  Altered mental status.  EXAM: PORTABLE CHEST - 1 VIEW  COMPARISON:  Chest radiograph performed 03/05/2014  FINDINGS: The lungs are well-aerated. Mild right basilar airspace opacity could reflect mild pneumonia. Pulmonary vascularity is at the upper limits of normal. No pleural effusion or pneumothorax is seen.  The cardiomediastinal silhouette is borderline normal in size. Calcification is noted within the aortic arch. No acute osseous abnormalities are seen.  IMPRESSION: Mild right basilar airspace opacity could reflect mild pneumonia.   Electronically Signed   By: Garald Balding M.D.   On: 06/21/2014 06:58     EKG Interpretation   Date/Time:  Thursday June 21 2014 06:16:23  EDT Ventricular Rate:  87 PR Interval:  190 QRS Duration: 106 QT Interval:  379 QTC Calculation: 456 R Axis:   -34 Text Interpretation:  Sinus rhythm Abnormal R-wave progression, early  transition Probable inferior infarct, age indeterminate No significant  change since last tracing Confirmed by OTTER  MD, OLGA ([id number]) on  06/21/2014 6:26:26 AM      MDM   Final diagnoses:  CAP (community acquired pneumonia)    80 year old male with extensive medical history including dementia, A. fib, aortic sclerosis presenting with "altered mental status" as reported by wife since this morning. [name] states patient was acting lethargic yesterday, went to sleep after lunch, refuse to eat dinner, and was up all night coughing. [name] states this morning patient was to difficult to arouse and she called 911. No fever as reported by wife. With patient having some dementia at baseline in fact he could not sleep last night, our workup will focus more on the fact the patient was up coughing, acting somewhat lethargic and complaining of a generalized malaise yesterday. Workup patient for possible infection source with CBC, CMP, urine/urine culture, blood cultures, lactic acid, chest x-ray.   Chest x-ray remarkable for mild right basilar airspace opacity with possible reflection of mild pneumonia. [name] mental status remained the same with patient continuing to be drowsy. We'll place consult to medicine for admission of patient at this time. No leukocytosis, obvious anemia, electrolyte abnormality. Troponin negative. UA unremarkable.  Try hospitalist agreed to admit patient for inpatient workup.The patient appears reasonably stabilized for admission considering the current resources, flow, and capabilities available in the ED at this time, and I doubt any other [facility] requiring further screening and/or treatment in the ED prior to admission.   BP 197/72  Pulse 64  Temp(Src) 98 F (36.7 C)  (Rectal)  Resp 17  Ht 5\' 8"  (1.727 m)  Wt 137 lb 8 oz (62.37 kg)  BMI 20.91 kg/m2  SpO2 100%  Signed,  Dahlia Bailiff, PA-C 5:07 PM   This patient seen and discussed with Dr. Carmin Muskrat, M.D.    Carrie Mew, PA-C 06/21/14 1708

## 2014-06-21 NOTE — ED Notes (Signed)
Wife at bedside.

## 2014-06-21 NOTE — Evaluation (Signed)
Physical Therapy Evaluation Patient Details Name: [name] [name] MRN: [id number] DOB: June 30, 1923 Today's Date: 06/21/2014   History of Present Illness  Patient is a 80 y/o male with PMH of dementia and PD's discharged from the hospital more than 3 months ago for community-acquired pneumonia presents with slow decline over the last couple weeks where he's had episodes of coughing with eating. Per family, pt couldn't get out of bed the day of admission and was breathing more rapidly so wife called EMS. Chest xray-right lower lobe infiltrate with leukocytosis.   Clinical Impression  Patient presents with functional limitations due to deficits listed in PT problem list (see below). Pt very lethargic today and complaining of pain "all over" limiting mobility assessment. Difficult to maintain pt's attention. History/PLOF obtained from family in room. Pt more confused than baseline per family report and requiring constant verbal and tactile cues to participate in therapy. Anticipate mobility will improve when mentation clears. Pt would benefit from acute PT to improve transfers, gait and overall safe mobility so pt can maximize independence and minimize fall risk prior to return home. Disposition will be pending further assessment and progress. Discussed with family.    Follow Up Recommendations SNF;Supervision/Assistance - 24 hour    Equipment Recommendations  None recommended by PT    Recommendations for Other Services       Precautions / Restrictions Precautions Precautions: Fall Restrictions Weight Bearing Restrictions: No      Mobility  Bed Mobility Overal bed mobility: Needs Assistance Bed Mobility: Supine to Sit;Sit to Supine     Supine to sit: Max assist;HOB elevated Sit to supine: Mod assist;HOB elevated   General bed mobility comments: Requires maximal verbal and manual cues to perform transfer. Impaired initiation. Lethargic. Assist to get BLEs to EOB and pt reaching to  therapist to assist elevating trunk. Assist to return LEs to supine. Resisting therapist at times.   Transfers Overall transfer level: Needs assistance Equipment used: 2 person hand held assist Transfers: Sit to/from Stand Sit to Stand: Mod assist;+2 physical assistance         General transfer comment: Stood x2 with verbal/manual cues for anterior weightshift and translation. Pt with increased forward flexion and posterior lean resisting therapist. Not able to obtain erect posture.  Ambulation/Gait             General Gait Details: Pt not able to clear BLEs even with manual cues for weightshifting.  Stairs            Wheelchair Mobility    Modified Rankin (Stroke Patients Only)       Balance Overall balance assessment: History of Falls;Needs assistance   Sitting balance-Leahy Scale: Poor Sitting balance - Comments: Requires Min-Mod A to maintain sitting balance with UE support. Pt with constant posterior trunk lean and trying to return to supine during session. Able to sit unsupported for less than 10 sec with posterior lean. Postural control: Posterior lean   Standing balance-Leahy Scale: Zero Standing balance comment: Requires assist of 2 people to stand. poor standing balance/tolerance.                              Pertinent Vitals/Pain Pain Assessment: Faces Faces Pain Scale: Hurts even more Pain Location: "all over" Pain Descriptors / Indicators: Aching;Sore Pain Intervention(s): Limited activity within patient's tolerance;Monitored during session;Repositioned    Home Living Family/patient expects to be discharged to:: Private residence Living Arrangements: Spouse/significant other Available Help  at Discharge: Family Type of Home: House Home Access: Other (comment)     Home Layout: Two level Home Equipment: [place] - 4 wheels;Shower seat;Bedside commode;Hand held shower head;Grab bars - tub/shower;Grab bars - toilet;Wheelchair -  Education administrator (comment) (transport chair) Additional Comments: pt is a poor historian however wife and daughter present to provide accurate information.    Prior Function Level of Independence: Needs assistance   Gait / Transfers Assistance Needed: ambulates around the house with rollator and uses lift chair for inside/ upstair transfers. Sometimes pushed transport w/c in house for support. Multiple falls at home.   ADL's / Homemaking Assistance Needed: Family reports since July 3 (after coming home from SNF), pt requires assist PRN for dressing/bathing. Cognitive/function fluctuates - hx of dementia.  Comments: PTA, pt walked down driveway to get newspaper.     Hand Dominance   Dominant Hand: Right    Extremity/Trunk Assessment   Upper Extremity Assessment: Generalized weakness;Difficult to assess due to impaired cognition (Good grips strength.)           Lower Extremity Assessment: Generalized weakness;Difficult to assess due to impaired cognition         Communication   Communication: HOH  Cognition Arousal/Alertness: Lethargic Behavior During Therapy: Flat affect Overall Cognitive Status: Impaired/Different from baseline Area of Impairment: Orientation;Following commands;Safety/judgement;Problem solving;Memory Orientation Level: Disoriented to;Place;Time;Situation     Following Commands: Follows one step commands inconsistently Safety/Judgement: Decreased awareness of safety;Decreased awareness of deficits   Problem Solving: Decreased initiation;Slow processing;Difficulty sequencing;Requires verbal cues;Requires tactile cues General Comments: Constant repetition required to stay on task and follow simple 1 step commands. Pt very lethargic and seems more confused per family. Reports this happens when he comes into hospital.    General Comments General comments (skin integrity, edema, etc.): PT evaluation performed on RA, able to maintain Sa02 >94%. RN notified that 02  has been removed.    Exercises        Assessment/Plan    PT Assessment Patient needs continued PT services  PT Diagnosis Generalized weakness   PT Problem List Decreased strength;Pain;Decreased cognition;Decreased activity tolerance;Decreased balance;Decreased mobility;Decreased safety awareness  PT Treatment Interventions DME instruction;Balance training;Gait training;Patient/family education;Therapeutic activities;Therapeutic exercise;Functional mobility training;Cognitive remediation   PT Goals (Current goals can be found in the Care Plan section) Acute Rehab PT Goals Patient Stated Goal: none stated PT Goal Formulation: Patient unable to participate in goal setting Time For Goal Achievement: 06/28/14 Potential to Achieve Goals: Fair    Frequency Min 3X/week   Barriers to discharge        Co-evaluation               End of Session Equipment Utilized During Treatment: Gait belt Activity Tolerance: Patient limited by lethargy;Patient limited by pain Patient left: in bed;with call bell/phone within reach;with bed alarm set;with nursing/sitter in room;with family/visitor present Nurse Communication: Mobility status;Precautions         Time: 5465-0354 PT Time Calculation (min): 32 min   Charges:   PT Evaluation $Initial PT Evaluation Tier I: 1 Procedure PT Treatments $Therapeutic Activity: 8-22 mins   PT G CodesCandy Sledge A 06/21/2014, 1:12 PM Candy Sledge, [place], DPT [phone]

## 2014-06-21 NOTE — ED Notes (Signed)
Communication with Phlebotomy

## 2014-06-21 NOTE — Evaluation (Signed)
Clinical/Bedside Swallow Evaluation Patient Details  Name: [name] [name] MRN: [id number] Date of Birth: Jul 15, 1923  Today's Date: 06/21/2014 Time: 3212-2482 SLP Time Calculation (min): 30 min  Past Medical History:  Past Medical History  Diagnosis Date  . Hypertension   . Hyperlipidemia   . Carotid artery occlusion   . AAA (abdominal aortic aneurysm)   . Valvular heart disease   . Hyperlipidemia   . Cancer     "skin cancer removed from top of head"  . Arthritis     "in his spine"  . Refusal of blood transfusions as patient is Jehovah's Witness 08/31/11  . PTSD (post-traumatic stress disorder)   . Depression     "terrible; from the PTSD"  . Anxiety   . Nightmares   . Spinal stenosis   . Memory loss   . Osteoarthritis     bilateral knees  . Aortic sclerosis     insufficiency mild echo, 10/2007  . Hypercholesteremia   . Renal disease     stage lll  . Dementia, vascular Oct. 2013  . Atrial fibrillation   . Back pain   . Thyrotoxicosis     without mention of goiter or other cause, without mention of thyrotixic crisis or storm  . Orthostatic hypotension   . Cardiac dysrhythmia   . Kidney disorder   . Ureteral disorder   . Movement disorder   . Stroke 2013    X three   Past Surgical History:  Past Surgical History  Procedure Laterality Date  . Wrist surgery  2003    left; S/P fracture; "put a plate in it"  . Abdominal aortic aneurysm repair  2002  . Carotid endarterectomy  2004    left  . Total knee arthroplasty  2000's    bilaterally  . Cataract extraction w/ intraocular lens  implant, bilateral  ~ 2010  . Joint replacement  2003 and 2004    Right and left knee  . Replacement total knee  12/2006  . Eye surgery    . Fracture surgery Left     wrist   HPI:  80 y.o. with past medical history of dementia, Parkinson, stroke x3. PTSD, spinal stenosis, renal disease.  Per MD noted pt discharged from the hospital more than 3 months ago for community-acquired  pneumonia.  MBS4/28/15 revealed trace aspiration of thin via straw noted - small single boluses not aspirated. Decreased UES opening due to parkinsons's also contributed to pharyngeal stasis.  Dys 3 diet and thin liquids recommended.  CXR Mild right basilar airspace opacity could reflect mild pneumonia.    Assessment / Plan / Recommendation Clinical Impression  Pharygneal indications with thin liquids which were decreased with thickening liquids to nectar consisntency.  Increased aspiration risk includes history of dysphagia, Parkinson's and current decreased endurance.  Recommend diet/liquid modification to Dys 2, nectar liquids, pills whole in applesauce, no straws and MBS tomorrow to fully assess.  Daughter will bring dentures before supper tonight.    Aspiration Risk   (mod-severe)    Diet Recommendation Dysphagia 2 (Fine chop);Nectar-thick liquid   Liquid Administration via: Cup;No straw Medication Administration: Whole meds with puree Supervision: Full supervision/cueing for compensatory strategies;Staff to assist with self feeding Compensations: Slow rate;Small sips/bites;Check for pocketing Postural Changes and/or Swallow Maneuvers: Seated upright 90 degrees;Upright 30-60 min after meal    Other  Recommendations Recommended Consults: MBS Oral Care Recommendations: Oral care BID   Follow Up Recommendations   (TBD)    Frequency and Duration  Pertinent Vitals/Pain No indications pain         Swallow Study Prior Functional Status  Type of Home: House Available Help at Discharge: Family       Oral/Motor/Sensory Function Overall Oral Motor/Sensory Function:  (general mild weakness with Parkinson's and dec endurance)   Ice Chips Ice chips: Not tested   Thin Liquid Thin Liquid: Impaired Presentation: Cup Pharyngeal  Phase Impairments: Throat Clearing - Immediate;Throat Clearing - Delayed;Suspected delayed Swallow    Nectar Thick Nectar Thick Liquid:  Impaired Pharyngeal Phase Impairments: Throat Clearing - Delayed   Honey Thick Honey Thick Liquid: Not tested   Puree Puree: Impaired Presentation: Spoon Pharyngeal Phase Impairments: Suspected delayed Swallow   Solid   GO    Solid: Not tested       Houston Siren 06/21/2014,1:59 PM  Orbie Pyo Colvin Caroli.Ed [employer] [phone]

## 2014-06-21 NOTE — ED Notes (Signed)
PA at bedside.

## 2014-06-21 NOTE — ED Notes (Signed)
MD at bedside. Hospitilist

## 2014-06-21 NOTE — ED Notes (Signed)
Per EMS: pt from home, he woke up at 5am and told his wife he needed to use the bathroom, no mentation change during this and around 0515 his wife noticed the patient was not answering questions appropriately and confused. Upon EMS arrival pt was supine on the bed, eyes rolled back in head and only responsive to painful stimuli. When pt was placed on stretcher and in a semi fowlers position the patient then started to answer questions more appropriately. Pt still confused at this time. Hx of dementia and parkinson's. Wife states the patient is usually very conversational but this is a change from baseline. BP-176/86, NSR 80, 93% RA, CBG-200.

## 2014-06-21 NOTE — ED Notes (Signed)
Family at bedside. 

## 2014-06-21 NOTE — H&P (Signed)
[facility] History and Physical  STEFEN JUBA RZZ:[id number] DOB: 11-Nov-1922 DOA: 06/21/2014  Referring physician: Dr. Jeraldine Loots PCP: Ginette Otto, MD   Chief Complaint: confusion  HPI: [name] [name] is a 80 y.o. male  With past medical history of dementia, Parkinson, discharge from the hospital more than 3 months ago for community-acquired pneumonia, at that time swallowing evaluation was done and recommended a dysphagia 1 diet that comes in for confusion distorted 2 days prior to admission. The patient cannot give history and most of the history is obtained from the daughter and wife. They relate he has been slowly declining over the last here, he's had episodes of coughing with eating. Dr. Pete Glatter who is his primary care doctor met with him about a week ago and did discuss its CODE STATUS. Which is a DNR/DNI. But the wife is not ready at this time to withdraw treatment from him. She was to light and treated conservatively without any intubation or pressors. On the day of admission the patient couldn't get out of bed and was breathing more rapidly so she called EMS and was brought here to the ED. Denies any fever, chills nausea vomiting or diarrhea.   In the ED: Chest x-ray was done that shows a right lower lobe infiltrate with leukocytosis we're consulted for further evaluation.  Review of Systems:  Constitutional:  No weight loss, night sweats, Fevers, chills, fatigue.  HEENT:  No headaches, Difficulty swallowing,Tooth/dental problems,Sore throat,  No sneezing, itching, ear ache, nasal congestion, post nasal drip,  Cardio-vascular:  No chest pain, Orthopnea, PND, swelling in lower extremities, anasarca, dizziness, palpitations  GI:  No heartburn, indigestion, abdominal pain, nausea, vomiting, diarrhea, change in bowel habits, loss of appetite  Resp:  No shortness of breath with exertion or at rest. No excess mucus, no productive cough, No non-productive cough, No  coughing up of blood.No change in color of mucus.No wheezing.No chest wall deformity  Skin:  no rash or lesions.  GU:  no dysuria, change in color of urine, no urgency or frequency. No flank pain.  Musculoskeletal:  No joint pain or swelling. No decreased range of motion. No back pain.    Past Medical History  Diagnosis Date  . Hypertension   . Hyperlipidemia   . Carotid artery occlusion   . AAA (abdominal aortic aneurysm)   . Valvular heart disease   . Hyperlipidemia   . Cancer     "skin cancer removed from top of head"  . Arthritis     "in his spine"  . Refusal of blood transfusions as patient is Jehovah's Witness 08/31/11  . PTSD (post-traumatic stress disorder)   . Depression     "terrible; from the PTSD"  . Anxiety   . Nightmares   . Spinal stenosis   . Memory loss   . Osteoarthritis     bilateral knees  . Aortic sclerosis     insufficiency mild echo, 10/2007  . Hypercholesteremia   . Renal disease     stage lll  . Dementia, vascular Oct. 2013  . Atrial fibrillation   . Back pain   . Thyrotoxicosis     without mention of goiter or other cause, without mention of thyrotixic crisis or storm  . Orthostatic hypotension   . Cardiac dysrhythmia   . Kidney disorder   . Ureteral disorder   . Movement disorder   . 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  Wrist surgery  2003    left; S/P fracture; "put a plate in it"  . Abdominal aortic aneurysm repair  2002  . Carotid endarterectomy  2004    left  . Total knee arthroplasty  2000's    bilaterally  . Cataract extraction w/ intraocular lens  implant, bilateral  ~ 2010  . Joint replacement  2003 and 2004    Right and left knee  . Replacement total knee  12/2006  . Eye surgery    . Fracture surgery Left     wrist   Social History:  reports that he has quit smoking. His smoking use included Cigarettes. He smoked 0.00 packs per day. He has never used smokeless tobacco. He  reports that he does not drink alcohol or use illicit drugs.  Allergies  Allergen Reactions  . Aceon [Perindopril]     diarrhea  . Morphine And Related     Causes confusion    Family History  Problem Relation Age of Onset  . Heart disease Father     Heart Disease before age 90  . Heart attack Father   . Stroke Mother   . Arthritis Mother   . Hypertension Mother   . Heart attack Mother   . Diabetes Maternal Aunt   . Diabetes Brother   . Heart attack Brother   . Heart attack Sister      Prior to Admission medications   Medication Sig Start Date End Date Taking? Authorizing Provider  acetaminophen (TYLENOL) 325 MG tablet Take 2 tablets (650 mg total) by mouth every 6 (six) hours as needed (or Fever >/= 101). 08/05/12   Srikar Janna Arch, MD  amLODipine (NORVASC) 5 MG tablet Take 5 mg by mouth daily.    Historical Provider, MD  aspirin 81 MG tablet Take 81 mg by mouth daily.      Historical Provider, MD  buPROPion (WELLBUTRIN SR) 100 MG 12 hr tablet Take 100 mg by mouth 2 (two) times daily.    Historical Provider, MD  carbidopa-levodopa (SINEMET IR) 25-100 MG per tablet Take 1 tablet by mouth 3 (three) times daily.    Historical Provider, MD  donepezil (ARICEPT) 10 MG tablet Take 5 mg by mouth daily.    Historical Provider, MD  furosemide (LASIX) 40 MG tablet Take 1 tablet (40 mg total) by mouth daily. 03/08/14   Domenic Polite, MD  levofloxacin (LEVAQUIN) 500 MG tablet Take 1 tablet (500 mg total) by mouth daily. For 3 days 03/08/14   Domenic Polite, MD  lisinopril (PRINIVIL,ZESTRIL) 5 MG tablet Take 5 mg by mouth daily as needed. For blood pressure greater then 150 07/11/12   Historical Provider, MD  senna-docusate (SENOKOT-S) 8.6-50 MG per tablet Take 1 tablet by mouth at bedtime as needed for mild constipation. 03/08/14   Domenic Polite, MD   Physical Exam: Filed Vitals:   06/21/14 0800 06/21/14 0845 06/21/14 0900 06/21/14 1000  BP: 185/69 177/65 186/64 180/63  Pulse: 77 73 71 76    Temp:      TempSrc:      Resp: $Remo'19 16 16 18  'wPBFP$ SpO2: 100% 99% 100% 99%    Wt Readings from Last 3 Encounters:  03/08/14 73.5 kg (162 lb 0.6 oz)  07/28/13 73.936 kg (163 lb)  05/10/13 76.658 kg (169 lb)    General:  Appears  comfortable, Cachectic. Only able to arouse him to painful stimuli Eyes: PERRL, normal lids, irises & conjunctiva ENT: grossly normal hearing, lips & tongue Neck: no  LAD, masses or thyromegaly Cardiovascular: RRR, no m/r/g. No LE edema. Telemetry: SR, no arrhythmias  Respiratory: Good air movement with crackles on the right Abdomen: soft, ntnd Skin: no rash or induration seen on limited exam Musculoskeletal: grossly normal tone BUE/BLE            Labs on Admission:  Basic Metabolic Panel:  Recent Labs Lab 06/21/14 0630  NA 144  K 3.8  CL 104  CO2 24  GLUCOSE 164*  BUN 21  CREATININE 0.99  CALCIUM 9.7   Liver Function Tests:  Recent Labs Lab 06/21/14 0630  AST 18  ALT <5  ALKPHOS 87  BILITOT 0.3  PROT 6.9  ALBUMIN 3.4*   No results found for this basename: LIPASE, AMYLASE,  in the last 168 hours No results found for this basename: AMMONIA,  in the last 168 hours CBC:  Recent Labs Lab 06/21/14 0630  WBC 10.9*  NEUTROABS 9.5*  HGB 13.9  HCT 43.4  MCV 90.0  PLT 139*   Cardiac Enzymes: No results found for this basename: CKTOTAL, CKMB, CKMBINDEX, TROPONINI,  in the last 168 hours  BNP (last 3 results)  Recent Labs  03/02/14 0503  PROBNP 1416.0*   CBG:  Recent Labs Lab 06/21/14 0643  GLUCAP 144*    Radiological Exams on Admission: Dg Chest Port 1 View  06/21/2014   CLINICAL DATA:  Altered mental status.  EXAM: PORTABLE CHEST - 1 VIEW  COMPARISON:  Chest radiograph performed 03/05/2014  FINDINGS: The lungs are well-aerated. Mild right basilar airspace opacity could reflect mild pneumonia. Pulmonary vascularity is at the upper limits of normal. No pleural effusion or pneumothorax is seen.  The cardiomediastinal  silhouette is borderline normal in size. Calcification is noted within the aortic arch. No acute osseous abnormalities are seen.  IMPRESSION: Mild right basilar airspace opacity could reflect mild pneumonia.   Electronically Signed   By: Garald Balding M.D.   On: 06/21/2014 06:58    EKG: Independently reviewed.  sinus rhythm normal axis nonspecific T-wave changes  Assessment/Plan Acute respiratory failure with hypoxia due to Aspiration pneumonia/ Leukocytosis: - Had a swallowing evaluation last time he was in the hospital, and at that time he was having episodes of aspiration. As per daughter and wife it seems that he has progressively gotten worse. Check swallowing evaluation and Placed him n.p.o. - Start him on D5 half-normal saline Per 24 hours, Blood cultures already done by emergency room physician. - . He got one dose of Rocephin and azithromycin, he is on oxygen and sating greater than 90% on 2 L. We'll change his antibiotic to Unasyn. - Lactic acid was checked and was 2.1, his vitals have been stable. - It is to note that the patient was taken off all of his blood pressure medication about 2 weeks ago. He is only taking Norvasc 2.5 mg when necessary for systolic blood pressure greater than 180. His blood pressure bottoms out if he is getting more than that , as per family. - PT consult Acute encephalopathy  Dementia with behavioral disturbance - Continue his Aricept. His encephalopathy and confusion is probably due to his infectious/Inflammatory etiology. - Seroquelwhen necessary and Haldol for agitation his EKG showed QTC of less than 500    Protein-calorie malnutrition, severe - N.p.o. Ensure 3 times a day when patient is able to swallow and swallowing evaluation done.   Code Status: full DVT Prophylaxis: heparin Family Communication: wife Disposition Plan: inpatient  Time spent: 68 minutes  FELIZ  Marguarite Arbour [facility] Pager [phone]

## 2014-06-22 ENCOUNTER — Inpatient Hospital Stay (HOSPITAL_COMMUNITY): Payer: Medicare Other

## 2014-06-22 DIAGNOSIS — R21 Rash and other nonspecific skin eruption: Secondary | ICD-10-CM | POA: Diagnosis present

## 2014-06-22 DIAGNOSIS — J189 Pneumonia, unspecified organism: Secondary | ICD-10-CM

## 2014-06-22 LAB — COMPREHENSIVE METABOLIC PANEL
ALBUMIN: 2.9 g/dL — AB (ref 3.5–5.2)
ALK PHOS: 71 U/L (ref 39–117)
ANION GAP: 11 (ref 5–15)
AST: 17 U/L (ref 0–37)
BILIRUBIN TOTAL: 0.3 mg/dL (ref 0.3–1.2)
BUN: 16 mg/dL (ref 6–23)
CHLORIDE: 105 meq/L (ref 96–112)
CO2: 27 mEq/L (ref 19–32)
Calcium: 9.3 mg/dL (ref 8.4–10.5)
Creatinine, Ser: 0.78 mg/dL (ref 0.50–1.35)
GFR calc Af Amer: >90 mL/min (ref >90)
GFR calc non Af Amer: 78 mL/min — ABNORMAL LOW (ref >90)
Glucose, Bld: 96 mg/dL (ref 70–99)
POTASSIUM: 3.6 meq/L — AB (ref 3.7–5.3)
SODIUM: 143 meq/L (ref 137–147)
TOTAL PROTEIN: 5.9 g/dL — AB (ref 6.0–8.3)

## 2014-06-22 MED ORDER — PIPERACILLIN-TAZOBACTAM 3.375 G IVPB
3.3750 g | Freq: Three times a day (TID) | INTRAVENOUS | Status: DC
  Administered 2014-06-22 – 2014-06-26 (×13): 3.375 g via INTRAVENOUS
  Filled 2014-06-22 (×16): qty 50

## 2014-06-22 MED ORDER — MICONAZOLE NITRATE 2 % EX CREA
TOPICAL_CREAM | Freq: Two times a day (BID) | CUTANEOUS | Status: DC
  Administered 2014-06-22 (×2): via TOPICAL
  Filled 2014-06-22: qty 14

## 2014-06-22 MED ORDER — SODIUM CHLORIDE 0.9 % IV SOLN
INTRAVENOUS | Status: AC
  Administered 2014-06-22: 16:00:00 via INTRAVENOUS

## 2014-06-22 MED ORDER — ENSURE PUDDING PO PUDG
1.0000 | Freq: Three times a day (TID) | ORAL | Status: DC
  Administered 2014-06-22 – 2014-06-26 (×6): 1 via ORAL

## 2014-06-22 MED ORDER — POTASSIUM CHLORIDE CRYS ER 20 MEQ PO TBCR
20.0000 meq | EXTENDED_RELEASE_TABLET | Freq: Two times a day (BID) | ORAL | Status: DC
  Administered 2014-06-22: 20 meq via ORAL
  Filled 2014-06-22: qty 1

## 2014-06-22 MED ORDER — HYDRALAZINE HCL 20 MG/ML IJ SOLN
10.0000 mg | Freq: Four times a day (QID) | INTRAMUSCULAR | Status: DC | PRN
  Administered 2014-06-22 – 2014-06-25 (×3): 10 mg via INTRAVENOUS
  Filled 2014-06-22 (×3): qty 1

## 2014-06-22 MED ORDER — NYSTATIN 100000 UNIT/GM EX CREA
TOPICAL_CREAM | Freq: Three times a day (TID) | CUTANEOUS | Status: DC
  Administered 2014-06-22: 22:00:00 via TOPICAL
  Administered 2014-06-23 – 2014-06-24 (×4): 1 via TOPICAL
  Administered 2014-06-25: 10:00:00 via TOPICAL
  Administered 2014-06-25: 1 via TOPICAL
  Administered 2014-06-25: 22:00:00 via TOPICAL
  Administered 2014-06-26: 1 via TOPICAL
  Filled 2014-06-22 (×2): qty 15

## 2014-06-22 NOTE — Progress Notes (Signed)
INITIAL NUTRITION ASSESSMENT  DOCUMENTATION CODES Per approved criteria  -Not Applicable   INTERVENTION: Ensure Pudding po TID, each supplement provides 170 kcal and 4 grams of protein RD to follow for nutrition care pla  NUTRITION DIAGNOSIS: Inadequate oral intake related to dysphagia, advanced age as evidenced by PO intake 25%  Goal: Pt to meet >/= 90% of their estimated nutrition needs   Monitor:  PO & supplemental intake, weight, labs, I/O's  Reason for Assessment: Malnutrition Screening Tool Report  80 y.o. male  Admitting Dx: Aspiration pneumonia  ASSESSMENT: Patient with PMH of dementia, Parkinson, discharged from the hospital more than 3 months ago for community-acquired pneumonia; admitted with confusion and episodes of coughing with eating.   RD spoke with pt's son & daughter at bedside; report patient ate little at lunch today; they are unable to give accurate diet recall as they do not live with their Father and also live out of town; PO intake 25% per flowsheet records; pt s/p MBSS this AM and demonstrated severe pharyngeal phase dysphagia.  SLP recommending Dys 2, thin liquid diet; patient's daughter feels patient has lost ~ 80 lbs in < 1 year; per wt readings, patient has had a 15% weight loss since June 2015 -- severe for time frame; family amenable to RD ordering oral nutrition supplements (ie Ensure Pudding).    RD unable to complete Nutrition Focused Physical Exam at this time.  Height: Ht Readings from Last 1 Encounters:  06/21/14 5\' 8"  (1.727 m)    Weight: Wt Readings from Last 1 Encounters:  06/21/14 137 lb 8 oz (62.37 kg)    Ideal Body Weight: 154 lb  % Ideal Body Weight: 89%  Wt Readings from Last 10 Encounters:  06/21/14 137 lb 8 oz (62.37 kg)  03/08/14 162 lb 0.6 oz (73.5 kg)  07/28/13 163 lb (73.936 kg)  05/10/13 169 lb (76.658 kg)  05/09/13 172 lb 6.4 oz (78.2 kg)  08/05/12 172 lb (78.019 kg)  07/19/12 174 lb 6.4 oz (79.107 kg)   08/31/11 188 lb (85.276 kg)  07/07/11 188 lb (85.276 kg)    Usual Body Weight: 162 lb  % Usual Body Weight: 85%  BMI:  Body mass index is 20.91 kg/(m^2).  Estimated Nutritional Needs: Kcal: 1500-1700 Protein: 75-85 gm Fluid: 1.5-1.7 L  Skin: Intact  Diet Order: Dysphagia 2, thin liquids  EDUCATION NEEDS: -No education needs identified at this time   Intake/Output Summary (Last 24 hours) at 06/22/14 1610 Last data filed at 06/22/14 1446  Gross per 24 hour  Intake 676.67 ml  Output    200 ml  Net 476.67 ml    Labs:   Recent Labs Lab 06/21/14 0630 06/22/14 0638  NA 144 143  K 3.8 3.6*  CL 104 105  CO2 24 27  BUN 21 16  CREATININE 0.99 0.78  CALCIUM 9.7 9.3  GLUCOSE 164* 96    CBG (last 3)   Recent Labs  06/21/14 0643  GLUCAP 144*    Scheduled Meds: . carbidopa-levodopa  1 tablet Oral TID  . donepezil  5 mg Oral Daily  . heparin  5,000 Units Subcutaneous 3 times per day  . nystatin cream   Topical TID  . piperacillin-tazobactam (ZOSYN)  IV  3.375 g Intravenous 3 times per day    Continuous Infusions: . sodium chloride 50 mL/hr at 06/22/14 1530    Past Medical History  Diagnosis Date  . Hypertension   . Hyperlipidemia   . Carotid artery occlusion   .  AAA (abdominal aortic aneurysm)   . Valvular heart disease   . Hyperlipidemia   . Cancer     "skin cancer removed from top of head"  . Arthritis     "in his spine"  . Refusal of blood transfusions as patient is Jehovah's Witness 08/31/11  . PTSD (post-traumatic stress disorder)   . Depression     "terrible; from the PTSD"  . Anxiety   . Nightmares   . Spinal stenosis   . Memory loss   . Osteoarthritis     bilateral knees  . Aortic sclerosis     insufficiency mild echo, 10/2007  . Hypercholesteremia   . Renal disease     stage lll  . Dementia, vascular Oct. 2013  . Atrial fibrillation   . Back pain   . Thyrotoxicosis     without mention of goiter or other cause, without  mention of thyrotixic crisis or storm  . Orthostatic hypotension   . Cardiac dysrhythmia   . Kidney disorder   . Ureteral disorder   . Movement disorder   . 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 Transfusion of blood product refused for religious reason   . Neuromuscular disorder     parkinsons  . Macular degeneration     Past Surgical History  Procedure Laterality Date  . Wrist surgery  2003    left; S/P fracture; "put a plate in it"  . Abdominal aortic aneurysm repair  2002  . Carotid endarterectomy  2004    left  . Total knee arthroplasty  2000's    bilaterally  . Cataract extraction w/ intraocular lens  implant, bilateral  ~ 2010  . Joint replacement  2003 and 2004    Right and left knee  . Replacement total knee  12/2006  . Eye surgery    . Fracture surgery Left     wrist    Arthur Holms, RD, LDN Pager #: [phone] After-Hours Pager #: [phone]

## 2014-06-22 NOTE — Progress Notes (Signed)
Physical Therapy Treatment Patient Details Name: [name] [name] MRN: [id number] DOB: 16-Dec-1922 Today's Date: 06/22/2014    History of Present Illness Patient is a 80 y/o male with PMH of dementia and PD's discharged from the hospital more than 3 months ago for community-acquired pneumonia presents with slow decline over the last couple weeks where he's had episodes of coughing with eating. Per family, pt couldn't get out of bed the day of admission and was breathing more rapidly so wife called EMS. Chest xray-right lower lobe infiltrate with leukocytosis.    PT Comments    Pt continues to appear confused today. Required max cues for safety and redirection; pt easily distractible. 2 person (A) required for SPT and peri-care during session. Cont to recommend SNF for post acute rehab due to lack of progress and incr (A) needed for mobility and transfers at this time.   Follow Up Recommendations  SNF;Supervision/Assistance - 24 hour     Equipment Recommendations  Other (comment) (TBD)    Recommendations for Other Services       Precautions / Restrictions Precautions Precautions: Fall Restrictions Weight Bearing Restrictions: No    Mobility  Bed Mobility Overal bed mobility: Needs Assistance Bed Mobility: Supine to Sit     Supine to sit: Supervision;HOB elevated     General bed mobility comments: no physical (A) needed today; pt relied heavily on handrails and cues for safety  Transfers Overall transfer level: Needs assistance Equipment used: Rolling walker (2 wheeled) Transfers: Sit to/from [facility] Sit to Stand: Mod assist;+2 physical assistance Stand pivot transfers: +2 physical assistance;Max assist       General transfer comment: performed sit to stand transfer x 3; pt with incontinence and having BM; cues for safety and hand placement; pt disregarding cues and attempting to pull up on RW each sit to stand; difficulty sequencing pivotal steps to  chair and required max facilitation to perform  Ambulation/Gait             General Gait Details: pivotal steps to chair only   Stairs            Wheelchair Mobility    Modified Rankin (Stroke Patients Only)       Balance Overall balance assessment: Needs assistance Sitting-balance support: Feet supported Sitting balance-Leahy Scale: Fair Sitting balance - Comments: sat EOB without (A) at supervision level for safety; pt appeared to be dizzy and was swaying; tolerated sitting EOB ~8 min for BP and to assess dizziness  Postural control: Posterior lean Standing balance support: During functional activity;Bilateral upper extremity supported Standing balance-Leahy Scale: Zero Standing balance comment: pt required 2 person (A) and bil UE support to stand; pt leaning fwd and required max cues for erect posture; pt bracing LEs on bed for balance also; 2 person required for safety with peri-care; pt with loose BM upon standing and RN notified                     Cognition Arousal/Alertness: Awake/alert Behavior During Therapy: Flat affect;Impulsive Overall Cognitive Status: Impaired/Different from baseline Area of Impairment: Orientation;Following commands;Safety/judgement;Awareness;Problem solving Orientation Level: Disoriented to;Situation;Place     Following Commands: Follows one step commands with increased time Safety/Judgement: Decreased awareness of safety;Decreased awareness of deficits Awareness: Intellectual Problem Solving: Difficulty sequencing;Requires verbal cues;Requires tactile cues General Comments: pt confabulating and confused throughout session; max cues and redirection required for simple tasks     Exercises      General Comments  Pertinent Vitals/Pain Pain Assessment: No/denies pain    Home Living                      Prior Function            PT Goals (current goals can now be found in the care plan section)  Acute Rehab PT Goals Patient Stated Goal: "to figure out who that german is" PT Goal Formulation: With patient Time For Goal Achievement: 06/28/14 Potential to Achieve Goals: Fair Progress towards PT goals: Progressing toward goals    Frequency  Min 2X/week    PT Plan Discharge plan needs to be updated    Co-evaluation             End of Session Equipment Utilized During Treatment: Gait belt Activity Tolerance: Patient tolerated treatment well Patient left: in chair;with call bell/phone within reach;with family/visitor present     Time: 1451-1515 PT Time Calculation (min): 24 min  Charges:  $Therapeutic Activity: 23-37 mins                    G CodesGustavus Bryant, [place]  [phone] 06/22/2014, 4:31 PM

## 2014-06-22 NOTE — ED Provider Notes (Signed)
  This was a shared visit with a mid-level provided (NP or PA).  Throughout the patient's course I was available for consultation/collaboration.  I saw the ECG (if appropriate), relevant labs and studies - I agree with the interpretation.  On my exam the patient was in no distress, sleeping, though he was tachypneic, with coarse breath sounds. After discussion with the patient's family it is clear the patient has a history of dysphagia, prior aspiration event, and this is a concern today. With the description    no atypical behavior for the patient, x-ray evidence for pneumonia, patient was admitted for further evaluation, management.  EKG showed rate 87, sinus rhythm, no acute changes, unremarkable   Carmin Muskrat, MD 06/22/14 [phone]

## 2014-06-22 NOTE — Procedures (Signed)
Objective Swallowing Evaluation: Modified Barium Swallowing Study  Patient Details  Name: [name] [name] MRN: [id number] Date of Birth: [birth year]  Today's Date: 06/22/2014 Time: 1015-1055 SLP Time Calculation (min): 40 min  Past Medical History:  Past Medical History  Diagnosis Date  . Hypertension   . Hyperlipidemia   . Carotid artery occlusion   . AAA (abdominal aortic aneurysm)   . Valvular heart disease   . Hyperlipidemia   . Cancer     "skin cancer removed from top of head"  . Arthritis     "in his spine"  . Refusal of blood transfusions as patient is Jehovah's Witness 08/31/11  . PTSD (post-traumatic stress disorder)   . Depression     "terrible; from the PTSD"  . Anxiety   . Nightmares   . Spinal stenosis   . Memory loss   . Osteoarthritis     bilateral knees  . Aortic sclerosis     insufficiency mild echo, 10/2007  . Hypercholesteremia   . Renal disease     stage lll  . Dementia, vascular Oct. 2013  . Atrial fibrillation   . Back pain   . Thyrotoxicosis     without mention of goiter or other cause, without mention of thyrotixic crisis or storm  . Orthostatic hypotension   . Cardiac dysrhythmia   . Kidney disorder   . Ureteral disorder   . Movement disorder   . 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 Transfusion of blood product refused for religious reason   . Neuromuscular disorder     parkinsons  . Macular degeneration    Past Surgical History:  Past Surgical History  Procedure Laterality Date  . Wrist surgery  2003    left; S/P fracture; "put a plate in it"  . Abdominal aortic aneurysm repair  2002  . Carotid endarterectomy  2004    left  . Total knee arthroplasty  2000's    bilaterally  . Cataract extraction w/ intraocular lens  implant, bilateral  ~ 2010  . Joint replacement  2003 and 2004    Right and left knee  . Replacement total knee  12/2006  . Eye surgery    . Fracture surgery Left     wrist   HPI:  80 y.o. with past medical history of  dementia, Parkinson, stroke x3. PTSD, spinal stenosis, renal disease.  Per MD noted pt discharged from the hospital more than 3 months ago for community-acquired pneumonia.  MBS4/28/15 revealed trace aspiration of thin via straw noted - small single boluses not aspirated. Decreased UES opening due to parkinsons's also contributed to pharyngeal stasis.  Dys 3 diet and thin liquids recommended.  CXR Mild right basilar airspace opacity could reflect mild pneumonia.      Assessment / Plan / Recommendation Clinical Impression  Dysphagia Diagnosis: Mild oral phase dysphagia;Severe pharyngeal phase dysphagia Clinical impression: Results of MBS are similar to results of MBS on 01/30/14 (mildy worse).  Significantly decreased tongue base retraction and laryneal elevation continue to result in maximum vallecular and pyriform sinus residue with inability to elicit volitional swallows during study.  Laryngeal penetration observed with honey, nectar and thin liquids during but mostly after the swallow from residuals.   Multiple strategies introduced to decrease residue and aid in airway protection were ineffective due to pt.'s weakness and inability to initiate dry swallows.  Wife present, educated with discussion of previous and current MBS and recommendations.  Unfortunately aspiration risk is high and imminent  with all po's.  Wife is aware of risks (as with previous MBS) and understandably prefers po's for quality of life. SLP discussed that pt. will likely continue to have pna. Wife stated that "the code status is wrong in his chart" and would like to speak with hospital MD to clarify.  Wife familiar with Palliative care from daughter who passed away last year.  Would Palliative care be beneficial for [name]?  SP recommending continue with Dys 2 diet texture and upgrade liquids to thin (nectar = excessive residue), sit upright, small sips, no straws, pt. to attempt 2 swallows and volitional coughs, crushed pills.        Treatment Recommendation  Therapy as outlined in treatment plan below    Diet Recommendation Dysphagia 2 (Fine chop);Thin liquid   Liquid Administration via: Cup;No straw Medication Administration: Crushed with puree Supervision: Full supervision/cueing for compensatory strategies;Staff to assist with self feeding Compensations: Slow rate;Small sips/bites;Check for pocketing;Multiple dry swallows after each bite/sip;Hard cough after swallow Postural Changes and/or Swallow Maneuvers: Seated upright 90 degrees;Upright 30-60 min after meal    Other  Recommendations Oral Care Recommendations: Oral care BID   Follow Up Recommendations   (TBD)    Frequency and Duration min 1 x/week  2 weeks   Pertinent Vitals/Pain No pain           Reason for Referral Objectively evaluate swallowing function   Oral Phase Oral Preparation/Oral Phase Oral Phase: Impaired Oral - Honey Oral - Honey Cup: Delayed oral transit;Weak lingual manipulation Oral - Solids Oral - Puree: Weak lingual manipulation;Delayed oral transit   Pharyngeal Phase Pharyngeal Phase Pharyngeal Phase: Impaired Pharyngeal - Honey Pharyngeal - Honey Teaspoon: Pharyngeal residue - valleculae;Pharyngeal residue - pyriform sinuses;Reduced tongue base retraction;Reduced laryngeal elevation (unable to initiate second swallow) Pharyngeal - Nectar Pharyngeal - Nectar Teaspoon: Pharyngeal residue - valleculae;Reduced tongue base retraction;Penetration/Aspiration during swallow Penetration/Aspiration details (nectar teaspoon): Material enters airway, remains ABOVE vocal cords and not ejected out Pharyngeal - Nectar Cup: Pharyngeal residue - valleculae;Pharyngeal residue - pyriform sinuses;Reduced tongue base retraction;Reduced laryngeal elevation;Penetration/Aspiration during swallow Penetration/Aspiration details (nectar cup): Material enters airway, CONTACTS cords and not ejected out;Material enters airway, passes BELOW cords  without attempt by patient to eject out (silent aspiration) Pharyngeal - Thin Pharyngeal - Thin Teaspoon: Pharyngeal residue - valleculae;Pharyngeal residue - pyriform sinuses;Reduced laryngeal elevation;Reduced tongue base retraction;Penetration/Aspiration during swallow Penetration/Aspiration details (thin teaspoon): Material enters airway, CONTACTS cords and not ejected out Pharyngeal - Thin Cup: Pharyngeal residue - pyriform sinuses;Pharyngeal residue - valleculae;Reduced tongue base retraction;Reduced laryngeal elevation;Penetration/Aspiration during swallow Penetration/Aspiration details (thin cup): Material enters airway, passes BELOW cords without attempt by patient to eject out (silent aspiration);Material enters airway, CONTACTS cords and not ejected out Pharyngeal - Solids Pharyngeal - Puree: Pharyngeal residue - valleculae;Reduced tongue base retraction  Cervical Esophageal Phase    GO    Cervical Esophageal Phase Cervical Esophageal Phase: Darryll Capers         Houston Siren 06/22/2014, 11:39 AM  Orbie Pyo Colvin Caroli.Ed [employer] [phone]

## 2014-06-22 NOTE — Progress Notes (Signed)
[facility] PROGRESS NOTE  [name] [name] YTK:[id number] DOB: 01-30-1923 DOA: 06/21/2014 PCP: Mathews Argyle, MD  Assessment/Plan:  Acute respiratory failure with hypoxia  -Evident by requirement of oxygen- weaned off [place], currently on RA O2 sat 98%.   -Likely due to aspiration pneumonia -continue to monitor   Pneumonia -afebrile, no leukocytosis- improvement in respiratory status, weaned off [place] to RA -Swallowing study- dysphagia 2, think liquid diet  -Initially placed on IV Rocephin and Azithromycin, which was then switched to Unasyn and switched to Zosyn. -Blood cultures pending -PT recommendation of SNF but wife insists on taking him home  Hypetension -Family states patient currently only on Norvasc 2.5 and recently taken off of other BP meds because of hypotension - PRN hydralazine with holding paramaters.  Hypokalemia -mild at 3.6 -given one dose KDUR 20 meq PO  Acute encephalopathy Dementia with behavioral disturbance  -Confusion is probably due to pneumonia.  -UA negative, urine cultures pending -Continue Aricept - Seroquelwhen necessary and Haldol for agitation  Groin Rash -small confined area in penile region with erythema. No wound, discharge, or satellite lesions -nystatin cream TID  Protein-calorie malnutrition, severe  - N.p.o. Ensure 3 times   DVT Prophylaxis:   Code Status: Full- discussed with son and daughter at length about code status and son confirmed that according to his understanding father is DNR.  They have expressed their concern that mother is not aggreable to DNR. Mother was not at bedside to give her input.   Family Communication: Wife at bedside Disposition Plan: Inpatient, SNF when stable- Family likely doesn't desire SNF and would rather take him home with home health PT   Consultants:  None  Procedures:  None  Antibiotics: Rocephin and azithromycin Unasyn Zosyn (day 1)  HPI/Subjective:  [name] [name] is a 80  y.o. male with PMH of dementia, Parkinson, recent admission (discharge from [facility] 03/08/14) for pneumonia who presented via EMS with weakness and rapid breathing. Patient accopamied by wife and daughter that aid with history.  He denied any fever, chills, nausea, vomiting, or diarrhea. During previous hospitalization, dysphagia 1 diet was recommended after swallowing evaluation. Family relates he has been slowly declining and that he's had episodes of coughing with eating. Patient is admitted and found to have aspiration pneumonia. He is treated with IV antibiotics.    Objective: No complaints  Filed Vitals:   06/22/14 1402  BP: 185/63  Pulse: 63  Temp: 98 F (36.7 C)  Resp: 20    Intake/Output Summary (Last 24 hours) at 06/22/14 1426 Last data filed at 06/22/14 1328  Gross per 24 hour  Intake 576.67 ml  Output    200 ml  Net 376.67 ml   Filed Weights   06/21/14 1132  Weight: 62.37 kg (137 lb 8 oz)    Exam:  Gen: Awake and Alert Caucasian male in NAD. 2L [place] O2 sat 98% HEENT: Normocephalic, atraumatic.  Pupils symmertrical.  Moist mucosa.   Chest: clear to auscultate bilaterally, no ronchi or rales  Cardiac: Regular rate and rhythm, S1-S2, no rubs murmurs or gallops  Abdomen: soft, non tender, non distended, +bowel sounds. No guarding or rigidity  Genitourinary:  Penis- small confined area of erythema.  No swelling, pain, discharge, or satellite lesions Extremities: Symmetrical in appearance without cyanosis or edema  Neurological: sleepy but arousable, follows commands Psychiatric: Appears normal. Unable to assess- sleeping.  Data Reviewed: Basic Metabolic Panel:  Recent Labs Lab 06/21/14 0630 06/22/14 0638  NA 144 143  K 3.8  3.6*  CL 104 105  CO2 24 27  GLUCOSE 164* 96  BUN 21 16  CREATININE 0.99 0.78  CALCIUM 9.7 9.3   Liver Function Tests:  Recent Labs Lab 06/21/14 0630 06/22/14 0638  AST 18 17  ALT <5 <5  ALKPHOS 87 71  BILITOT 0.3 0.3  PROT 6.9 5.9*   ALBUMIN 3.4* 2.9*   No results found for this basename: LIPASE, AMYLASE,  in the last 168 hours No results found for this basename: AMMONIA,  in the last 168 hours CBC:  Recent Labs Lab 06/21/14 0630  WBC 10.9*  NEUTROABS 9.5*  HGB 13.9  HCT 43.4  MCV 90.0  PLT 139*   Cardiac Enzymes: No results found for this basename: CKTOTAL, CKMB, CKMBINDEX, TROPONINI,  in the last 168 hours BNP (last 3 results)  Recent Labs  03/02/14 0503  PROBNP 1416.0*   CBG:  Recent Labs Lab 06/21/14 0643  GLUCAP 144*    Recent Results (from the past 240 hour(s))  CULTURE, BLOOD (ROUTINE X 2)     Status: None   Collection Time    06/21/14  6:30 AM      Result Value Ref Range Status   Specimen Description BLOOD FOREARM RIGHT   Final   Special Requests     Final   Value: BOTTLES DRAWN AEROBIC AND ANAEROBIC BLUE 3ML RED 4ML   Culture  Setup Time     Final   Value: 06/21/2014 13:12     Performed at [facility]   Culture     Final   Value:        BLOOD CULTURE RECEIVED NO GROWTH TO DATE CULTURE WILL BE HELD FOR 5 DAYS BEFORE ISSUING A FINAL NEGATIVE REPORT     Performed at [facility]   Report Status PENDING   Incomplete  CULTURE, BLOOD (ROUTINE X 2)     Status: None   Collection Time    06/21/14  8:20 AM      Result Value Ref Range Status   Specimen Description BLOOD RIGHT ANTECUBITAL   Final   Special Requests BOTTLES DRAWN AEROBIC AND ANAEROBIC 5ML   Final   Culture  Setup Time     Final   Value: 06/21/2014 13:12     Performed at [facility]   Culture     Final   Value:        BLOOD CULTURE RECEIVED NO GROWTH TO DATE CULTURE WILL BE HELD FOR 5 DAYS BEFORE ISSUING A FINAL NEGATIVE REPORT     Performed at [facility]   Report Status PENDING   Incomplete     Studies: Dg Chest Port 1 View  06/21/2014   CLINICAL DATA:  Altered mental status.  EXAM: PORTABLE CHEST - 1 VIEW  COMPARISON:  Chest radiograph performed 03/05/2014  FINDINGS: The lungs are  well-aerated. Mild right basilar airspace opacity could reflect mild pneumonia. Pulmonary vascularity is at the upper limits of normal. No pleural effusion or pneumothorax is seen.  The cardiomediastinal silhouette is borderline normal in size. Calcification is noted within the aortic arch. No acute osseous abnormalities are seen.  IMPRESSION: Mild right basilar airspace opacity could reflect mild pneumonia.   Electronically Signed   By: Garald Balding M.D.   On: 06/21/2014 06:58   Dg Swallowing Func-speech Pathology  06/22/2014   Orbie Pyo Litaker, CCC-SLP     06/22/2014 11:40 AM Objective Swallowing Evaluation: Modified Barium Swallowing Study   Patient Details  Name: JOSHUAN BOLANDER MRN: [id number] Date of Birth: January 09, 1923  Today's Date: 06/22/2014 Time: 1015-1055 SLP Time Calculation (min): 40 min  Past Medical History:  Past Medical History  Diagnosis Date  . Hypertension   . Hyperlipidemia   . Carotid artery occlusion   . AAA (abdominal aortic aneurysm)   . Valvular heart disease   . Hyperlipidemia   . Cancer     "skin cancer removed from top of head"  . Arthritis     "in his spine"  . Refusal of blood transfusions as patient is Jehovah's Witness  08/31/11  . PTSD (post-traumatic stress disorder)   . Depression     "terrible; from the PTSD"  . Anxiety   . Nightmares   . Spinal stenosis   . Memory loss   . Osteoarthritis     bilateral knees  . Aortic sclerosis     insufficiency mild echo, 10/2007  . Hypercholesteremia   . Renal disease     stage lll  . Dementia, vascular Oct. 2013  . Atrial fibrillation   . Back pain   . Thyrotoxicosis     without mention of goiter or other cause, without mention of  thyrotixic crisis or storm  . Orthostatic hypotension   . Cardiac dysrhythmia   . Kidney disorder   . Ureteral disorder   . Movement disorder   . 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 Transfusion of blood product refused for religious reason   . Neuromuscular disorder     parkinsons  . Macular degeneration    Past Surgical  History:  Past Surgical History  Procedure Laterality Date  . Wrist surgery  2003    left; S/P fracture; "put a plate in it"  . Abdominal aortic aneurysm repair  2002  . Carotid endarterectomy  2004    left  . Total knee arthroplasty  2000's    bilaterally  . Cataract extraction w/ intraocular lens  implant, bilateral  ~  2010  . Joint replacement  2003 and 2004    Right and left knee  . Replacement total knee  12/2006  . Eye surgery    . Fracture surgery Left     wrist   HPI:  80 y.o. with past medical history of dementia, Parkinson, stroke  x3. PTSD, spinal stenosis, renal disease.  Per MD noted pt  discharged from the hospital more than 3 months ago for  community-acquired pneumonia.  MBS4/28/15 revealed trace  aspiration of thin via straw noted - small single boluses not  aspirated. Decreased UES opening due to parkinsons's also  contributed to pharyngeal stasis.  Dys 3 diet and thin liquids  recommended.  CXR Mild right basilar airspace opacity could  reflect mild pneumonia.      Assessment / Plan / Recommendation Clinical Impression  Dysphagia Diagnosis: Mild oral phase dysphagia;Severe pharyngeal  phase dysphagia Clinical impression: Results of MBS are similar to results of MBS  on 01/30/14 (mildy worse).  Significantly decreased tongue base  retraction and laryneal elevation continue to result in maximum  vallecular and pyriform sinus residue with inability to elicit  volitional swallows during study.  Laryngeal penetration observed  with honey, nectar and thin liquids during but mostly after the  swallow from residuals.   Multiple strategies introduced to  decrease residue and aid in airway protection were ineffective  due to pt.'s weakness and inability to initiate dry swallows.   Wife present, educated with discussion of previous and current  MBS and recommendations.  Unfortunately aspiration risk is high  and imminent with all po's.  Wife is aware of risks (as with  previous MBS) and understandably prefers  po's for quality of  life. SLP discussed that pt. will likely continue to have pna.  Wife stated that "the code status is wrong in his chart" and  would like to speak with hospital MD to clarify.  Wife familiar  with Palliative care from daughter who passed away last year.   Would Palliative care be beneficial for Mr. Rattigan?  SP  recommending continue with Dys 2 diet texture and upgrade liquids  to thin (nectar = excessive residue), sit upright, small sips, no  straws, pt. to attempt 2 swallows and volitional coughs, crushed  pills.       Treatment Recommendation  Therapy as outlined in treatment plan below    Diet Recommendation Dysphagia 2 (Fine chop);Thin liquid   Liquid Administration via: Cup;No straw Medication Administration: Crushed with puree Supervision: Full supervision/cueing for compensatory  strategies;Staff to assist with self feeding Compensations: Slow rate;Small sips/bites;Check for  pocketing;Multiple dry swallows after each bite/sip;Hard cough  after swallow Postural Changes and/or Swallow Maneuvers: Seated upright 90  degrees;Upright 30-60 min after meal    Other  Recommendations Oral Care Recommendations: Oral care BID   Follow Up Recommendations   (TBD)    Frequency and Duration min 1 x/week  2 weeks   Pertinent Vitals/Pain No pain           Reason for Referral Objectively evaluate swallowing function   Oral Phase Oral Preparation/Oral Phase Oral Phase: Impaired Oral - Honey Oral - Honey Cup: Delayed oral transit;Weak lingual manipulation Oral - Solids Oral - Puree: Weak lingual manipulation;Delayed oral transit   Pharyngeal Phase Pharyngeal Phase Pharyngeal Phase: Impaired Pharyngeal - Honey Pharyngeal - Honey Teaspoon: Pharyngeal residue -  valleculae;Pharyngeal residue - pyriform sinuses;Reduced tongue  base retraction;Reduced laryngeal elevation (unable to initiate  second swallow) Pharyngeal - Nectar Pharyngeal - Nectar Teaspoon: Pharyngeal residue -  valleculae;Reduced tongue base  retraction;Penetration/Aspiration  during swallow Penetration/Aspiration details (nectar teaspoon): Material enters  airway, remains ABOVE vocal cords and not ejected out Pharyngeal - Nectar Cup: Pharyngeal residue -  valleculae;Pharyngeal residue - pyriform sinuses;Reduced tongue  base retraction;Reduced laryngeal  elevation;Penetration/Aspiration during swallow Penetration/Aspiration details (nectar cup): Material enters  airway, CONTACTS cords and not ejected out;Material enters  airway, passes BELOW cords without attempt by patient to eject  out (silent aspiration) Pharyngeal - Thin Pharyngeal - Thin Teaspoon: Pharyngeal residue -  valleculae;Pharyngeal residue - pyriform sinuses;Reduced  laryngeal elevation;Reduced tongue base  retraction;Penetration/Aspiration during swallow Penetration/Aspiration details (thin teaspoon): Material enters  airway, CONTACTS cords and not ejected out Pharyngeal - Thin Cup: Pharyngeal residue - pyriform  sinuses;Pharyngeal residue - valleculae;Reduced tongue base  retraction;Reduced laryngeal elevation;Penetration/Aspiration  during swallow Penetration/Aspiration details (thin cup): Material enters  airway, passes BELOW cords without attempt by patient to eject  out (silent aspiration);Material enters airway, CONTACTS cords  and not ejected out Pharyngeal - Solids Pharyngeal - Puree: Pharyngeal residue - valleculae;Reduced  tongue base retraction  Cervical Esophageal Phase    GO    Cervical Esophageal Phase Cervical Esophageal Phase: Darryll Capers         Houston Siren 06/22/2014, 11:39 AM  Orbie Pyo Colvin Caroli.Ed CCC-SLP Pager [phone]     Scheduled Meds: . carbidopa-levodopa  1 tablet Oral TID  . donepezil  5 mg Oral Daily  . heparin  5,000 Units Subcutaneous 3 times per day  .  miconazole   Topical BID  . piperacillin-tazobactam (ZOSYN)  IV  3.375 g Intravenous 3 times per day  . potassium chloride  20 mEq Oral BID   Continuous Infusions: . sodium chloride       Principal Problem:   Aspiration pneumonia Active Problems:   Dementia with behavioral disturbance   Acute respiratory failure with hypoxia   Acute encephalopathy   Leukocytosis   Protein-calorie malnutrition, severe    Time spent: [place], [place] [facility]  [facility] Pager [phone]. If 7PM-7AM, please contact night-coverage at [email or web address], password [facility] 06/22/2014, 2:26 PM  LOS: 1 day     Attending  Patient was seen, examined,treatment plan was discussed with the Physician extender. I have directly reviewed the clinical findings, lab, imaging studies and management of this patient in detail. I have made the necessary changes to the above noted documentation, and agree with the documentation, as recorded by the Physician extender.  Phillips Climes MD  [facility].

## 2014-06-23 LAB — BASIC METABOLIC PANEL
Anion gap: 10 (ref 5–15)
BUN: 12 mg/dL (ref 6–23)
CO2: 23 mEq/L (ref 19–32)
CREATININE: 0.88 mg/dL (ref 0.50–1.35)
Calcium: 9.6 mg/dL (ref 8.4–10.5)
Chloride: 114 mEq/L — ABNORMAL HIGH (ref 96–112)
GFR calc Af Amer: 86 mL/min — ABNORMAL LOW (ref >90)
GFR calc non Af Amer: 74 mL/min — ABNORMAL LOW (ref >90)
GLUCOSE: 90 mg/dL (ref 70–99)
POTASSIUM: 4 meq/L (ref 3.7–5.3)
Sodium: 147 mEq/L (ref 137–147)

## 2014-06-23 LAB — URINE CULTURE
COLONY COUNT: NO GROWTH
CULTURE: NO GROWTH

## 2014-06-23 LAB — CBC
HEMATOCRIT: 38.1 % — AB (ref 39.0–52.0)
HEMOGLOBIN: 12.2 g/dL — AB (ref 13.0–17.0)
MCH: 28.8 pg (ref 26.0–34.0)
MCHC: 32 g/dL (ref 30.0–36.0)
MCV: 89.9 fL (ref 78.0–100.0)
Platelets: 183 10*3/uL (ref 150–400)
RBC: 4.24 MIL/uL (ref 4.22–5.81)
RDW: 14.6 % (ref 11.5–15.5)
WBC: 7.9 10*3/uL (ref 4.0–10.5)

## 2014-06-23 MED ORDER — SODIUM CHLORIDE 0.45 % IV SOLN
INTRAVENOUS | Status: AC
  Administered 2014-06-23 (×2): via INTRAVENOUS

## 2014-06-23 MED ORDER — SODIUM CHLORIDE 0.9 % IV SOLN: INTRAVENOUS | Status: DC

## 2014-06-23 NOTE — Progress Notes (Signed)
[facility] PROGRESS NOTE  ARSENIY [name] EZM:[id number] DOB: 14-Jun-1923 DOA: 06/21/2014 PCP: Mathews Argyle, MD      HPI/  [name] [name] is a 80 y.o. male with PMH of dementia, Parkinson, recent admission (discharge from [facility] 03/08/14) for pneumonia who presented via EMS with weakness and rapid breathing. Patient accopamied by wife and daughter that aid with history.  He denied any fever, chills, nausea, vomiting, or diarrhea. During previous hospitalization, dysphagia 1 diet was recommended after swallowing evaluation. Family relates he has been slowly declining and that he's had episodes of coughing with eating. Patient is admitted and found to have aspiration pneumonia. He is treated with IV antibiotics.    Assessment/Plan:  Acute respiratory failure with hypoxia  -Likely due to aspiration pneumonia -continue to monitor and O2 when necessary.  Pneumonia -afebrile, no leukocytosis- improvement in respiratory status,  -Swallowing study- dysphagia 2, think liquid diet -Initially placed on IV Rocephin and Azithromycin, which was then switched to Unasyn and switched to Zosyn. -Blood cultures showing no growth to date -PT recommendation of SNF but wife insists on taking him home  Hypetension -Family states patient currently only on Norvasc 2.5 and recently taken off of other BP meds because of hypotension - PRN hydralazine with holding paramaters.  Hypokalemia -Replaced, monitor  Acute encephalopathy Dementia with behavioral disturbance  -Confusion is probably due to pneumonia.  -UA negative, urine cultures pending -Continue Aricept - Seroquel when necessary and Haldol for agitation  Groin Rash -small confined area in penile region with erythema. No wound, discharge, or satellite lesions -nystatin cream TID  Protein-calorie malnutrition, severe  -  Ensure 3 times   DVT Prophylaxis:   Code Status: Patient is DO NOT RESUSCITATE. This was discussed at length with  the wife at bedside, son at bedside. Family Communication: Wife and son at bedside Disposition Plan: Inpatient, SNF when stable- Family likely doesn't desire SNF and would rather take him home with home health PT   Consultants:  None  Procedures:  None  Antibiotics: Rocephin and azithromycin Unasyn Zosyn (day 1)  Subjective: Patient had an episode of agitationovernight  , requiring when necessary IV Haldol, currently is sleeping comfortably.   Objective: No complaints   Filed Vitals:   06/23/14 1508  BP: 159/74  Pulse: 71  Temp: 99.3 F (37.4 C)  Resp: 22    Intake/Output Summary (Last 24 hours) at 06/23/14 1626 Last data filed at 06/22/14 1800  Gross per 24 hour  Intake    950 ml  Output      0 ml  Net    950 ml   Filed Weights   06/21/14 1132  Weight: 62.37 kg (137 lb 8 oz)    Exam:  Gen: Awake and Alert Caucasian male in NAD. 2L [place] O2 sat 98% HEENT: Normocephalic, atraumatic.  Pupils symmertrical.  Moist mucosa.   Chest: clear to auscultate bilaterally, no ronchi or rales  Cardiac: Regular rate and rhythm, S1-S2, no rubs murmurs or gallops  Abdomen: soft, non tender, non distended, +bowel sounds. No guarding or rigidity  Genitourinary:  Penis- small confined area of erythema.  No swelling, pain, discharge, or satellite lesions Extremities: Symmetrical in appearance without cyanosis or edema  Neurological: sleepy but arousable, follows commands Psychiatric: Appears normal. Unable to assess- sleeping.  Data Reviewed: Basic Metabolic Panel:  Recent Labs Lab 06/21/14 0630 06/22/14 0638 06/23/14 0615  NA 144 143 147  K 3.8 3.6* 4.0  CL 104 105 114*  CO2 24 27  23  GLUCOSE 164* 96 90  BUN 21 16 12   CREATININE 0.99 0.78 0.88  CALCIUM 9.7 9.3 9.6   Liver Function Tests:  Recent Labs Lab 06/21/14 0630 06/22/14 0638  AST 18 17  ALT <5 <5  ALKPHOS 87 71  BILITOT 0.3 0.3  PROT 6.9 5.9*  ALBUMIN 3.4* 2.9*   No results found for this  basename: LIPASE, AMYLASE,  in the last 168 hours No results found for this basename: AMMONIA,  in the last 168 hours CBC:  Recent Labs Lab 06/21/14 0630 06/23/14 0615  WBC 10.9* 7.9  NEUTROABS 9.5*  --   HGB 13.9 12.2*  HCT 43.4 38.1*  MCV 90.0 89.9  PLT 139* 183   Cardiac Enzymes: No results found for this basename: CKTOTAL, CKMB, CKMBINDEX, TROPONINI,  in the last 168 hours BNP (last 3 results)  Recent Labs  03/02/14 0503  PROBNP 1416.0*   CBG:  Recent Labs Lab 06/21/14 0643  GLUCAP 144*    Recent Results (from the past 240 hour(s))  CULTURE, BLOOD (ROUTINE X 2)     Status: None   Collection Time    06/21/14  6:30 AM      Result Value Ref Range Status   Specimen Description BLOOD FOREARM RIGHT   Final   Special Requests     Final   Value: BOTTLES DRAWN AEROBIC AND ANAEROBIC BLUE 3ML RED 4ML   Culture  Setup Time     Final   Value: 06/21/2014 13:12     Performed at [facility]   Culture     Final   Value:        BLOOD CULTURE RECEIVED NO GROWTH TO DATE CULTURE WILL BE HELD FOR 5 DAYS BEFORE ISSUING A FINAL NEGATIVE REPORT     Performed at [facility]   Report Status PENDING   Incomplete  CULTURE, BLOOD (ROUTINE X 2)     Status: None   Collection Time    06/21/14  8:20 AM      Result Value Ref Range Status   Specimen Description BLOOD RIGHT ANTECUBITAL   Final   Special Requests BOTTLES DRAWN AEROBIC AND ANAEROBIC 5ML   Final   Culture  Setup Time     Final   Value: 06/21/2014 13:12     Performed at [facility]   Culture     Final   Value:        BLOOD CULTURE RECEIVED NO GROWTH TO DATE CULTURE WILL BE HELD FOR 5 DAYS BEFORE ISSUING A FINAL NEGATIVE REPORT     Performed at [facility]   Report Status PENDING   Incomplete  URINE CULTURE     Status: None   Collection Time    06/21/14 11:11 AM      Result Value Ref Range Status   Specimen Description URINE, CATHETERIZED   Final   Special Requests NONE   Final    Culture  Setup Time     Final   Value: 06/21/2014 17:18     Performed at [place]     Final   Value: NO GROWTH     Performed at [facility]   Culture     Final   Value: NO GROWTH     Performed at [facility]   Report Status 06/23/2014 FINAL   Final     Studies: Dg Swallowing Func-speech Pathology  06/22/2014   Houston Siren,  CCC-SLP     06/22/2014 11:40 AM Objective Swallowing Evaluation: Modified Barium Swallowing Study   Patient Details  Name: TYREASE VANDEBERG MRN: [id number] Date of Birth: 03/15/1923  Today's Date: 06/22/2014 Time: 1015-1055 SLP Time Calculation (min): 40 min  Past Medical History:  Past Medical History  Diagnosis Date  . Hypertension   . Hyperlipidemia   . Carotid artery occlusion   . AAA (abdominal aortic aneurysm)   . Valvular heart disease   . Hyperlipidemia   . Cancer     "skin cancer removed from top of head"  . Arthritis     "in his spine"  . Refusal of blood transfusions as patient is Jehovah's Witness  08/31/11  . PTSD (post-traumatic stress disorder)   . Depression     "terrible; from the PTSD"  . Anxiety   . Nightmares   . Spinal stenosis   . Memory loss   . Osteoarthritis     bilateral knees  . Aortic sclerosis     insufficiency mild echo, 10/2007  . Hypercholesteremia   . Renal disease     stage lll  . Dementia, vascular Oct. 2013  . Atrial fibrillation   . Back pain   . Thyrotoxicosis     without mention of goiter or other cause, without mention of  thyrotixic crisis or storm  . Orthostatic hypotension   . Cardiac dysrhythmia   . Kidney disorder   . Ureteral disorder   . Movement disorder   . 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 Transfusion of blood product refused for religious reason   . Neuromuscular disorder     parkinsons  . Macular degeneration    Past Surgical History:  Past Surgical History  Procedure Laterality Date  . Wrist surgery  2003    left; S/P fracture; "put a plate in it"  . Abdominal aortic aneurysm repair  2002  .  Carotid endarterectomy  2004    left  . Total knee arthroplasty  2000's    bilaterally  . Cataract extraction w/ intraocular lens  implant, bilateral  ~  2010  . Joint replacement  2003 and 2004    Right and left knee  . Replacement total knee  12/2006  . Eye surgery    . Fracture surgery Left     wrist   HPI:  80 y.o. with past medical history of dementia, Parkinson, stroke  x3. PTSD, spinal stenosis, renal disease.  Per MD noted pt  discharged from the hospital more than 3 months ago for  community-acquired pneumonia.  MBS4/28/15 revealed trace  aspiration of thin via straw noted - small single boluses not  aspirated. Decreased UES opening due to parkinsons's also  contributed to pharyngeal stasis.  Dys 3 diet and thin liquids  recommended.  CXR Mild right basilar airspace opacity could  reflect mild pneumonia.      Assessment / Plan / Recommendation Clinical Impression  Dysphagia Diagnosis: Mild oral phase dysphagia;Severe pharyngeal  phase dysphagia Clinical impression: Results of MBS are similar to results of MBS  on 01/30/14 (mildy worse).  Significantly decreased tongue base  retraction and laryneal elevation continue to result in maximum  vallecular and pyriform sinus residue with inability to elicit  volitional swallows during study.  Laryngeal penetration observed  with honey, nectar and thin liquids during but mostly after the  swallow from residuals.   Multiple strategies introduced to  decrease residue and aid in airway protection were ineffective  due to  pt.'s weakness and inability to initiate dry swallows.   Wife present, educated with discussion of previous and current  MBS and recommendations.  Unfortunately aspiration risk is high  and imminent with all po's.  Wife is aware of risks (as with  previous MBS) and understandably prefers po's for quality of  life. SLP discussed that pt. will likely continue to have pna.  Wife stated that "the code status is wrong in his chart" and  would like to speak  with hospital MD to clarify.  Wife familiar  with Palliative care from daughter who passed away last year.   Would Palliative care be beneficial for Mr. Umholtz?  SP  recommending continue with Dys 2 diet texture and upgrade liquids  to thin (nectar = excessive residue), sit upright, small sips, no  straws, pt. to attempt 2 swallows and volitional coughs, crushed  pills.       Treatment Recommendation  Therapy as outlined in treatment plan below    Diet Recommendation Dysphagia 2 (Fine chop);Thin liquid   Liquid Administration via: Cup;No straw Medication Administration: Crushed with puree Supervision: Full supervision/cueing for compensatory  strategies;Staff to assist with self feeding Compensations: Slow rate;Small sips/bites;Check for  pocketing;Multiple dry swallows after each bite/sip;Hard cough  after swallow Postural Changes and/or Swallow Maneuvers: Seated upright 90  degrees;Upright 30-60 min after meal    Other  Recommendations Oral Care Recommendations: Oral care BID   Follow Up Recommendations   (TBD)    Frequency and Duration min 1 x/week  2 weeks   Pertinent Vitals/Pain No pain           Reason for Referral Objectively evaluate swallowing function   Oral Phase Oral Preparation/Oral Phase Oral Phase: Impaired Oral - Honey Oral - Honey Cup: Delayed oral transit;Weak lingual manipulation Oral - Solids Oral - Puree: Weak lingual manipulation;Delayed oral transit   Pharyngeal Phase Pharyngeal Phase Pharyngeal Phase: Impaired Pharyngeal - Honey Pharyngeal - Honey Teaspoon: Pharyngeal residue -  valleculae;Pharyngeal residue - pyriform sinuses;Reduced tongue  base retraction;Reduced laryngeal elevation (unable to initiate  second swallow) Pharyngeal - Nectar Pharyngeal - Nectar Teaspoon: Pharyngeal residue -  valleculae;Reduced tongue base retraction;Penetration/Aspiration  during swallow Penetration/Aspiration details (nectar teaspoon): Material enters  airway, remains ABOVE vocal cords and not ejected out  Pharyngeal - Nectar Cup: Pharyngeal residue -  valleculae;Pharyngeal residue - pyriform sinuses;Reduced tongue  base retraction;Reduced laryngeal  elevation;Penetration/Aspiration during swallow Penetration/Aspiration details (nectar cup): Material enters  airway, CONTACTS cords and not ejected out;Material enters  airway, passes BELOW cords without attempt by patient to eject  out (silent aspiration) Pharyngeal - Thin Pharyngeal - Thin Teaspoon: Pharyngeal residue -  valleculae;Pharyngeal residue - pyriform sinuses;Reduced  laryngeal elevation;Reduced tongue base  retraction;Penetration/Aspiration during swallow Penetration/Aspiration details (thin teaspoon): Material enters  airway, CONTACTS cords and not ejected out Pharyngeal - Thin Cup: Pharyngeal residue - pyriform  sinuses;Pharyngeal residue - valleculae;Reduced tongue base  retraction;Reduced laryngeal elevation;Penetration/Aspiration  during swallow Penetration/Aspiration details (thin cup): Material enters  airway, passes BELOW cords without attempt by patient to eject  out (silent aspiration);Material enters airway, CONTACTS cords  and not ejected out Pharyngeal - Solids Pharyngeal - Puree: Pharyngeal residue - valleculae;Reduced  tongue base retraction  Cervical Esophageal Phase    GO    Cervical Esophageal Phase Cervical Esophageal Phase: Darryll Capers         Houston Siren 06/22/2014, 11:39 AM  Orbie Pyo Colvin Caroli.Ed CCC-SLP Pager [phone]     Scheduled Meds: . carbidopa-levodopa  1 tablet Oral  TID  . donepezil  5 mg Oral Daily  . feeding supplement (ENSURE)  1 Container Oral TID BM  . heparin  5,000 Units Subcutaneous 3 times per day  . nystatin cream   Topical TID  . piperacillin-tazobactam (ZOSYN)  IV  3.375 g Intravenous 3 times per day   Continuous Infusions: . sodium chloride 75 mL/hr at 06/23/14 1446    Principal Problem:   Aspiration pneumonia Active Problems:   Dementia with behavioral disturbance   Acute respiratory failure  with hypoxia   Acute encephalopathy   Leukocytosis   Protein-calorie malnutrition, severe   Groin rash    Time spent: 30 minutes    Varina Hulon MD [facility] Pager [phone]. If 7PM-7AM, please contact night-coverage at [email or web address], password [facility] 06/23/2014, 4:26 PM  LOS: 2 days

## 2014-06-24 MED ORDER — QUETIAPINE 12.5 MG HALF TABLET
12.5000 mg | ORAL_TABLET | Freq: Every evening | ORAL | Status: DC | PRN
  Filled 2014-06-24: qty 1

## 2014-06-24 MED ORDER — HALOPERIDOL LACTATE 5 MG/ML IJ SOLN
0.5000 mg | Freq: Four times a day (QID) | INTRAMUSCULAR | Status: DC | PRN
  Administered 2014-06-24: 0.5 mg via INTRAVENOUS
  Filled 2014-06-24: qty 1

## 2014-06-24 NOTE — Progress Notes (Signed)
[facility] PROGRESS NOTE  Sorrels OHMS LEX:[id number] DOB: [birth year] DOA: 06/21/2014 PCP: Mathews Argyle, MD      HPI/  [name] [name] is a 80 y.o. male with PMH of dementia, Parkinson, recent admission (discharge from [facility] 03/08/14) for pneumonia who presented via EMS with weakness and rapid breathing. Patient accopamied by wife and daughter that aid with history.  He denied any fever, chills, nausea, vomiting, or diarrhea. During previous hospitalization, dysphagia 1 diet was recommended after swallowing evaluation. Family relates he has been slowly declining and that he's had episodes of coughing with eating. Patient is admitted and found to have aspiration pneumonia. He is treated with IV antibiotics.    Assessment/Plan:  Acute respiratory failure with hypoxia  -Likely due to aspiration pneumonia -continue to monitor and O2 when necessary.  Pneumonia -afebrile, no leukocytosis- in no respiratory distress. -Swallowing study- dysphagia 2, think liquid diet -on Zosyn day #2.  -Blood cultures showing no growth to date -PT recommendation of SNF but wife insists on taking him home  Hypetension -Family states patient currently only on Norvasc 2.5 and recently taken off of other BP meds because of hypotension - PRN hydralazine with holding paramaters.  Hypokalemia -Replaced, monitor  Acute encephalopathy Dementia with behavioral disturbance  -Confusion is probably due to pneumonia.  -UA negative, urine cultures pending -Continue Aricept - Seroquel when necessary and Haldol for agitation  Groin Rash -small confined area in penile region with erythema. No wound, discharge, or satellite lesions -nystatin cream TID  Protein-calorie malnutrition, severe  -  Ensure 3 times   DVT Prophylaxis:   Code Status: Patient is DO NOT RESUSCITATE. This was discussed at length with the wife at bedside, son at bedside. Family Communication: Wife  at bedside Disposition Plan:  Inpatient, SNF when stable- Family likely doesn't desire SNF and would rather take him home with home health PT   Consultants:  None  Procedures:  None  Antibiotics: Rocephin and azithromycin Unasyn Zosyn (day 1)  Subjective: Patient had a good night sleep,but had one episode of agitation requiring halodol.   Objective: No complaints   Filed Vitals:   06/24/14 0555  BP: 182/42  Pulse: 55  Temp: 97.9 F (36.6 C)  Resp: 18    Intake/Output Summary (Last 24 hours) at 06/24/14 1325 Last data filed at 06/24/14 0900  Gross per 24 hour  Intake 1876.25 ml  Output      0 ml  Net 1876.25 ml   Filed Weights   06/21/14 1132  Weight: 62.37 kg (137 lb 8 oz)    Exam:  Gen:  HEENT: Normocephalic, atraumatic.  Pupils symmertrical.  Moist mucosa.   Chest: clear to auscultate bilaterally, no ronchi or rales  Cardiac: Regular rate and rhythm, S1-S2, no rubs murmurs or gallops  Abdomen: soft, non tender, non distended, +bowel sounds. No guarding or rigidity  Genitourinary:  Penis- small confined area of erythema.  No swelling, pain, discharge, or satellite lesions Extremities: Symmetrical in appearance without cyanosis or edema  Neurological: sleepy but arousable, follows commands Psychiatric: Appears normal. Unable to assess- sleeping.  Data Reviewed: Basic Metabolic Panel:  Recent Labs Lab 06/21/14 0630 06/22/14 0638 06/23/14 0615  NA 144 143 147  K 3.8 3.6* 4.0  CL 104 105 114*  CO2 24 27 23   GLUCOSE 164* 96 90  BUN 21 16 12   CREATININE 0.99 0.78 0.88  CALCIUM 9.7 9.3 9.6   Liver Function Tests:  Recent Labs Lab 06/21/14 0630 06/22/14 4496  AST 18 17  ALT <5 <5  ALKPHOS 87 71  BILITOT 0.3 0.3  PROT 6.9 5.9*  ALBUMIN 3.4* 2.9*   No results found for this basename: LIPASE, AMYLASE,  in the last 168 hours No results found for this basename: AMMONIA,  in the last 168 hours CBC:  Recent Labs Lab 06/21/14 0630 06/23/14 0615  WBC 10.9* 7.9   NEUTROABS 9.5*  --   HGB 13.9 12.2*  HCT 43.4 38.1*  MCV 90.0 89.9  PLT 139* 183   Cardiac Enzymes: No results found for this basename: CKTOTAL, CKMB, CKMBINDEX, TROPONINI,  in the last 168 hours BNP (last 3 results)  Recent Labs  03/02/14 0503  PROBNP 1416.0*   CBG:  Recent Labs Lab 06/21/14 0643  GLUCAP 144*    Recent Results (from the past 240 hour(s))  CULTURE, BLOOD (ROUTINE X 2)     Status: None   Collection Time    06/21/14  6:30 AM      Result Value Ref Range Status   Specimen Description BLOOD FOREARM RIGHT   Final   Special Requests     Final   Value: BOTTLES DRAWN AEROBIC AND ANAEROBIC BLUE 3ML RED 4ML   Culture  Setup Time     Final   Value: 06/21/2014 13:12     Performed at [facility]   Culture     Final   Value:        BLOOD CULTURE RECEIVED NO GROWTH TO DATE CULTURE WILL BE HELD FOR 5 DAYS BEFORE ISSUING A FINAL NEGATIVE REPORT     Performed at [facility]   Report Status PENDING   Incomplete  CULTURE, BLOOD (ROUTINE X 2)     Status: None   Collection Time    06/21/14  8:20 AM      Result Value Ref Range Status   Specimen Description BLOOD RIGHT ANTECUBITAL   Final   Special Requests BOTTLES DRAWN AEROBIC AND ANAEROBIC 5ML   Final   Culture  Setup Time     Final   Value: 06/21/2014 13:12     Performed at [facility]   Culture     Final   Value:        BLOOD CULTURE RECEIVED NO GROWTH TO DATE CULTURE WILL BE HELD FOR 5 DAYS BEFORE ISSUING A FINAL NEGATIVE REPORT     Performed at [facility]   Report Status PENDING   Incomplete  URINE CULTURE     Status: None   Collection Time    06/21/14 11:11 AM      Result Value Ref Range Status   Specimen Description URINE, CATHETERIZED   Final   Special Requests NONE   Final   Culture  Setup Time     Final   Value: 06/21/2014 17:18     Performed at [place]     Final   Value: NO GROWTH     Performed at [facility]   Culture      Final   Value: NO GROWTH     Performed at [facility]   Report Status 06/23/2014 FINAL   Final     Studies: No results found.  Scheduled Meds: . carbidopa-levodopa  1 tablet Oral TID  . donepezil  5 mg Oral Daily  . feeding supplement (ENSURE)  1 Container Oral TID BM  . heparin  5,000 Units Subcutaneous 3 times per day  . nystatin  cream   Topical TID  . piperacillin-tazobactam (ZOSYN)  IV  3.375 g Intravenous 3 times per day   Continuous Infusions:    Principal Problem:   Aspiration pneumonia Active Problems:   Dementia with behavioral disturbance   Acute respiratory failure with hypoxia   Acute encephalopathy   Leukocytosis   Protein-calorie malnutrition, severe   Groin rash    Time spent: 30 minutes    Valora Norell MD [facility] Pager [phone]. If 7PM-7AM, please contact night-coverage at [email or web address], password [facility] 06/24/2014, 1:25 PM  LOS: 3 days

## 2014-06-25 LAB — BASIC METABOLIC PANEL
Anion gap: 11 (ref 5–15)
BUN: 13 mg/dL (ref 6–23)
CALCIUM: 9.4 mg/dL (ref 8.4–10.5)
CO2: 26 meq/L (ref 19–32)
CREATININE: 1.04 mg/dL (ref 0.50–1.35)
Chloride: 109 mEq/L (ref 96–112)
GFR calc Af Amer: 71 mL/min — ABNORMAL LOW (ref >90)
GFR, EST NON AFRICAN AMERICAN: 62 mL/min — AB (ref >90)
GLUCOSE: 93 mg/dL (ref 70–99)
Potassium: 3.3 mEq/L — ABNORMAL LOW (ref 3.7–5.3)
Sodium: 146 mEq/L (ref 137–147)

## 2014-06-25 LAB — CBC
HCT: 39.7 % (ref 39.0–52.0)
Hemoglobin: 12.8 g/dL — ABNORMAL LOW (ref 13.0–17.0)
MCH: 28.1 pg (ref 26.0–34.0)
MCHC: 32.2 g/dL (ref 30.0–36.0)
MCV: 87.3 fL (ref 78.0–100.0)
Platelets: 179 10*3/uL (ref 150–400)
RBC: 4.55 MIL/uL (ref 4.22–5.81)
RDW: 14.5 % (ref 11.5–15.5)
WBC: 7 10*3/uL (ref 4.0–10.5)

## 2014-06-25 MED ORDER — AMLODIPINE BESYLATE 2.5 MG PO TABS
2.5000 mg | ORAL_TABLET | Freq: Every day | ORAL | Status: DC
  Administered 2014-06-25 – 2014-06-26 (×2): 2.5 mg via ORAL
  Filled 2014-06-25 (×2): qty 1

## 2014-06-25 MED ORDER — HALOPERIDOL LACTATE 5 MG/ML IJ SOLN: 0.5000 mg | Freq: Once | INTRAMUSCULAR | Status: AC | PRN

## 2014-06-25 MED ORDER — HYDRALAZINE HCL 20 MG/ML IJ SOLN
10.0000 mg | Freq: Four times a day (QID) | INTRAMUSCULAR | Status: DC | PRN
  Filled 2014-06-25: qty 1

## 2014-06-25 MED ORDER — HYDRALAZINE HCL 20 MG/ML IJ SOLN: 10.0000 mg | Freq: Four times a day (QID) | INTRAMUSCULAR | Status: DC | PRN

## 2014-06-25 MED ORDER — POTASSIUM CHLORIDE CRYS ER 20 MEQ PO TBCR
40.0000 meq | EXTENDED_RELEASE_TABLET | Freq: Once | ORAL | Status: AC
  Administered 2014-06-25: 40 meq via ORAL
  Filled 2014-06-25: qty 2

## 2014-06-25 NOTE — Progress Notes (Signed)
[place], PA made aware of patient's systolic BP >333. New orders received.

## 2014-06-25 NOTE — Progress Notes (Signed)
CARE MANAGEMENT NOTE 06/25/2014  Patient:  [name] [name]   Account Number:  [id number]  Date Initiated:  06/25/2014  Documentation initiated by:  [facility]  Subjective/Objective Assessment:   CAP     Action/Plan:   lives at home with wife   Anticipated DC Date:  06/26/2014   Anticipated DC Plan:  [place]  CM consult      [facility] Choice  HOME HEALTH   Choice offered to / List presented to:  C-3 Spouse        HH arranged  [facility] - 11 Patient Refused      Status of service:  Completed, signed off Medicare Important Message given?  YES (If response is "NO", the following Medicare IM given date fields will be blank) Date Medicare IM given:  06/25/2014 Medicare IM given by:  [name] [name] Date Additional Medicare IM given:   Additional Medicare IM given by:    Discharge Disposition:  HOME/SELF CARE  Per UR Regulation:    If discussed at Long Length of Stay Meetings, dates discussed:    Comments:  06/25/2014 1500 NCM spoke to pt and offered choice for [facility]. Wife states she has a friend that is a Tax inspector that comes every morning to check on pt. She also has a friend that is Physical Therapist that comes once per week to assist with pt. States at this time she does not want HH. Wife reports pt has RW, wheelchair, and stair lift at home. Requesting 3n1 for home. Notified [facility] for 3n1 for home. [name] Finner RN  CCM Case Mgmt phone [phone]

## 2014-06-25 NOTE — Progress Notes (Signed)
Physical Therapy Treatment Patient Details Name: [name] [name] MRN: [id number] DOB: [birth year] Today's Date: 06/25/2014    History of Present Illness Patient is a 80 y/o male with PMH of dementia and PD's discharged from the hospital more than 3 months ago for community-acquired pneumonia presents with slow decline over the last couple weeks where he's had episodes of coughing with eating. Per family, pt couldn't get out of bed the day of admission and was breathing more rapidly so wife called EMS. Chest xray-right lower lobe infiltrate with leukocytosis.    PT Comments    Pt in bed with sleepy presentation and once awake could be up in chair.  Back to  Bed right after PT left at pt request but did get practice of transfers.  Family planning for SNF trip after hospital which is very appropriate.  Follow Up Recommendations  SNF;Supervision/Assistance - 24 hour     Equipment Recommendations       Recommendations for Other Services       Precautions / Restrictions Precautions Precautions: Fall Restrictions Weight Bearing Restrictions: No    Mobility  Bed Mobility Overal bed mobility: Needs Assistance Bed Mobility: Supine to Sit     Supine to sit: Max assist     General bed mobility comments: Sleepy due  to poor rest but then was able to move with PT initiating and providing most of theeffort  Transfers Overall transfer level: Needs assistance Equipment used: 1 person hand held assist Transfers: Sit to/from [facility] Sit to Stand: Max assist Stand pivot transfers: Max assist       General transfer comment: Not fully standing but did have ability to assist standing.  Leans with PT on command to stand but just worn out somewhat from poor sleep  Ambulation/Gait                 Stairs            Wheelchair Mobility    Modified Rankin (Stroke Patients Only)       Balance Overall balance assessment: Needs assistance Sitting-balance  support: Bilateral upper extremity supported;Feet supported Sitting balance-Leahy Scale: Poor Sitting balance - Comments: Requires CGAto min assist at times but then can sit withsupervision at times. Postural control: Posterior lean Standing balance support: Bilateral upper extremity supported Standing balance-Leahy Scale: Zero Standing balance comment: Pt cannot fully stand today but does support with his legs to assist the effort                    Cognition Arousal/Alertness: Lethargic Behavior During Therapy: Flat affect;Impulsive Overall Cognitive Status: Impaired/Different from baseline Area of Impairment: Orientation;Following commands;Safety/judgement;Problem solving Orientation Level: Disoriented to;Situation;Place;Time   Memory: Decreased short-term memory Following Commands: Follows one step commands with increased time;Follows one step commands inconsistently Safety/Judgement: Decreased awareness of safety Awareness: Intellectual Problem Solving: Decreased initiation;Requires verbal cues;Requires tactile cues;Difficulty sequencing;Slow processing General Comments: Daughter reports he is sleepy because he was up all  night, asking for food and then slept very little after that    Exercises      General Comments        Pertinent Vitals/Pain Pain Assessment: No/denies pain BP 173/69, pulse 62 and O2 sat 100% per nsg notes.    Home Living                      Prior Function            PT Goals (current goals  can now be found in the care plan section) Acute Rehab PT Goals Patient Stated Goal: none stated Progress towards PT goals: Progressing toward goals    Frequency  Min 3X/week    PT Plan Current plan remains appropriate    Co-evaluation             End of Session Equipment Utilized During Treatment: Gait belt Activity Tolerance: Patient limited by fatigue;Patient limited by lethargy Patient left: in chair;with call bell/phone  within reach;with family/visitor present     Time: 3005-1102 PT Time Calculation (min): 24 min  Charges:  $Therapeutic Activity: 23-37 mins                    G Codes:      Ramond Dial 06/28/2014, 9:12 AM  Mee Hives, PT MS Acute Rehab Dept. Number: [phone]

## 2014-06-25 NOTE — Clinical Social Work Note (Signed)
Per treatment team, family is refusing SNF placement for patient and plans for return home. CSW signing off at this time.   Liz Beach MSW, [place], [place], [phone]

## 2014-06-25 NOTE — Progress Notes (Addendum)
PROGRESS NOTE  [name] [name]:[id number] DOB: 02-Jun-1923 DOA: 06/21/2014 PCP: Mathews Argyle, MD  HPI/Subjective: [name] [name] is a 80 y.o. Male with history of dementia, parkinson's, and recent admission for pneumonia (discharge 03/08/14) who presented with weakness and rapid breathing. Dysphagia 1 diet had been recommended during last hospitalization after a swallowing evaluation. Per the patient's wife and daughter, the patient has been slowly declining and has frequent episodes of coughing when he eats. Chest X-ray revealed probably aspiration pneumonia.   Today he has had a coughing spell after eating pudding, per his daughter, who described the cough as sounding more like clearing his throat than a "deeper" cough. He has been sleeping many hours throughout the day, but daughter reported he did not fall asleep until 5 am this morning.    Assessment/Plan:  Acute respiratory failure with hypoxia  -Right basilar airspace opacity on CXR -Likely secondary to aspiration pneumonia  -Continue to monitor and O2 prn   Pneumonia  -afebrile, no leukocytosis- in no respiratory distress.  -on Zosyn day #3.  -Swallowing study completed- dysphagia 2, liquid diet  -Blood cultures showing no growth to date  -PT recommendation of SNF but wife insists on taking him home   Hypetension  -Family states patient currently only on Norvasc 2.5 mg IF his BP rises above 175.  Even 5 mg of norvasc caused hypotension rapidly. - PRN hydralazine with holding paramaters.  - Allow permissive hypertension up to 180s.  Hypokalemia  -Replaced, monitor   Acute encephalopathy Dementia with behavioral disturbance  -Increased confusion  probably secondary to pneumonia.  -UA negative, urine cultures show no growth at this time   -Continue Aricept  - Seroquel when necessary and Haldol for agitation  Advanced Parkinsons -progressive per [name] [name] -Continue Sinemet -PT recommends SNF.  Wife politely  refuses.   Groin Rash  -small confined area in penile region with erythema. No wound, discharge, or satellite lesions  -nystatin cream TID   Protein-calorie malnutrition, severe  - Ensure 3 times dailt    DVT Prophylaxis:  Heparin  Code Status: DNR Family Communication:Daughter in room.  Discussed at length with wife.  [name] [name] seems to understand that he will have recurrent aspiration PNA and wants to take [name] [name] home rather than opt for Skilled Rehab. Disposition Plan: Discharge home soon.  Consultants:  None  Procedures:  None  Antibiotics: Anti-infectives   Start     Dose/Rate Route Frequency Ordered Stop   06/22/14 0700  piperacillin-tazobactam (ZOSYN) IVPB 3.375 g     3.375 g 12.5 mL/hr over 240 Minutes Intravenous 3 times per day 06/22/14 0656     06/21/14 1400  ampicillin-sulbactam (UNASYN) 1.5 g in sodium chloride 0.9 % 50 mL IVPB  Status:  Discontinued     1.5 g 100 mL/hr over 30 Minutes Intravenous Every 8 hours 06/21/14 1131 06/22/14 0656   06/21/14 0815  cefTRIAXone (ROCEPHIN) 1 g in dextrose 5 % 50 mL IVPB     1 g 100 mL/hr over 30 Minutes Intravenous  Once 06/21/14 0806 06/21/14 0850   06/21/14 0815  azithromycin (ZITHROMAX) 500 mg in dextrose 5 % 250 mL IVPB     500 mg 250 mL/hr over 60 Minutes Intravenous  Once 06/21/14 0806 06/21/14 0921      Objective: Filed Vitals:   06/25/14 0552 06/25/14 0926 06/25/14 1007 06/25/14 1203  BP: 173/69 197/68 145/48 182/50  Pulse: 62 60    Temp: 98.1 F (36.7 C)  TempSrc: Axillary     Resp: 20     Height:      Weight:      SpO2: 100%       Intake/Output Summary (Last 24 hours) at 06/25/14 1320 Last data filed at 06/25/14 1194  Gross per 24 hour  Intake    440 ml  Output      0 ml  Net    440 ml   Filed Weights   06/21/14 1132  Weight: 62.37 kg (137 lb 8 oz)    Exam: General: Well developed, in NAD, sleeping soundly when I entered the room, appears stated age   Cardiovascular: RRR,  S1 S2 auscultated, no rubs, murmurs or gallops.   Respiratory: Clear to auscultation bilaterally with no wheezes, rhonchi or rales  Abdomen:  Thin, soft, nt, nd, +bs Psych: Exam limited by patient sleeping. Upon awakening, patient pleasant affect and demeanor with obvious lack of insight and judgement. Patient able to state his name, but not his birth date.   Data Reviewed: Basic Metabolic Panel:  Recent Labs Lab 06/21/14 0630 06/22/14 0638 06/23/14 0615 06/25/14 1152  NA 144 143 147 146  K 3.8 3.6* 4.0 3.3*  CL 104 105 114* 109  CO2 24 27 23 26   GLUCOSE 164* 96 90 93  BUN 21 16 12 13   CREATININE 0.99 0.78 0.88 1.04  CALCIUM 9.7 9.3 9.6 9.4   Liver Function Tests:  Recent Labs Lab 06/21/14 0630 06/22/14 0638  AST 18 17  ALT <5 <5  ALKPHOS 87 71  BILITOT 0.3 0.3  PROT 6.9 5.9*  ALBUMIN 3.4* 2.9*   CBC:  Recent Labs Lab 06/21/14 0630 06/23/14 0615 06/25/14 1152  WBC 10.9* 7.9 7.0  NEUTROABS 9.5*  --   --   HGB 13.9 12.2* 12.8*  HCT 43.4 38.1* 39.7  MCV 90.0 89.9 87.3  PLT 139* 183 179   BNP (last 3 results)  Recent Labs  03/02/14 0503  PROBNP 1416.0*   CBG:  Recent Labs Lab 06/21/14 0643  GLUCAP 144*    Recent Results (from the past 240 hour(s))  CULTURE, BLOOD (ROUTINE X 2)     Status: None   Collection Time    06/21/14  6:30 AM      Result Value Ref Range Status   Specimen Description BLOOD FOREARM RIGHT   Final   Special Requests     Final   Value: BOTTLES DRAWN AEROBIC AND ANAEROBIC BLUE 3ML RED 4ML   Culture  Setup Time     Final   Value: 06/21/2014 13:12     Performed at [facility]   Culture     Final   Value:        BLOOD CULTURE RECEIVED NO GROWTH TO DATE CULTURE WILL BE HELD FOR 5 DAYS BEFORE ISSUING A FINAL NEGATIVE REPORT     Performed at [facility]   Report Status PENDING   Incomplete  CULTURE, BLOOD (ROUTINE X 2)     Status: None   Collection Time    06/21/14  8:20 AM      Result Value Ref Range Status     Specimen Description BLOOD RIGHT ANTECUBITAL   Final   Special Requests BOTTLES DRAWN AEROBIC AND ANAEROBIC 5ML   Final   Culture  Setup Time     Final   Value: 06/21/2014 13:12     Performed at [facility]   Culture     Final   Value:  BLOOD CULTURE RECEIVED NO GROWTH TO DATE CULTURE WILL BE HELD FOR 5 DAYS BEFORE ISSUING A FINAL NEGATIVE REPORT     Performed at [facility]   Report Status PENDING   Incomplete  URINE CULTURE     Status: None   Collection Time    06/21/14 11:11 AM      Result Value Ref Range Status   Specimen Description URINE, CATHETERIZED   Final   Special Requests NONE   Final   Culture  Setup Time     Final   Value: 06/21/2014 17:18     Performed at [place]     Final   Value: NO GROWTH     Performed at [facility]   Culture     Final   Value: NO GROWTH     Performed at [facility]   Report Status 06/23/2014 FINAL   Final     Studies: No results found.  Scheduled Meds: . amLODipine  2.5 mg Oral Daily  . carbidopa-levodopa  1 tablet Oral TID  . donepezil  5 mg Oral Daily  . feeding supplement (ENSURE)  1 Container Oral TID BM  . heparin  5,000 Units Subcutaneous 3 times per day  . nystatin cream   Topical TID  . piperacillin-tazobactam (ZOSYN)  IV  3.375 g Intravenous 3 times per day  . potassium chloride  40 mEq Oral Once   Continuous Infusions:   Principal Problem:   Aspiration pneumonia Active Problems:   Dementia with behavioral disturbance   Acute respiratory failure with hypoxia   Acute encephalopathy   Leukocytosis   Protein-calorie malnutrition, severe   Groin rash  Waylan Rocher, PA-S2 Imogene Burn, PA-C [facility] Pager [phone]. If 7PM-7AM, please contact night-coverage at [email or web address], password [facility] 06/25/2014, 1:20 PM  LOS: 4 days   Patient was seen, examined,treatment plan was discussed with the Physician extender. I have directly reviewed the  clinical findings, lab, imaging studies and management of this patient in detail. I have made the necessary changes to the above noted documentation, and agree with the documentation, as recorded by the Physician extender.  Phillips Climes MD [facility].

## 2014-06-25 NOTE — Progress Notes (Signed)
Utilization review completed.  

## 2014-06-26 LAB — BASIC METABOLIC PANEL
Anion gap: 9 (ref 5–15)
BUN: 14 mg/dL (ref 6–23)
CALCIUM: 9.4 mg/dL (ref 8.4–10.5)
CO2: 26 meq/L (ref 19–32)
CREATININE: 0.96 mg/dL (ref 0.50–1.35)
Chloride: 109 mEq/L (ref 96–112)
GFR calc Af Amer: 83 mL/min — ABNORMAL LOW (ref >90)
GFR, EST NON AFRICAN AMERICAN: 71 mL/min — AB (ref >90)
GLUCOSE: 95 mg/dL (ref 70–99)
Potassium: 3.7 mEq/L (ref 3.7–5.3)
Sodium: 144 mEq/L (ref 137–147)

## 2014-06-26 MED ORDER — NYSTATIN 100000 UNIT/GM EX CREA: TOPICAL_CREAM | Freq: Three times a day (TID) | CUTANEOUS | Status: AC

## 2014-06-26 MED ORDER — AMLODIPINE BESYLATE 2.5 MG PO TABS: 2.5000 mg | ORAL_TABLET | Freq: Every day | ORAL | Status: AC

## 2014-06-26 MED ORDER — QUETIAPINE 12.5 MG HALF TABLET: 12.5000 mg | ORAL_TABLET | Freq: Every evening | ORAL | Status: AC | PRN

## 2014-06-26 MED ORDER — ENSURE PUDDING PO PUDG: 1.0000 | Freq: Three times a day (TID) | ORAL | Status: AC

## 2014-06-26 MED ORDER — AMOXICILLIN-POT CLAVULANATE 875-125 MG PO TABS: 1.0000 | ORAL_TABLET | Freq: Two times a day (BID) | ORAL | Status: AC

## 2014-06-26 NOTE — Progress Notes (Signed)
NURSING PROGRESS NOTE  [name] [name] [id number] Discharge Data: 06/26/2014 3:09 PM Attending Provider: No att. providers found [id number],KVQ [name], MD     [name] to be D/C'd Home per MD order.  Discussed with the patient the After Visit Summary and all questions fully answered. All IV's discontinued with no bleeding noted. All belongings returned to patient for patient to take home.   Last Vital Signs:  Blood pressure 174/64, pulse 61, temperature 98.3 F (36.8 C), temperature source Oral, resp. rate 20, height 5\' 8"  (1.727 m), weight 62.37 kg (137 lb 8 oz), SpO2 99.00%.  Discharge Medication List   Medication List         acetaminophen 325 MG tablet  Commonly known as:  TYLENOL  Take 650 mg by mouth daily as needed for headache (pain).     amLODipine 2.5 MG tablet  Commonly known as:  NORVASC  Take 1 tablet (2.5 mg total) by mouth daily. Take only if BP is elevated greater than 175.     amoxicillin-clavulanate 875-125 MG per tablet  Commonly known as:  AUGMENTIN  Take 1 tablet by mouth 2 (two) times daily.     carbidopa-levodopa 25-100 MG per tablet  Commonly known as:  SINEMET IR  Take 1 tablet by mouth 3 (three) times daily. [id number]     feeding supplement (ENSURE) Pudg  Take 1 Container by mouth 3 (three) times daily between meals.     fluconazole 100 MG tablet  Commonly known as:  DIFLUCAN  Take 100 mg by mouth daily. Finished 5 day couse     nystatin cream  Commonly known as:  MYCOSTATIN  Apply topically 3 (three) times daily.     polyethylene glycol packet  Commonly known as:  MIRALAX / GLYCOLAX  Take 17 g by mouth daily as needed for mild constipation.     QUEtiapine 12.5 mg Tabs tablet  Commonly known as:  SEROQUEL  Take 0.5 tablets (12.5 mg total) by mouth at bedtime as needed (agitation).

## 2014-06-26 NOTE — Discharge Instructions (Signed)
Please complete the antibiotic, and use aspiration precautions when eating.  [name] [name] will likely continue to aspirate and continue to have pneumonia as a result.

## 2014-06-26 NOTE — Discharge Summary (Signed)
Physician Discharge Summary  [name] [name] PRF:[id number] DOB: [birth year] DOA: 06/21/2014  PCP: Mathews Argyle, MD  Admit date: 06/21/2014 Discharge date: 06/26/2014  Time spent: 45 minutes  Recommendations for Outpatient Follow-up:  1. Bmet, Cbc at hospital follow up in 1 week. 2. When appropriate consider Goals of Care discussion as he will continue to aspirate and return to the hospital with pna. 3. When appropriate consider Hospice referral. 4. Home Health RN, PT, Aide, SW ordered. 5. Wife politely refuses placement.  Discharge Diagnoses:  Principal Problem:   Aspiration pneumonia Active Problems:   Dementia with behavioral disturbance   Acute respiratory failure with hypoxia   Acute encephalopathy   Leukocytosis   Protein-calorie malnutrition, severe   Groin rash   Discharge Condition: stable.  Diet recommendation: pureed diet, thin liquids, aspiration precautions.  Filed Weights   06/21/14 1132  Weight: 62.37 kg (137 lb 8 oz)    History of present illness:  [name] [name] is a 80 y.o. male with history of dementia, parkinson's, and recent admission for pneumonia (discharge 03/08/14) who presented with weakness and rapid breathing. Dysphagia 1 diet had been recommended during last hospitalization after a swallowing evaluation. Per the patient's wife and daughter, the patient has been slowly declining and has frequent episodes of coughing when he eats. Chest X-ray revealed probable aspiration pneumonia.   Hospital Course:  Acute respiratory failure with hypoxia  -Right basilar airspace opacity on CXR  -Likely secondary to aspiration pneumonia  -resolved.  Patient in no respiratory distress.  Oxygen sats are good on room air.  Pneumonia  -afebrile, no leukocytosis- in no respiratory distress.  -on Zosyn x 4 days inpatient.  Will be discharged on Augmentin to complete the antibiotic course. -Swallowing study completed- dysphagia 2, liquid diet  -Blood cultures  showing no growth to date  -PT recommendation of SNF but wife is very definite about taking him home   Hypetension  - Allowed permissive hypertension up to 180s, as patient rapidly becomes hypotensive when given medication per wife. -Treated with low dose amlodipine and hydralazine PRN.  Hypokalemia  -Replaced, 3.7 on discharge  Acute encephalopathy Dementia with behavioral disturbance  -Increased confusion probably secondary to pneumonia. Now resolved. -UA negative, urine cultures show no growth at this time  -Continue Aricept  -Seroquel added QHS when necessary   Advanced Parkinsons  -progressive  -Continue Sinemet  -PT recommends SNF. Wife politely refuses.   Groin Rash  -small confined area in penile region with erythema. No wound, discharge, or satellite lesions  -nystatin cream TID   Protein-calorie malnutrition, severe  - Ensure 3 times dailt   Procedures:  Speech Eval.  Consultations:  none  Discharge Exam:  Filed Vitals:   06/25/14 2141 06/26/14 0558 06/26/14 0812 06/26/14 1334  BP: 159/53 199/65 149/64 174/64  Pulse: 62 65 63 61  Temp: 97.7 F (36.5 C) 98 F (36.7 C)  98.3 F (36.8 C)  TempSrc: Oral Oral  Oral  Resp: 20 20  20   Height:      Weight:      SpO2: 98% 97%  99%    General: thin, elderly male, awake, alert, in NAD.  Dtr [name] [name] at bedside.  Cardiovascular: RRR, S1 S2 auscultated, no rubs, murmurs or gallops.  Respiratory: Clear to auscultation bilaterally with no wheezes, rhonchi or rales  Abdomen: Thin, soft, nt, nd, +bs  Psych: A&O, +sense of humor!, Cooperative.  Hard of hearing.    Discharge Instructions   Discharge Instructions   Diet general  Complete by:  As directed   Puree diet.  With Aspiration precautions:  1.  Sit up while eating.  2.  Take small bites, swallow twice and take a sip of water after each bite.     Increase activity slowly    Complete by:  As directed           Current Discharge Medication List     START taking these medications   Details  amoxicillin-clavulanate (AUGMENTIN) 875-125 MG per tablet Take 1 tablet by mouth 2 (two) times daily. Qty: 12 tablet, Refills: 0    feeding supplement, ENSURE, (ENSURE) PUDG Take 1 Container by mouth 3 (three) times daily between meals. Qty: 90 Container, Refills: 10    nystatin cream (MYCOSTATIN) Apply topically 3 (three) times daily. Qty: 30 g, Refills: 0    QUEtiapine (SEROQUEL) 12.5 mg TABS tablet Take 0.5 tablets (12.5 mg total) by mouth at bedtime as needed (agitation). Qty: 30 tablet, Refills: 1      CONTINUE these medications which have CHANGED   Details  amLODipine (NORVASC) 2.5 MG tablet Take 1 tablet (2.5 mg total) by mouth daily. Take only if BP is elevated greater than 175. Qty: 30 tablet, Refills: 0      CONTINUE these medications which have NOT CHANGED   Details  acetaminophen (TYLENOL) 325 MG tablet Take 650 mg by mouth daily as needed for headache (pain).    carbidopa-levodopa (SINEMET IR) 25-100 MG per tablet Take 1 tablet by mouth 3 (three) times daily. 0700,1200,1900    polyethylene glycol (MIRALAX / GLYCOLAX) packet Take 17 g by mouth daily as needed for mild constipation.    fluconazole (DIFLUCAN) 100 MG tablet Take 100 mg by mouth daily. Finished 5 day couse      STOP taking these medications     donepezil (ARICEPT) 10 MG tablet      senna-docusate (SENOKOT-S) 8.6-50 MG per tablet        Allergies  Allergen Reactions  . Aceon [Perindopril]     diarrhea  . Morphine And Related Other (See Comments)    Causes confusion   Follow-up Information   Follow up with Mathews Argyle, MD In 1 week. (Appointment with Dr. Felipa Eth patient will have to call left message at doctor office)    Specialty:  Internal Medicine   Contact information:   [address] [place] [address] [phone]       Follow up with [facility]. [facility] Physical Therapy, Social Worker, Speech  Therapy, RN and aide. )    Contact information:   [address] [place] [address] [phone]        The results of significant diagnostics from this hospitalization (including imaging, microbiology, ancillary and laboratory) are listed below for reference.    Significant Diagnostic Studies: Dg Chest Port 1 View  06/21/2014   CLINICAL DATA:  Altered mental status.  EXAM: PORTABLE CHEST - 1 VIEW  COMPARISON:  Chest radiograph performed 03/05/2014  FINDINGS: The lungs are well-aerated. Mild right basilar airspace opacity could reflect mild pneumonia. Pulmonary vascularity is at the upper limits of normal. No pleural effusion or pneumothorax is seen.  The cardiomediastinal silhouette is borderline normal in size. Calcification is noted within the aortic arch. No acute osseous abnormalities are seen.  IMPRESSION: Mild right basilar airspace opacity could reflect mild pneumonia.   Electronically Signed   By: Garald Balding M.D.   On: 06/21/2014 06:58   Dg Swallowing Func-speech  Pathology  06/22/2014   Houston Siren, CCC-SLP     06/22/2014 11:40 AM Objective Swallowing Evaluation: Modified Barium Swallowing Study   Patient Details  Name: DAYDEN VIVERETTE MRN: [id number] Date of Birth: 1923-05-24  Today's Date: 06/22/2014 Time: 1015-1055 SLP Time Calculation (min): 40 min  Past Medical History:  Past Medical History  Diagnosis Date  . Hypertension   . Hyperlipidemia   . Carotid artery occlusion   . AAA (abdominal aortic aneurysm)   . Valvular heart disease   . Hyperlipidemia   . Cancer     "skin cancer removed from top of head"  . Arthritis     "in his spine"  . Refusal of blood transfusions as patient is Jehovah's Witness  08/31/11  . PTSD (post-traumatic stress disorder)   . Depression     "terrible; from the PTSD"  . Anxiety   . Nightmares   . Spinal stenosis   . Memory loss   . Osteoarthritis     bilateral knees  . Aortic sclerosis     insufficiency mild echo, 10/2007  .  Hypercholesteremia   . Renal disease     stage lll  . Dementia, vascular Oct. 2013  . Atrial fibrillation   . Back pain   . Thyrotoxicosis     without mention of goiter or other cause, without mention of  thyrotixic crisis or storm  . Orthostatic hypotension   . Cardiac dysrhythmia   . Kidney disorder   . Ureteral disorder   . Movement disorder   . 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 Transfusion of blood product refused for religious reason   . Neuromuscular disorder     parkinsons  . Macular degeneration    Past Surgical History:  Past Surgical History  Procedure Laterality Date  . Wrist surgery  2003    left; S/P fracture; "put a plate in it"  . Abdominal aortic aneurysm repair  2002  . Carotid endarterectomy  2004    left  . Total knee arthroplasty  2000's    bilaterally  . Cataract extraction w/ intraocular lens  implant, bilateral  ~  2010  . Joint replacement  2003 and 2004    Right and left knee  . Replacement total knee  12/2006  . Eye surgery    . Fracture surgery Left     wrist   HPI:  80 y.o. with past medical history of dementia, Parkinson, stroke  x3. PTSD, spinal stenosis, renal disease.  Per MD noted pt  discharged from the hospital more than 3 months ago for  community-acquired pneumonia.  MBS4/28/15 revealed trace  aspiration of thin via straw noted - small single boluses not  aspirated. Decreased UES opening due to parkinsons's also  contributed to pharyngeal stasis.  Dys 3 diet and thin liquids  recommended.  CXR Mild right basilar airspace opacity could  reflect mild pneumonia.      Assessment / Plan / Recommendation Clinical Impression  Dysphagia Diagnosis: Mild oral phase dysphagia;Severe pharyngeal  phase dysphagia Clinical impression: Results of MBS are similar to results of MBS  on 01/30/14 (mildy worse).  Significantly decreased tongue base  retraction and laryneal elevation continue to result in maximum  vallecular and pyriform sinus residue with inability to elicit  volitional swallows during study.   Laryngeal penetration observed  with honey, nectar and thin liquids during but mostly after the  swallow from residuals.   Multiple strategies introduced to  decrease residue and  aid in airway protection were ineffective  due to pt.'s weakness and inability to initiate dry swallows.   Wife present, educated with discussion of previous and current  MBS and recommendations.  Unfortunately aspiration risk is high  and imminent with all po's.  Wife is aware of risks (as with  previous MBS) and understandably prefers po's for quality of  life. SLP discussed that pt. will likely continue to have pna.  Wife stated that "the code status is wrong in his chart" and  would like to speak with hospital MD to clarify.  Wife familiar  with Palliative care from daughter who passed away last year.   Would Palliative care be beneficial for Mr. Burkett?  SP  recommending continue with Dys 2 diet texture and upgrade liquids  to thin (nectar = excessive residue), sit upright, small sips, no  straws, pt. to attempt 2 swallows and volitional coughs, crushed  pills.       Treatment Recommendation  Therapy as outlined in treatment plan below    Diet Recommendation Dysphagia 2 (Fine chop);Thin liquid   Liquid Administration via: Cup;No straw Medication Administration: Crushed with puree Supervision: Full supervision/cueing for compensatory  strategies;Staff to assist with self feeding Compensations: Slow rate;Small sips/bites;Check for  pocketing;Multiple dry swallows after each bite/sip;Hard cough  after swallow Postural Changes and/or Swallow Maneuvers: Seated upright 90  degrees;Upright 30-60 min after meal    Other  Recommendations Oral Care Recommendations: Oral care BID   Follow Up Recommendations   (TBD)    Frequency and Duration min 1 x/week  2 weeks   Pertinent Vitals/Pain No pain           Reason for Referral Objectively evaluate swallowing function   Oral Phase Oral Preparation/Oral Phase Oral Phase: Impaired Oral - Honey Oral -  Honey Cup: Delayed oral transit;Weak lingual manipulation Oral - Solids Oral - Puree: Weak lingual manipulation;Delayed oral transit   Pharyngeal Phase Pharyngeal Phase Pharyngeal Phase: Impaired Pharyngeal - Honey Pharyngeal - Honey Teaspoon: Pharyngeal residue -  valleculae;Pharyngeal residue - pyriform sinuses;Reduced tongue  base retraction;Reduced laryngeal elevation (unable to initiate  second swallow) Pharyngeal - Nectar Pharyngeal - Nectar Teaspoon: Pharyngeal residue -  valleculae;Reduced tongue base retraction;Penetration/Aspiration  during swallow Penetration/Aspiration details (nectar teaspoon): Material enters  airway, remains ABOVE vocal cords and not ejected out Pharyngeal - Nectar Cup: Pharyngeal residue -  valleculae;Pharyngeal residue - pyriform sinuses;Reduced tongue  base retraction;Reduced laryngeal  elevation;Penetration/Aspiration during swallow Penetration/Aspiration details (nectar cup): Material enters  airway, CONTACTS cords and not ejected out;Material enters  airway, passes BELOW cords without attempt by patient to eject  out (silent aspiration) Pharyngeal - Thin Pharyngeal - Thin Teaspoon: Pharyngeal residue -  valleculae;Pharyngeal residue - pyriform sinuses;Reduced  laryngeal elevation;Reduced tongue base  retraction;Penetration/Aspiration during swallow Penetration/Aspiration details (thin teaspoon): Material enters  airway, CONTACTS cords and not ejected out Pharyngeal - Thin Cup: Pharyngeal residue - pyriform  sinuses;Pharyngeal residue - valleculae;Reduced tongue base  retraction;Reduced laryngeal elevation;Penetration/Aspiration  during swallow Penetration/Aspiration details (thin cup): Material enters  airway, passes BELOW cords without attempt by patient to eject  out (silent aspiration);Material enters airway, CONTACTS cords  and not ejected out Pharyngeal - Solids Pharyngeal - Puree: Pharyngeal residue - valleculae;Reduced  tongue base retraction  Cervical Esophageal Phase     GO    Cervical Esophageal Phase Cervical Esophageal Phase: Darryll Capers         Houston Siren 06/22/2014, 11:39 AM  Orbie Pyo Colvin [employer] [phone]  Microbiology: Recent Results (from the past 240 hour(s))  CULTURE, BLOOD (ROUTINE X 2)     Status: None   Collection Time    06/21/14  6:30 AM      Result Value Ref Range Status   Specimen Description BLOOD FOREARM RIGHT   Final   Special Requests     Final   Value: BOTTLES DRAWN AEROBIC AND ANAEROBIC BLUE 3ML RED 4ML   Culture  Setup Time     Final   Value: 06/21/2014 13:12     Performed at [facility]   Culture     Final   Value:        BLOOD CULTURE RECEIVED NO GROWTH TO DATE CULTURE WILL BE HELD FOR 5 DAYS BEFORE ISSUING A FINAL NEGATIVE REPORT     Performed at [facility]   Report Status PENDING   Incomplete  CULTURE, BLOOD (ROUTINE X 2)     Status: None   Collection Time    06/21/14  8:20 AM      Result Value Ref Range Status   Specimen Description BLOOD RIGHT ANTECUBITAL   Final   Special Requests BOTTLES DRAWN AEROBIC AND ANAEROBIC 5ML   Final   Culture  Setup Time     Final   Value: 06/21/2014 13:12     Performed at [facility]   Culture     Final   Value:        BLOOD CULTURE RECEIVED NO GROWTH TO DATE CULTURE WILL BE HELD FOR 5 DAYS BEFORE ISSUING A FINAL NEGATIVE REPORT     Performed at [facility]   Report Status PENDING   Incomplete  URINE CULTURE     Status: None   Collection Time    06/21/14 11:11 AM      Result Value Ref Range Status   Specimen Description URINE, CATHETERIZED   Final   Special Requests NONE   Final   Culture  Setup Time     Final   Value: 06/21/2014 17:18     Performed at [place]     Final   Value: NO GROWTH     Performed at [facility]   Culture     Final   Value: NO GROWTH     Performed at [facility]   Report Status 06/23/2014 FINAL   Final     Labs: Basic Metabolic Panel:  Recent  Labs Lab 06/21/14 0630 06/22/14 0638 06/23/14 0615 06/25/14 1152 06/26/14 0812  NA 144 143 147 146 144  K 3.8 3.6* 4.0 3.3* 3.7  CL 104 105 114* 109 109  CO2 24 27 23 26 26   GLUCOSE 164* 96 90 93 95  BUN 21 16 12 13 14   CREATININE 0.99 0.78 0.88 1.04 0.96  CALCIUM 9.7 9.3 9.6 9.4 9.4   Liver Function Tests:  Recent Labs Lab 06/21/14 0630 06/22/14 0638  AST 18 17  ALT <5 <5  ALKPHOS 87 71  BILITOT 0.3 0.3  PROT 6.9 5.9*  ALBUMIN 3.4* 2.9*   CBC:  Recent Labs Lab 06/21/14 0630 06/23/14 0615 06/25/14 1152  WBC 10.9* 7.9 7.0  NEUTROABS 9.5*  --   --   HGB 13.9 12.2* 12.8*  HCT 43.4 38.1* 39.7  MCV 90.0 89.9 87.3  PLT 139* 183 179    BNP (last 3 results)  Recent Labs  03/02/14 0503  PROBNP 1416.0*   CBG:  Recent Labs Lab 06/21/14  [place]       Signed:  Karen Kitchens [phone]  [facility] 06/26/2014, 2:29 PM   Patient was seen, examined,treatment plan was discussed with the Physician extender. I have directly reviewed the clinical findings, lab, imaging studies and management of this patient in detail. I have made the necessary changes to the above noted documentation, and agree with the documentation, as recorded by the Physician extender.  Phillips Climes MD [facility].

## 2014-06-26 NOTE — Progress Notes (Signed)
CARE MANAGEMENT NOTE 06/26/2014  Patient:  [name] [name], [name] [name]   Account Number:  [id number]  Date Initiated:  06/25/2014  Documentation initiated by:  [facility]  Subjective/Objective Assessment:   CAP     Action/Plan:   lives at home with wife   Anticipated DC Date:  06/26/2014   Anticipated DC Plan:  [place]  CM consult      [facility] Choice  HOME HEALTH   Choice offered to / List presented to:  C-3 Spouse        HH arranged  HH-2 PT  HH-1 RN  HH-5 SPEECH THERAPY  HH-6 SOCIAL WORKER  HH-4 NURSE'S AIDE      Status of service:  Completed, signed off Medicare Important Message given?  YES (If response is "NO", the following Medicare IM given date fields will be blank) Date Medicare IM given:  06/25/2014 Medicare IM given by:  Tomi Bamberger Date Additional Medicare IM given:   Additional Medicare IM given by:    Discharge Disposition:  HOME/SELF CARE  Per UR Regulation:    If discussed at Long Length of Stay Meetings, dates discussed:    Comments:  06/26/2014 1130 NCM spoke to dtr, [name] [name], # [phone]. Offered choice for [facility] and provided with list of private duty caregivers. Dtr states pt has VA benefits and will call to see if private duty caregiver is covered under his VA benefits. They are requesting Arville Go for [facility]. Notified [facility] for scheduled dc today with HH. [facility] delivered 3n1 to the room.  06/25/2014 1500 NCM spoke to pt and offered choice for [facility]. Wife states she has a friend that is a Tax inspector that comes every morning to check on pt. She also has a friend that is Physical Therapist that comes once per week to assist with pt. States at this time she does not want HH. Wife reports pt has RW, wheelchair, and stair lift at home. Requesting 3n1 for home. Notified [facility] for 3n1 for home. Jonnie Finner RN  CCM Case Mgmt phone [phone]

## 2014-06-26 NOTE — Clinical Social Work Note (Signed)
Per MD patient ready to DC back to home. RN, patient/family notified of patient's DC. DC packet on patient's chart. Ambulance transport requested for patient. CSW signing off at this time.  Liz Beach MSW, [place], [place], [phone]

## 2014-06-27 LAB — CULTURE, BLOOD (ROUTINE X 2)
Culture: NO GROWTH
Culture: NO GROWTH

## 2014-07-18 ENCOUNTER — Telehealth: Payer: Self-pay | Admitting: Family

## 2014-07-18 ENCOUNTER — Telehealth: Payer: Self-pay

## 2014-07-18 NOTE — Telephone Encounter (Signed)
Patient died @ Home per [facility] °

## 2014-07-18 NOTE — Telephone Encounter (Signed)
Patients wife called to cancel appointments for 2014/08/11, as [name] [name] passed away on 07-19-2014. All appointments have been canceled.  Thank you.

## 2014-08-03 ENCOUNTER — Ambulatory Visit: Payer: Federal, State, Local not specified - PPO | Admitting: Family

## 2014-08-03 ENCOUNTER — Other Ambulatory Visit (HOSPITAL_COMMUNITY): Payer: Federal, State, Local not specified - PPO

## 2014-08-05 DEATH — deceased
